# Patient Record
Sex: Female | Born: 1950 | Race: White | Hispanic: No | Marital: Single | Smoking: Never smoker
Health system: Southern US, Community
[De-identification: ages and names within clinical notes are randomized; demographics above are authoritative.]

## PROBLEM LIST (undated history)

## (undated) DIAGNOSIS — F32A Depression, unspecified: Secondary | ICD-10-CM

## (undated) DIAGNOSIS — K219 Gastro-esophageal reflux disease without esophagitis: Secondary | ICD-10-CM

## (undated) DIAGNOSIS — K635 Polyp of colon: Secondary | ICD-10-CM

## (undated) DIAGNOSIS — E785 Hyperlipidemia, unspecified: Secondary | ICD-10-CM

## (undated) DIAGNOSIS — G4700 Insomnia, unspecified: Secondary | ICD-10-CM

## (undated) DIAGNOSIS — H35041 Retinal micro-aneurysms, unspecified, right eye: Secondary | ICD-10-CM

## (undated) DIAGNOSIS — M419 Scoliosis, unspecified: Secondary | ICD-10-CM

## (undated) DIAGNOSIS — M1990 Unspecified osteoarthritis, unspecified site: Secondary | ICD-10-CM

## (undated) DIAGNOSIS — K5790 Diverticulosis of intestine, part unspecified, without perforation or abscess without bleeding: Secondary | ICD-10-CM

## (undated) HISTORY — DX: Polyp of colon: K63.5

## (undated) HISTORY — DX: Depression, unspecified: F32.A

## (undated) HISTORY — DX: Diverticulosis of intestine, part unspecified, without perforation or abscess without bleeding: K57.90

## (undated) HISTORY — PX: ABDOMINAL HYSTERECTOMY: SHX81

## (undated) HISTORY — DX: Insomnia, unspecified: G47.00

## (undated) HISTORY — PX: COLONOSCOPY: SHX174

## (undated) HISTORY — DX: Scoliosis, unspecified: M41.9

## (undated) HISTORY — DX: Retinal micro-aneurysms, unspecified, right eye: H35.041

## (undated) HISTORY — DX: Gastro-esophageal reflux disease without esophagitis: K21.9

## (undated) HISTORY — DX: Unspecified osteoarthritis, unspecified site: M19.90

## (undated) HISTORY — DX: Hyperlipidemia, unspecified: E78.5

---

## 2000-12-07 HISTORY — PX: ABDOMINAL HYSTERECTOMY: SHX81

## 2002-12-07 HISTORY — PX: COLONOSCOPY: SHX174

## 2007-02-21 ENCOUNTER — Ambulatory Visit: Payer: Self-pay | Admitting: Family Medicine

## 2007-03-10 ENCOUNTER — Ambulatory Visit: Payer: Self-pay | Admitting: Gastroenterology

## 2007-03-10 LAB — CONVERTED CEMR LAB
BUN: 9 mg/dL (ref 6–23)
Basophils Absolute: 0 10*3/uL (ref 0.0–0.1)
Basophils Relative: 0.3 % (ref 0.0–1.0)
CO2: 34 meq/L — ABNORMAL HIGH (ref 19–32)
Creatinine, Ser: 0.7 mg/dL (ref 0.4–1.2)
GFR calc Af Amer: 112 mL/min
HCT: 38.3 % (ref 36.0–46.0)
Hemoglobin: 13 g/dL (ref 12.0–15.0)
Lymphocytes Relative: 26.3 % (ref 12.0–46.0)
MCHC: 34 g/dL (ref 30.0–36.0)
Monocytes Absolute: 0.3 10*3/uL (ref 0.2–0.7)
Monocytes Relative: 9.5 % (ref 3.0–11.0)
Neutro Abs: 1.8 10*3/uL (ref 1.4–7.7)
Neutrophils Relative %: 58.9 % (ref 43.0–77.0)
Potassium: 4.7 meq/L (ref 3.5–5.1)
Sodium: 143 meq/L (ref 135–145)

## 2007-04-15 ENCOUNTER — Encounter: Payer: Self-pay | Admitting: Gastroenterology

## 2007-04-15 ENCOUNTER — Ambulatory Visit: Payer: Self-pay | Admitting: Gastroenterology

## 2007-04-15 DIAGNOSIS — K219 Gastro-esophageal reflux disease without esophagitis: Secondary | ICD-10-CM

## 2007-06-27 ENCOUNTER — Ambulatory Visit: Payer: Self-pay | Admitting: Gynecology

## 2007-08-19 ENCOUNTER — Ambulatory Visit: Payer: Self-pay | Admitting: Family Medicine

## 2007-08-19 DIAGNOSIS — G4700 Insomnia, unspecified: Secondary | ICD-10-CM

## 2007-08-19 DIAGNOSIS — G2581 Restless legs syndrome: Secondary | ICD-10-CM | POA: Insufficient documentation

## 2007-08-24 ENCOUNTER — Encounter: Payer: Self-pay | Admitting: Family Medicine

## 2007-08-25 DIAGNOSIS — K5730 Diverticulosis of large intestine without perforation or abscess without bleeding: Secondary | ICD-10-CM | POA: Insufficient documentation

## 2007-09-07 ENCOUNTER — Emergency Department: Payer: Self-pay | Admitting: Emergency Medicine

## 2007-09-07 ENCOUNTER — Other Ambulatory Visit: Payer: Self-pay

## 2007-09-22 ENCOUNTER — Ambulatory Visit: Payer: Self-pay | Admitting: Family Medicine

## 2007-12-12 ENCOUNTER — Ambulatory Visit: Payer: Self-pay | Admitting: Family Medicine

## 2007-12-12 DIAGNOSIS — M545 Low back pain: Secondary | ICD-10-CM

## 2008-04-05 ENCOUNTER — Encounter: Admission: RE | Admit: 2008-04-05 | Discharge: 2008-04-05 | Payer: Self-pay | Admitting: Family Medicine

## 2008-06-07 ENCOUNTER — Encounter: Admission: RE | Admit: 2008-06-07 | Discharge: 2008-06-07 | Payer: Self-pay | Admitting: Internal Medicine

## 2009-03-16 ENCOUNTER — Ambulatory Visit: Payer: Self-pay | Admitting: Interventional Radiology

## 2009-03-16 ENCOUNTER — Emergency Department (HOSPITAL_BASED_OUTPATIENT_CLINIC_OR_DEPARTMENT_OTHER): Admission: EM | Admit: 2009-03-16 | Discharge: 2009-03-16 | Payer: Self-pay | Admitting: Emergency Medicine

## 2009-04-03 ENCOUNTER — Encounter: Admission: RE | Admit: 2009-04-03 | Discharge: 2009-04-03 | Payer: Self-pay | Admitting: Family Medicine

## 2009-05-09 ENCOUNTER — Encounter: Admission: RE | Admit: 2009-05-09 | Discharge: 2009-05-09 | Payer: Self-pay | Admitting: Family Medicine

## 2010-07-14 ENCOUNTER — Encounter (INDEPENDENT_AMBULATORY_CARE_PROVIDER_SITE_OTHER): Payer: Self-pay | Admitting: *Deleted

## 2011-01-06 NOTE — Letter (Signed)
Summary: Nadara Eaton letter  [place] at [facility]  [address] [place], [place] [address]   Phone: [phone]  Fax: [phone]       07/14/2010 MRN: [id number]  Lindsay Reyes [address] [place], [place]  [address]  Dear Ms. [facility] [place] [place] announce the retirement of Arta Silence, M.D., from full-time practice at the [facility] office effective June 05, 2010 and his plans of returning part-time.  It is important to Dr. Hetty Ely and to our practice that you understand that [facility] has seven physicians in our office for your health care needs.  We will continue to offer the same exceptional care that you have today.    Dr. Hetty Ely has spoken to many of you about his plans for retirement and returning part-time in the fall.   We will continue to work with you through the transition to schedule appointments for you in the office and meet the high standards that [facility] is committed to.   Again, it is with great pleasure that we share the news that Dr. Hetty Ely will return to [facility] at [facility] in October of 2011 with a reduced schedule.    If you have any questions, or would like to request an appointment with one of our physicians, please call us at [phone] and press the option for Scheduling an appointment.  We take pleasure in providing you with excellent patient care and look forward to seeing you at your next office visit.  Our [facility] Physicians are:  Tillman Abide, M.D. Laurita Quint, M.D. Roxy Manns, M.D. Kerby Nora, M.D. Hannah Beat, M.D. Ruthe Mannan, M.D. We proudly welcomed Raechel Ache, M.D. and Eustaquio Boyden, M.D. to the practice in July/August 2011.  Sincerely,  [facility]

## 2011-03-18 LAB — CBC
HCT: 36.9 % (ref 36.0–46.0)
Hemoglobin: 12.6 g/dL (ref 12.0–15.0)
RBC: 3.96 MIL/uL (ref 3.87–5.11)
WBC: 5.7 10*3/uL (ref 4.0–10.5)

## 2011-03-18 LAB — COMPREHENSIVE METABOLIC PANEL
ALT: 13 U/L (ref 0–35)
Alkaline Phosphatase: 79 U/L (ref 39–117)
BUN: 11 mg/dL (ref 6–23)
CO2: 25 mEq/L (ref 19–32)
Chloride: 104 mEq/L (ref 96–112)
GFR calc non Af Amer: >60 mL/min (ref >60)
Glucose, Bld: 82 mg/dL (ref 70–99)
Potassium: 3.6 mEq/L (ref 3.5–5.1)
Sodium: 139 mEq/L (ref 135–145)
Total Bilirubin: 0.9 mg/dL (ref 0.3–1.2)
Total Protein: 7.1 g/dL (ref 6.0–8.3)

## 2011-03-18 LAB — DIFFERENTIAL
Basophils Absolute: 0 10*3/uL (ref 0.0–0.1)
Basophils Relative: 1 % (ref 0–1)
Eosinophils Absolute: 0.1 10*3/uL (ref 0.0–0.7)
Monocytes Relative: 9 % (ref 3–12)
Neutro Abs: 4.3 10*3/uL (ref 1.7–7.7)
Neutrophils Relative %: 74 % (ref 43–77)

## 2011-03-18 LAB — URINALYSIS, ROUTINE W REFLEX MICROSCOPIC
Glucose, UA: NEGATIVE mg/dL
Ketones, ur: >80 mg/dL — AB
Protein, ur: NEGATIVE mg/dL
pH: 5.5 (ref 5.0–8.0)

## 2011-03-18 LAB — URINE MICROSCOPIC-ADD ON

## 2011-04-21 NOTE — Assessment & Plan Note (Signed)
Lindsay Reyes, Lindsay Reyes NO.:  [id number]   MEDICAL RECORD NO.:  [id number]          PATIENT TYPE:  POB   LOCATION:  [facility] at [facility]         FACILITY:  [facility]   PHYSICIAN:  Ginger Carne, MD DATE OF BIRTH:  May 02, 1951   DATE OF SERVICE:  06/27/2007                                  CLINIC NOTE   HISTORY OF PRESENT ILLNESS:  The patient is a 60 year old white female  who presents today with complaints of hot flashes. The patient's history  is significant for a total hysterectomy in 2002, in which she was  started on Estradiol. The patient states that she did have a complete  hysterectomy with uterus and ovaries removed and the reason for her  hysterectomy was menorrhagia, related to fibroids. The patient has no  history of endometrial uterine cancer. She says that she stopped her  estrogen therapy approximately 3 months ago and had been fine and now  presents with hot flashes of new onset and desires to restart her  estrogen therapy.   ALLERGIES:  NO KNOWN DRUG ALLERGIES.   MEDICATIONS:  Nexium.   LABORATORY DATA:  Her last mammogram was in 2007 and was normal.   FAMILY HISTORY:  Diabetes and high blood pressure.   PAST MEDICAL HISTORY:  She personally has a history of rheumatoid  arthritis and is not currently undergoing any medical therapy or  treatment for this at this time.   SOCIAL HISTORY:  She lives alone. She is a non-smoker and socially  drinks alcohol, approximately 1 drink per week.   PHYSICAL EXAMINATION:  VITAL SIGNS:  Blood pressure 130/80, weight 142,  height 5 foot 5, pulse 79, and respiratory rate 20.  GENERAL:  A well nourished white female. Looks present stated age. In no  distress.  HEENT:  Grossly normal. No lymphadenopathy.  HEART:  Regular rate and rhythm. No murmurs.  LUNGS:  Clear bilaterally auscultation and percussion.  BREAST:  Examination within normal limits with no masses. Non-tender,  asymmetrical breast tissue.  Nipples are erect with no discharge.  ABDOMEN:  Benign with no palpable masses, non-tender. No splenomegaly.  PELVIC:  Examination was within normal limits. External genitalia tanner  5 and no visible lesions. Mucous membranes are pink with scant amount of  discharge. Pap smear was not collected secondary to history of  hysterectomy for non-cancerous reasons. Cuff was visualized on speculum  examination and felt on bimanual examination.  EXTREMITIES:  Warm to touch with equal pedal pulses. No calf pain.   ASSESSMENT/PLAN:  A 60 year old nulliparous woman who presents today for  a well-woman examination. She has menopausal symptoms of hot flashes,  which are now being treated with Estradiol 1 mg p.o. b.i.d. The patient  was scheduled for mammogram and baseline DEXA scan and baseline labs for  fasting total lipids and CBC were also ordered. The patient is also  instructed to begin calcium 500 mg p.o. daily. Dr. Mia Creek was present  during the questioning and history intake of this patient and is aware  of her examination findings and has directed her initiation of hormone  therapy. The patient will followup at [facility] for recheck of  symptoms and also  of her blood pressure check in 2 months. The patient's  vital signs were stable.     ______________________________  Maylon Cos, CNM    ______________________________  Ginger Carne, MD    SS/MEDQ  D:  06/27/2007  T:  06/27/2007  Job:  [id number]

## 2011-04-24 NOTE — Assessment & Plan Note (Signed)
[facility]                           [facility] OFFICE NOTE   Lindsay Reyes, Lindsay Reyes                       MRN:          [id number]  DATE:02/21/2007                            DOB:          23-Jun-1951    Lindsay Reyes is a 60 year old white female who comes to establish with  the practice with question of need of EGD.   She indicates that she has had nausea, vomiting, mid-epigastric pain  only for the past several years.  It however, has been getting worse.  Vomiting is new.  She has been taking Protonix in the past, but has  stopped.  With review of her diet findings she uses decaf soda, rare  coffee, no tea, chocolate 1 time every other week.   She moved to the area from [place] in January 2008.   CURRENT MEDICATIONS:  Focus factor OTC.   No known drug allergies.   PAST MEDICAL HISTORY:  Is positive for diverticulitis, requiring  hospitalization in 2003.  Otherwise she has been well with no other  acute or chronic illnesses per the patient.   SURGERIES:  Have included a total hysterectomy in 2002 with no  indication of cancer.  Tonsillectomy as a child.  She has been  hospitalized for her diverticulitis and surgeries only.   SOCIAL HISTORY:  She is single, has no children and works for a [employer]. She works 3rd shift and has moved several times  with the company.  She is in no exercise program.  Quit smoking in 1998,  and smoked only for 5 years.  No alcohol or street drugs.   REVIEW OF SYSTEMS:  Is basically negative including cardiovascular,  respiratory, GU. HEENT:  She indicates occasional headache, for which  she uses Tylenol.  Otherwise is negative.  She does use OTC reading  glasses.  GI:  Is negative except for her epigastric pain.  She has had  no transfusions or tattoos.  She had a colonoscopy 7 to 8 years ago in  [place] due to her diverticulitis.  GU:  No history of renal stones.  One  urinary tract infection  years ago.  She is a nullipara, her last  mammogram  was approximately December 2007 which was negative.  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  She had a fracture of her left wrist as a child.  She has  allergic rhinitis.   FAMILY HISTORY:  Father died, the reason is not known.  He had alcohol  abuse.  He was estranged from the family.  Mother is living at age 75  with hypertension.  She has had a CABG, is diabetic and hyperlipidemic.  She has 2 sisters, 1 with alcohol abuse, the other with no known medical  problems.   Questions regarding the extended family find no history of  cardiovascular or diabetes, cancer in the family.  There is alcohol  abuse on the father's side.   IMMUNIZATION RECORD:  Her last tetanus was approximately 3 years ago.  She has not had the hepatitis C or Pneumonia  vaccine.   PHYSICAL EXAMINATION:  Blood pressure 112/77.  Pulse is 85.  Temperature  98.1. Height 5 feet 4-1/2 inches.  Weight 132.  GENERAL:  Thin, white female in no acute distress.  CHEST:  Is clear throughout.  No rales, rhonchi, or wheezes.  HEART:  Rate and rhythm regular without murmurs, gallops, rubs.  No  carotid bruits.  No pretibial edema.  ABDOMEN:  Is soft and flat without masses.  She has tenderness in the  mid epigastric region only.  No rebound referred or referred rebound.  Bowel sounds are quiet.  MUSCULOSKELETAL:  She has full range of motion throughout.  No obvious  degenerative changes noted.  Gait and station within normal limits.  SKIN:  Is without obvious lesions in the exposed areas.  PSYCHIATRIC:  Oriented x3.  Verbalizes easily.   ASSESSMENT:  Mid-epigastric pain. I have asked her to restart her  Protonix 40 mg one daily and samples were given.  We will refer her to  gastrointestinal for further evaluation.  Discussed with her the need  for Pap smear if not done within the last 1 to 2 years; however she  declined, due to her  total hysterectomy.  We will see her back on an as  needed basis.      Lindsay D. Bean, Lindsay Reyes  Electronically Signed      Arta Silence, MD  Electronically Signed   BDB/MedQ  DD: 02/28/2007  DT: 02/28/2007  Job #: [id number]

## 2011-04-24 NOTE — Assessment & Plan Note (Signed)
[facility]                         GASTROENTEROLOGY OFFICE NOTE   PARKER, SAWATZKY                       MRN:          [id number]  DATE:03/10/2007                            DOB:          05-26-1951    REFERRING PHYSICIAN:  Billie D. Bean, FNP   REASON FOR REFERRAL:  Nausea, vomiting, and reflux symptoms.   HISTORY OF PRESENT ILLNESS:  Lindsay Reyes is a 60 year old black female  referred through the courtesy of Billie D. Bean, FNP.  Lindsay Reyes  relates intermittent problems with heartburn and indigestion over the  past 10 years.  Her symptoms have steadily worsened over the past few  years, but still tend to be intermittent.  She can go for a week or 2  without symptoms and have several days of significant symptoms.  She has  had worsening problems with nausea and recently this has progressed in  severity and she has had several episodes of vomiting.  She also notes  solid food dysphagia.  She was prescribed Nexium about 5 years ago,  which helped her symptoms.  She was also recommended to use Protonix  about 1 year ago while living in [place], but she did not understand the  medicine was for long-term usage and only used it on a temporary basis.  She states she underwent an abdominal ultrasound in 2007 in [place] for  evaluation of the same symptoms, and it was unremarkable.  She has had  intermittent episodes of diarrhea over the years.  These symptoms have  not substantially changed.  She was hospitalized in [place] in 2003 with  acute diverticulitis.  She states she underwent a colonoscopy in [place]  in 2001 that was unremarkable.  Both her sister and mother have had  colon polyps.  No other family members with colon polyps, colon cancer,  or inflammatory bowel disease.  Her weight is stable.  Her appetite is  good.  She notes no hematochezia, change in stool caliber, hematemesis,  odynophagia, or weight loss.   PAST MEDICAL HISTORY:   Arthritis, GERD, diverticulitis, status post  hysterectomy in 2002.   CURRENT MEDICATIONS:  Aspirin daily p.r.n.   MEDICATION ALLERGIES:  None known.   SOCIAL HISTORY AND REVIEW OF SYSTEMS:  Per the hand-written form.   PHYSICAL EXAM:  Well-developed, in no acute distress.  Height 5 feet 4 inches, weight 137.6 pounds.  Blood pressure is 114/70,  pulse 88 and regular.  HEENT:  Anicteric sclerae.  Oropharynx clear.  CHEST:  Clear to auscultation bilaterally.  CARDIAC:  Regular rate and rhythm without murmurs appreciated.  ABDOMEN:  Soft, nontender, and nondistended.  Normoactive bowel sounds.  No palpable organomegaly, masses, or hernias.  NEUROLOGIC:  Alert and oriented x3.  Grossly nonfocal.   ASSESSMENT AND PLAN:  1. Chronic gastroesophageal reflux disease with new-onset intermittent      solid food dysphagia and worsening nausea and vomiting.  In      addition, she has noted some intermittent mild epigastric pain.      Rule out esophagitis and peptic strictures.  Will obtain a CBC,  BMET, hepatic function panel, and lipase today.  Begin Protonix 40      mg p.o. q. a.m. along with standard antireflux measures.  Risks,      benefits, and alternatives to upper endoscopy with possibly biopsy      and possible dilation discussed with the patient and she consents      to proceed.  This will be scheduled electively.  2. History of diverticulitis.  She is to maintain a long-term high      fiber diet with adequate fluid intake.  3. Two first degree relatives with colon polyps.  Will discuss      colonoscopy with her when her upper gastrointestinal complaints      have been addressed and are under good control.     Lindsay Lick. Russella Dar, MD, [facility]  Electronically Signed    MTS/MedQ  DD: 03/10/2007  DT: 03/10/2007  Job #: [id number]   cc:   Billie D. Bean, FNP

## 2013-09-10 ENCOUNTER — Observation Stay (HOSPITAL_COMMUNITY)
Admission: EM | Admit: 2013-09-10 | Discharge: 2013-09-12 | Disposition: A | Discharge status: Home / Self Care | Payer: BC Managed Care – PPO | Attending: Internal Medicine | Admitting: Internal Medicine

## 2013-09-10 ENCOUNTER — Emergency Department (HOSPITAL_COMMUNITY): Payer: BC Managed Care – PPO

## 2013-09-10 ENCOUNTER — Encounter (HOSPITAL_COMMUNITY): Payer: Self-pay | Admitting: Emergency Medicine

## 2013-09-10 DIAGNOSIS — G4700 Insomnia, unspecified: Secondary | ICD-10-CM

## 2013-09-10 DIAGNOSIS — M545 Low back pain: Secondary | ICD-10-CM

## 2013-09-10 DIAGNOSIS — R110 Nausea: Secondary | ICD-10-CM | POA: Insufficient documentation

## 2013-09-10 DIAGNOSIS — Z7982 Long term (current) use of aspirin: Secondary | ICD-10-CM | POA: Insufficient documentation

## 2013-09-10 DIAGNOSIS — K219 Gastro-esophageal reflux disease without esophagitis: Secondary | ICD-10-CM

## 2013-09-10 DIAGNOSIS — R071 Chest pain on breathing: Principal | ICD-10-CM | POA: Insufficient documentation

## 2013-09-10 DIAGNOSIS — Z79899 Other long term (current) drug therapy: Secondary | ICD-10-CM | POA: Insufficient documentation

## 2013-09-10 DIAGNOSIS — G2581 Restless legs syndrome: Secondary | ICD-10-CM

## 2013-09-10 DIAGNOSIS — R1013 Epigastric pain: Secondary | ICD-10-CM | POA: Insufficient documentation

## 2013-09-10 DIAGNOSIS — R079 Chest pain, unspecified: Secondary | ICD-10-CM

## 2013-09-10 DIAGNOSIS — K5730 Diverticulosis of large intestine without perforation or abscess without bleeding: Secondary | ICD-10-CM

## 2013-09-10 DIAGNOSIS — E440 Moderate protein-calorie malnutrition: Secondary | ICD-10-CM | POA: Insufficient documentation

## 2013-09-10 LAB — CBC WITH DIFFERENTIAL/PLATELET
Basophils Absolute: 0 10*3/uL (ref 0.0–0.1)
Eosinophils Absolute: 0.2 10*3/uL (ref 0.0–0.7)
Eosinophils Relative: 4 % (ref 0–5)
Lymphocytes Relative: 24 % (ref 12–46)
MCV: 90.6 fL (ref 78.0–100.0)
Neutrophils Relative %: 59 % (ref 43–77)
Platelets: 156 10*3/uL (ref 150–400)
RDW: 11.6 % (ref 11.5–15.5)
WBC: 3.6 10*3/uL — ABNORMAL LOW (ref 4.0–10.5)

## 2013-09-10 LAB — COMPREHENSIVE METABOLIC PANEL
ALT: 11 U/L (ref 0–35)
AST: 22 U/L (ref 0–37)
Calcium: 9.7 mg/dL (ref 8.4–10.5)
Potassium: 3.2 mEq/L — ABNORMAL LOW (ref 3.5–5.1)
Sodium: 139 mEq/L (ref 135–145)
Total Protein: 6.8 g/dL (ref 6.0–8.3)

## 2013-09-10 LAB — POCT I-STAT TROPONIN I

## 2013-09-10 LAB — MRSA PCR SCREENING: MRSA by PCR: POSITIVE — AB

## 2013-09-10 LAB — TROPONIN I: Troponin I: <0.3 ng/mL (ref <0.30)

## 2013-09-10 MED ORDER — POTASSIUM CHLORIDE CRYS ER 20 MEQ PO TBCR
40.0000 meq | EXTENDED_RELEASE_TABLET | Freq: Once | ORAL | Status: AC
  Administered 2013-09-10: 40 meq via ORAL
  Filled 2013-09-10: qty 2

## 2013-09-10 MED ORDER — ASPIRIN 81 MG PO CHEW
324.0000 mg | CHEWABLE_TABLET | Freq: Once | ORAL | Status: AC
  Administered 2013-09-10: 324 mg via ORAL
  Filled 2013-09-10: qty 4

## 2013-09-10 MED ORDER — ADULT MULTIVITAMIN W/MINERALS CH
1.0000 | ORAL_TABLET | Freq: Every day | ORAL | Status: DC
  Administered 2013-09-10 – 2013-09-12 (×3): 1 via ORAL
  Filled 2013-09-10 (×3): qty 1

## 2013-09-10 MED ORDER — IBUPROFEN 600 MG PO TABS
600.0000 mg | ORAL_TABLET | ORAL | Status: DC | PRN
  Administered 2013-09-11 – 2013-09-12 (×2): 600 mg via ORAL
  Filled 2013-09-10 (×3): qty 1

## 2013-09-10 MED ORDER — SODIUM CHLORIDE 0.9 % IV BOLUS (SEPSIS)
1000.0000 mL | Freq: Once | INTRAVENOUS | Status: AC
  Administered 2013-09-10: 1000 mL via INTRAVENOUS

## 2013-09-10 MED ORDER — ASPIRIN EC 81 MG PO TBEC
81.0000 mg | DELAYED_RELEASE_TABLET | Freq: Every day | ORAL | Status: DC
  Administered 2013-09-11 – 2013-09-12 (×2): 81 mg via ORAL
  Filled 2013-09-10 (×3): qty 1

## 2013-09-10 MED ORDER — IMIPRAMINE HCL 50 MG PO TABS
50.0000 mg | ORAL_TABLET | Freq: Every day | ORAL | Status: DC
  Administered 2013-09-10 – 2013-09-11 (×2): 50 mg via ORAL
  Filled 2013-09-10 (×3): qty 1

## 2013-09-10 MED ORDER — MORPHINE SULFATE 4 MG/ML IJ SOLN
4.0000 mg | Freq: Once | INTRAMUSCULAR | Status: AC
  Administered 2013-09-10: 4 mg via INTRAVENOUS
  Filled 2013-09-10: qty 1

## 2013-09-10 MED ORDER — HEPARIN SODIUM (PORCINE) 5000 UNIT/ML IJ SOLN
5000.0000 [IU] | Freq: Three times a day (TID) | INTRAMUSCULAR | Status: DC
  Administered 2013-09-10 – 2013-09-11 (×2): 5000 [IU] via SUBCUTANEOUS
  Filled 2013-09-10 (×5): qty 1

## 2013-09-10 MED ORDER — ACETAMINOPHEN 650 MG RE SUPP: 650.0000 mg | Freq: Four times a day (QID) | RECTAL | Status: DC | PRN

## 2013-09-10 MED ORDER — NITROGLYCERIN 0.4 MG SL SUBL: 0.4000 mg | SUBLINGUAL_TABLET | SUBLINGUAL | Status: DC | PRN

## 2013-09-10 MED ORDER — MORPHINE SULFATE 2 MG/ML IJ SOLN: 2.0000 mg | INTRAMUSCULAR | Status: DC | PRN

## 2013-09-10 MED ORDER — ACETAMINOPHEN 325 MG PO TABS: 650.0000 mg | ORAL_TABLET | Freq: Four times a day (QID) | ORAL | Status: DC | PRN

## 2013-09-10 MED ORDER — LORAZEPAM 1 MG PO TABS
2.0000 mg | ORAL_TABLET | Freq: Every day | ORAL | Status: DC
  Administered 2013-09-10 – 2013-09-11 (×2): 2 mg via ORAL
  Filled 2013-09-10 (×2): qty 2

## 2013-09-10 MED ORDER — ONDANSETRON HCL 4 MG/2ML IJ SOLN
4.0000 mg | Freq: Once | INTRAMUSCULAR | Status: AC
  Administered 2013-09-10: 4 mg via INTRAVENOUS
  Filled 2013-09-10: qty 2

## 2013-09-10 MED ORDER — IBUPROFEN 800 MG PO TABS
800.0000 mg | ORAL_TABLET | Freq: Once | ORAL | Status: AC
  Administered 2013-09-10: 800 mg via ORAL
  Filled 2013-09-10: qty 1

## 2013-09-10 NOTE — Progress Notes (Signed)
During my admission assessment, the patient informed me of a history of MRSA infection on her skin. Per protocol I performed a MRSA swab on her nose. [facility] lab has just called me to inform me it was positive for MRSA. I will initiate the MRSA + protocol. Patient aware of plan.

## 2013-09-10 NOTE — H&P (Signed)
[facility] History and Physical  Kelani Robart EAV:[id number] DOB: [birth year] DOA: 09/10/2013  Referring physician: Emergency Department PCP: Crawford Givens, MD  Specialists:   Chief Complaint: Chest pain  HPI: Lindsay Reyes is a 62 y.o. female  With no significant medical history who presents with pleuritic chest pain. CE neg x1 in the ED. No relief with NTG or morphine. Hospitalist consulted by ED for admission for chest pain. Pain is very reproducible and localized over L chest. Does note some nausea but no vomiting. No diaphoresis.  Review of Systems: Per above, remainder of 10pt ros reviewed and are neg  History reviewed. No pertinent past medical history. Past Surgical History  Procedure Laterality Date  . Abdominal hysterectomy     Social History:  reports that she has never smoked. She does not have any smokeless tobacco history on file. She reports that  drinks alcohol. She reports that she does not use illicit drugs.  where does patient live--home, ALF, SNF? and with whom if at home?  Can patient participate in ADLs?  No Known Allergies  No family history on file.  (be sure to complete)  Prior to Admission medications   Medication Sig Start Date End Date Taking? Authorizing Provider  aspirin EC 81 MG tablet Take 81 mg by mouth daily.   Yes Historical Provider, MD  imipramine (TOFRANIL) 25 MG tablet Take 50 mg by mouth at bedtime.   Yes Historical Provider, MD  LORazepam (ATIVAN) 1 MG tablet Take 2 mg by mouth at bedtime.   Yes Historical Provider, MD  Multiple Vitamin (MULTIVITAMIN WITH MINERALS) TABS tablet Take 1 tablet by mouth daily.   Yes Historical Provider, MD   Physical Exam: Filed Vitals:   09/10/13 1312 09/10/13 1755  BP: 125/82 134/82  Pulse: 86 85  Temp: 97.9 F (36.6 C)   TempSrc: Oral   Resp: 16 15  SpO2: 100% 100%     General:  Awake, in nad  Eyes: PERRL B  ENT: membranes moist, dentition fair  Neck: trachea midline, neck  supple  Cardiovascular: regular, s1, s2, reproducible chest pain over L chest, below breast  Respiratory: normal resp effort, no wheezing  Abdomen: soft, nondistended  Skin: normal skin turgor, no abnormal skin lesions seen  Musculoskeletal: perfused, no clubbing or cyanosis  Psychiatric: mood/affect normal // no auditory/visusal hallucinations  Neurologic: cn2-12 grossly intact, strength/sensation intacte  Labs on Admission:  Basic Metabolic Panel:  Recent Labs Lab 09/10/13 1330  NA 139  K 3.2*  CL 103  CO2 27  GLUCOSE 97  BUN 15  CREATININE 0.92  CALCIUM 9.7   Liver Function Tests:  Recent Labs Lab 09/10/13 1330  AST 22  ALT 11  ALKPHOS 70  BILITOT 0.4  PROT 6.8  ALBUMIN 3.7   No results found for this basename: LIPASE, AMYLASE,  in the last 168 hours No results found for this basename: AMMONIA,  in the last 168 hours CBC:  Recent Labs Lab 09/10/13 1330  WBC 3.6*  NEUTROABS 2.2  HGB 12.4  HCT 36.7  MCV 90.6  PLT 156   Cardiac Enzymes: No results found for this basename: CKTOTAL, CKMB, CKMBINDEX, TROPONINI,  in the last 168 hours  BNP (last 3 results) No results found for this basename: PROBNP,  in the last 8760 hours CBG: No results found for this basename: GLUCAP,  in the last 168 hours  Radiological Exams on Admission: Dg Chest 2 View  09/10/2013   *RADIOLOGY REPORT*  Clinical Data: Chest  pain for 2 weeks, initial encounter.  CHEST - 2 VIEW  Comparison: 03/24/2011  Findings: Grossly unchanged cardiac silhouette and mediastinal contours.  No focal airspace opacity.  No pleural effusion or pneumothorax.  No evidence of edema.  Grossly unchanged bones including moderate scoliotic curvature of the thoracolumbar spine.  IMPRESSION: 1.  No acute cardiopulmonary disease. 2.  Grossly unchanged moderate scoliotic curvature of the thoracolumbar spine.   Original Report Authenticated By: Tacey Ruiz, MD    EKG: Independently reviewed.  NSR  Assessment/Plan Principal Problem:   Chest pain   1. Chest pain 1. Admit obs 2. Follow serial enzymes 3. Check 2d echo for WMA 4. Anaglesics as needed 2. DVT prophylaxis 1. Heparin subQ  Code Status: Full (must indicate code status--if unknown or must be presumed, indicate so) Family Communication: Pt in room (indicate person spoken with, if applicable, with phone number if by telephone) Disposition Plan: Pending (indicate anticipated LOS)  Time spent:  CHIU, STEPHEN K [facility] Pager [phone]  If 7PM-7AM, please contact night-coverage [email or web address] Password [facility] 09/10/2013, 7:38 PM

## 2013-09-10 NOTE — ED Notes (Signed)
Pt from home reporting CP x1 week with some SOB. Pt states that CP was different and hurt worse today, with difficulty taking a deep breath. Pt denies, N/V, diaphoresis, cardia hx. Pt is A&O and in NAD

## 2013-09-10 NOTE — ED Provider Notes (Signed)
CSN: [id number]     Arrival date & time 09/10/13  1303 History   First MD Initiated Contact with Patient 09/10/13 1318     Chief Complaint  Patient presents with  . Chest Pain  . Shortness of Breath   (Consider location/radiation/quality/duration/timing/severity/associated sxs/prior Treatment) HPI Comments: Patient presents with a chief complaint of chest pain and SOB.  Pain located in the substernal area and just below the left breast.  Pain does not radiate.  She reports that she has been having the chest pain for years, but the pain has been worse over the past week.  She reports that the pain has been constant over the past week.   Pain worse with taking a deep breath and also with movement.  She states that she has not noticed any worsening of the pain with exertion or eating.  She reports that she does have some associated nausea, but no vomiting.  She reports that she does have some associated shortness of breath.  She denies fever or chills.  She reports that she has had a cough for the past year.  She denies any prior cardiac history.  Denies history of HTN, Hyperlipidemia, or DM.  She does not smoke and does not have any history of smoking.  She reports that her mother had a CABG when she was in her 65's.  She denies any history of prolonged travel or surgeries in the past 4 weeks.  Denies any history of DVT or PE.  Denies lower extremity edema.  No use of hormone replacement therapy.    She reports that she has never had any prior cardiac testing.  The history is provided by the patient.    History reviewed. No pertinent past medical history. Past Surgical History  Procedure Laterality Date  . Abdominal hysterectomy     No family history on file. History  Substance Use Topics  . Smoking status: Never Smoker   . Smokeless tobacco: Not on file  . Alcohol Use: Yes     Comment: socially   OB History   Grav Para Term Preterm Abortions TAB SAB Ect Mult Living                  Review of Systems  All other systems reviewed and are negative.    Allergies  Review of patient's allergies indicates no known allergies.  Home Medications   Current Outpatient Rx  Name  Route  Sig  Dispense  Refill  . aspirin EC 81 MG tablet   Oral   Take 81 mg by mouth daily.         Marland Kitchen imipramine (TOFRANIL) 25 MG tablet   Oral   Take 50 mg by mouth at bedtime.         Marland Kitchen LORazepam (ATIVAN) 1 MG tablet   Oral   Take 2 mg by mouth at bedtime.         . Multiple Vitamin (MULTIVITAMIN WITH MINERALS) TABS tablet   Oral   Take 1 tablet by mouth daily.          BP 125/82  Pulse 86  Temp(Src) 97.9 F (36.6 C) (Oral)  Resp 16  SpO2 100% Physical Exam  Nursing note and vitals reviewed. Constitutional: She appears well-developed and well-nourished.  HENT:  Head: Normocephalic and atraumatic.  Mouth/Throat: Oropharynx is clear and moist.  Neck: Normal range of motion. Neck supple.  Cardiovascular: Normal rate, regular rhythm, normal heart sounds and intact distal pulses.  Pulmonary/Chest: Effort normal and breath sounds normal. No respiratory distress. She has no wheezes. She has no rales. She exhibits no tenderness.  Abdominal: Soft. Bowel sounds are normal. She exhibits no distension and no mass. There is no tenderness. There is no rebound and no guarding.  Musculoskeletal: Normal range of motion.  No lower extremity edema or erythema bilaterally  Neurological: She is alert. No sensory deficit.  Skin: Skin is warm and dry.  Psychiatric: She has a normal mood and affect.    ED Course  Procedures (including critical care time) Labs Review Labs Reviewed  CBC WITH DIFFERENTIAL - Abnormal; Notable for the following:    WBC 3.6 (*)    All other components within normal limits  COMPREHENSIVE METABOLIC PANEL  D-DIMER, QUANTITATIVE  POCT I-STAT TROPONIN I   Imaging Review Dg Chest 2 View  09/10/2013   *RADIOLOGY REPORT*  Clinical Data: Chest pain for 2  weeks, initial encounter.  CHEST - 2 VIEW  Comparison: 03/24/2011  Findings: Grossly unchanged cardiac silhouette and mediastinal contours.  No focal airspace opacity.  No pleural effusion or pneumothorax.  No evidence of edema.  Grossly unchanged bones including moderate scoliotic curvature of the thoracolumbar spine.  IMPRESSION: 1.  No acute cardiopulmonary disease. 2.  Grossly unchanged moderate scoliotic curvature of the thoracolumbar spine.   Original Report Authenticated By: Tacey Ruiz, MD    Patient discussed with Dr. Micheline Maze who also evaluated the patient.   Date: 09/10/2013  Rate: 86  Rhythm: normal sinus rhythm  QRS Axis: normal  Intervals: normal  ST/T Wave abnormalities: nonspecific T wave changes  Conduction Disutrbances:none  Narrative Interpretation:   Old EKG Reviewed: none available  Patient discussed with Dr. Micheline Maze who also evaluated the patient.    3:37 PM Patient signed out to [employer], PA-C.  Patient awaiting second troponin.    MDM  No diagnosis found. Patient presents with a chief complaint of chest pain that has been constant for a week.  Pain worse with deep breaths and with movement.  No prior cardiac history.  Labs unremarkable.  Initial troponin negative.  D-dimer negative.  CXR negative.  EKG with t wave inversions.  No prior EKG available for comparison.  Patient awaiting the second troponin.  Due to the fact that the pain has been constant for the past week in this low risk patient feel that she can be discharged home if the second troponin is negative.      Pascal Lux Eaton, PA-C 09/10/13 1537

## 2013-09-10 NOTE — ED Notes (Signed)
Bed: WA07 Expected date: 09/10/13 Expected time:  Means of arrival:  Comments: Lindsay Reyes

## 2013-09-10 NOTE — ED Notes (Signed)
Called lab to inquire about CMP results, they stated there are having issues with analyzer which caused delay and they are currently working to resolve this problem.

## 2013-09-10 NOTE — ED Provider Notes (Signed)
Medical screening examination/treatment/procedure(s) were conducted as a shared visit with non-physician practitioner(s) and myself.  I personally evaluated the patient during the encounter  Pt well-appearing on exam, in NAD.  Pain worse w/ mvmt. Cardiopulm exam benign.  CP is atypical. No PE risk factors and d-dimer negative. Plan for delta troponin and outpt f/u.  Pt is low risk for MACE using HEART score.   Shanna Cisco, MD 09/10/13 [phone]

## 2013-09-10 NOTE — ED Provider Notes (Signed)
Discussed case with Doran Durand, PA-C - transfer of care from [facility] [place] PA-C at change in shift. Recommended that chest pain be controlled and second troponin be performed.  5:23 PM Patient seen and are-assessed by this provider. Patient reported that she has never experiencing chest pain before. Stated that she has been experiencing chest pain over the past week, reported that the pain is localized to the left side of the chest described as a sharp pain with intermittent radiation to the right shoulder blade and back. Stated that the pain is mainly localized to the center of the chest. Patient reported that the pain worsens when she takes a deep exhale - stated that the pain is a 10/10. Stated that the pain is a 2-3/10 when she lays still. Denied HTN, DM, smoking. Denied dizziness, shortness of breath, cough, fever, chills, hemoptysis, abdominal pain, nausea, vomiting.  Patient has been given morphine and ASA 325 mg - patient reported that the morphine did not touch her pain.  Alert and oriented. Patient found sitting upright in bed. Lungs clear to auscultation bilaterally. Heart rate and rhythm normal. Pulses palpable and strong, distal and proximal. Pain reproducible upon palpation to the chest wall. Cap refill < 3 seconds. Negative swelling and erythema noted to the legs, ankles and feet - negative pitting edema noted.  Discussed with patient that a second troponin level is to be obtained.   Results for orders placed during the hospital encounter of 09/10/13  CBC WITH DIFFERENTIAL      Result Value Range   WBC 3.6 (*) 4.0 - 10.5 K/uL   RBC 4.05  3.87 - 5.11 MIL/uL   Hemoglobin 12.4  12.0 - 15.0 g/dL   HCT 16.1  09.6 - 04.5 %   MCV 90.6  78.0 - 100.0 fL   MCH 30.6  26.0 - 34.0 pg   MCHC 33.8  30.0 - 36.0 g/dL   RDW 40.9  81.1 - 91.4 %   Platelets 156  150 - 400 K/uL   Neutrophils Relative % 59  43 - 77 %   Neutro Abs 2.2  1.7 - 7.7 K/uL   Lymphocytes Relative 24  12 - 46 %   Lymphs Abs 0.9  0.7 - 4.0 K/uL   Monocytes Relative 12  3 - 12 %   Monocytes Absolute 0.4  0.1 - 1.0 K/uL   Eosinophils Relative 4  0 - 5 %   Eosinophils Absolute 0.2  0.0 - 0.7 K/uL   Basophils Relative 1  0 - 1 %   Basophils Absolute 0.0  0.0 - 0.1 K/uL  COMPREHENSIVE METABOLIC PANEL      Result Value Range   Sodium 139  135 - 145 mEq/L   Potassium 3.2 (*) 3.5 - 5.1 mEq/L   Chloride 103  96 - 112 mEq/L   CO2 27  19 - 32 mEq/L   Glucose, Bld 97  70 - 99 mg/dL   BUN 15  6 - 23 mg/dL   Creatinine, Ser 7.82  0.50 - 1.10 mg/dL   Calcium 9.7  8.4 - 95.6 mg/dL   Total Protein 6.8  6.0 - 8.3 g/dL   Albumin 3.7  3.5 - 5.2 g/dL   AST 22  0 - 37 U/L   ALT 11  0 - 35 U/L   Alkaline Phosphatase 70  39 - 117 U/L   Total Bilirubin 0.4  0.3 - 1.2 mg/dL   GFR calc non Af Amer 66 (*) >90 mL/min  GFR calc Af Amer 76 (*) >90 mL/min  D-DIMER, QUANTITATIVE      Result Value Range   D-Dimer, Quant <0.27  0.00 - 0.48 ug/mL-FEU  POCT I-STAT TROPONIN I      Result Value Range   Troponin i, poc 0.00  0.00 - 0.08 ng/mL   Comment 3             Date: 09/10/2013  Rate: 86  Rhythm: normal sinus rhythm  QRS Axis: normal  Intervals: normal  ST/T Wave abnormalities: nonspecific T wave changes inferior leads  Conduction Disutrbances:none  Narrative Interpretation:   Old EKG Reviewed: none available EKG analyzed and reviewed by this provider and attending physician.    Dg Chest 2 View  09/10/2013   *RADIOLOGY REPORT*  Clinical Data: Chest pain for 2 weeks, initial encounter.  CHEST - 2 VIEW  Comparison: 03/24/2011  Findings: Grossly unchanged cardiac silhouette and mediastinal contours.  No focal airspace opacity.  No pleural effusion or pneumothorax.  No evidence of edema.  Grossly unchanged bones including moderate scoliotic curvature of the thoracolumbar spine.  IMPRESSION: 1.  No acute cardiopulmonary disease. 2.  Grossly unchanged moderate scoliotic curvature of the thoracolumbar spine.   Original  Report Authenticated By: Tacey Ruiz, MD   CBC negative findings noted. CMP mild hypokalemia, potassium PO given. D-dimer negative elevation. First troponin negative elevation. Chest xray negative findings.   6:59 PM Spoke with Dr. Rhona Leavens, [facility], patient to be admitted for cardiac rule out. Physician recommended NSAIDs to be iven for possible costochondritis.   7:04 PM Discussed labs and imaging results with patient. Patient continues to have chest pain - discussed concern. Discussed with patient that she is to be admitted to the hospital for cardiac rule out. Patient understood and agreed to plan of care.   Due to chest pain presentation , chest pain not controlled in ED setting with morphine, nitroglycerin and ASA - patient to be admitted to the hospital for cardiac rule out. Discussed case with attending who agreed to plan for admission. Discussed case with Dr. Rhona Leavens, [facility], patient to be admitted to Telemetry observation. Discussed with patient that she is to be admitted - patient agreed and understood. Patient stable for transfer.   Raymon Mutton, PA-C 09/12/13 [phone]

## 2013-09-11 ENCOUNTER — Observation Stay (HOSPITAL_COMMUNITY): Payer: BC Managed Care – PPO

## 2013-09-11 DIAGNOSIS — I059 Rheumatic mitral valve disease, unspecified: Secondary | ICD-10-CM

## 2013-09-11 DIAGNOSIS — K219 Gastro-esophageal reflux disease without esophagitis: Secondary | ICD-10-CM

## 2013-09-11 DIAGNOSIS — G2581 Restless legs syndrome: Secondary | ICD-10-CM

## 2013-09-11 DIAGNOSIS — E440 Moderate protein-calorie malnutrition: Secondary | ICD-10-CM | POA: Insufficient documentation

## 2013-09-11 DIAGNOSIS — K5730 Diverticulosis of large intestine without perforation or abscess without bleeding: Secondary | ICD-10-CM

## 2013-09-11 LAB — COMPREHENSIVE METABOLIC PANEL
AST: 18 U/L (ref 0–37)
BUN: 13 mg/dL (ref 6–23)
CO2: 28 mEq/L (ref 19–32)
Chloride: 105 mEq/L (ref 96–112)
Creatinine, Ser: 0.9 mg/dL (ref 0.50–1.10)
GFR calc Af Amer: 78 mL/min — ABNORMAL LOW (ref >90)
GFR calc non Af Amer: 68 mL/min — ABNORMAL LOW (ref >90)
Glucose, Bld: 100 mg/dL — ABNORMAL HIGH (ref 70–99)
Potassium: 3.7 mEq/L (ref 3.5–5.1)
Total Bilirubin: 0.3 mg/dL (ref 0.3–1.2)
Total Protein: 6 g/dL (ref 6.0–8.3)

## 2013-09-11 LAB — CBC
HCT: 33.8 % — ABNORMAL LOW (ref 36.0–46.0)
MCH: 30 pg (ref 26.0–34.0)
MCHC: 32.8 g/dL (ref 30.0–36.0)
Platelets: 148 10*3/uL — ABNORMAL LOW (ref 150–400)
RBC: 3.7 MIL/uL — ABNORMAL LOW (ref 3.87–5.11)

## 2013-09-11 LAB — TROPONIN I
Troponin I: <0.3 ng/mL (ref <0.30)
Troponin I: <0.3 ng/mL (ref <0.30)

## 2013-09-11 MED ORDER — PANTOPRAZOLE SODIUM 40 MG IV SOLR
40.0000 mg | INTRAVENOUS | Status: DC
  Administered 2013-09-11 – 2013-09-12 (×2): 40 mg via INTRAVENOUS
  Filled 2013-09-11 (×2): qty 40

## 2013-09-11 MED ORDER — MUPIROCIN 2 % EX OINT
1.0000 "application " | TOPICAL_OINTMENT | Freq: Two times a day (BID) | CUTANEOUS | Status: DC
  Administered 2013-09-11 – 2013-09-12 (×3): 1 via NASAL
  Filled 2013-09-11: qty 22

## 2013-09-11 MED ORDER — CHLORHEXIDINE GLUCONATE CLOTH 2 % EX PADS
6.0000 | MEDICATED_PAD | Freq: Every day | CUTANEOUS | Status: DC
  Administered 2013-09-11 – 2013-09-12 (×2): 6 via TOPICAL

## 2013-09-11 NOTE — Progress Notes (Signed)
Nutrition Brief Note  Pt meets criteria for moderate MALNUTRITION in the context of social/environmental circumstances as evidenced by <75% estimated energy intake for several months with 14.8% weight loss in the past 10 months in addition to pt with mild/moderate muscle wasting and subcutaneous fat loss in clavicles, upper arms, and hands.  Patient identified on the Malnutrition Screening Tool (MST) Report  Wt Readings from Last 15 Encounters:  09/10/13 126 lb 8 oz (57.38 kg)  12/12/07 138 lb (62.596 kg)  09/22/07 139 lb (63.05 kg)  08/19/07 140 lb (63.504 kg)    Body mass index is 21.05 kg/(m^2). Patient meets criteria for normal weight based on current BMI.   Current diet order is regular, no meal intake yet recorded. Labs and medications reviewed. Met with pt who reports she has not been eating well at home r/t having braces for 1.5 years, which will be taken off in March. States when she gets the wires put in, she can only eat soups and liquids. Reports 22 pound unintended weight loss since December 2013 (14.8% weight loss). States she drinks Boost daily and tries to make smoothies and shakes when she knows she will not be able to eat as much. Discussed soft foods and high calorie/protein shakes pt can consume when her intake is limited r/t braces being tightened. Pt expressed understanding. Pt discussed during multidisciplinary rounds, likely to be d/c today. Observed pt with mild/moderate muscle wasting and subcutaneous fat loss in clavicles, upper arms, and hands.    No nutrition interventions warranted at this time. If nutrition issues arise, please consult RD.   Levon Hedger MS, RD, LDN [phone] Pager [phone] After Hours Pager

## 2013-09-11 NOTE — Progress Notes (Signed)
Echocardiogram 2D Echocardiogram has been performed.  Lindsay Reyes 09/11/2013, 9:53 AM

## 2013-09-11 NOTE — Progress Notes (Signed)
Patient claims pain is in epigastric area especially when she swallows solid food, claims it feels like food gets stuck.

## 2013-09-11 NOTE — Progress Notes (Signed)
[facility] PROGRESS NOTE  Lindsay Reyes QMV:[id number] DOB: [birth year] DOA: 09/10/2013 PCP: Crawford Givens, MD  Brief narrative: 62 y.o. female with past medical history of anxiety, GERD who presented to [facility] ED with complaints of chest pain for past few days prior to this admission. Patient reported the pain starts in the upper abdominal region and radiates to the left chest. Patient reported no relief with nitroglycerin, morphine or Advil. Pain is exacerbated with breathing. In ED, her vital signs are within normal limits. Oxygen saturation is 100% on room air, heart rate 71 and T 98.1 F. The 12-lead EKG showed no acute ischemic changes. Cardiac enzymes so far are all negative. 2-D echo shows ejection fraction 55-60%.  Assessment/Plan:  Principal Problem:   Chest pain - Acute coronary syndrome ruled out as cardiac enzymes are negative and 2-D echo with essentially normal ejection fraction. - The 12-lead EKG did not show acute ST-T wave changes - Obtain abdominal x-ray for now to evaluate possible GI etiology of pain - Start protonix 40 mg IV Q 12 hours   Code Status: Full code Family Communication: No family at the bedside Disposition Plan: Home when stable  Manson Passey, MD  [facility] Pager [phone]  If 7PM-7AM, please contact night-coverage [email or web address] Password [email or web address] 09/11/2013, 12:27 PM   LOS: 1 day   Consultants:  None  Procedures:  None  Antibiotics:  None  HPI/Subjective: Still complains of significant pain in the upper abdominal area.  Objective: Filed Vitals:   09/10/13 1312 09/10/13 1755 09/10/13 2101 09/11/13 0500  BP: 125/82 134/82 132/74 131/74  Pulse: 86 85 71 80  Temp: 97.9 F (36.6 C)  98.1 F (36.7 C) 97.9 F (36.6 C)  TempSrc: Oral  Oral Oral  Resp: 16 15 18 16   Height:   5\' 5"  (1.651 m)   Weight:   57.38 kg (126 lb 8 oz)   SpO2: 100% 100% 100% 99%    Intake/Output Summary (Last 24 hours) at 09/11/13 1227 Last data filed  at 09/11/13 0901  Gross per 24 hour  Intake    360 ml  Output      0 ml  Net    360 ml    Exam:   General:  Pt is alert, follows commands appropriately, not in acute distress  Cardiovascular: Regular rate and rhythm, S1/S2, no murmurs, no rubs, no gallops  Respiratory: Clear to auscultation bilaterally, no wheezing, no crackles, no rhonchi  Abdomen: Soft, non tender, non distended, bowel sounds present, no guarding  Extremities: No edema, pulses DP and PT palpable bilaterally  Neuro: Grossly nonfocal  Data Reviewed: Basic Metabolic Panel:  Recent Labs Lab 09/10/13 1330 09/11/13 0345  NA 139 138  K 3.2* 3.7  CL 103 105  CO2 27 28  GLUCOSE 97 100*  BUN 15 13  CREATININE 0.92 0.90  CALCIUM 9.7 9.5   Liver Function Tests:  Recent Labs Lab 09/10/13 1330 09/11/13 0345  AST 22 18  ALT 11 9  ALKPHOS 70 66  BILITOT 0.4 0.3  PROT 6.8 6.0  ALBUMIN 3.7 3.3*   No results found for this basename: LIPASE, AMYLASE,  in the last 168 hours No results found for this basename: AMMONIA,  in the last 168 hours CBC:  Recent Labs Lab 09/10/13 1330 09/11/13 0345  WBC 3.6* 3.1*  NEUTROABS 2.2  --   HGB 12.4 11.1*  HCT 36.7 33.8*  MCV 90.6 91.4  PLT 156 148*   Cardiac Enzymes:  Recent Labs Lab  09/10/13 2155 09/11/13 0345 09/11/13 1005  TROPONINI <0.30 <0.30 <0.30   BNP: No components found with this basename: POCBNP,  CBG: No results found for this basename: GLUCAP,  in the last 168 hours  MRSA PCR SCREENING     Status: Abnormal   Collection Time    09/10/13  8:51 PM      Result Value Range Status   MRSA by PCR POSITIVE (*) NEGATIVE Final     Studies: Dg Chest 2 View  09/10/2013   *RADIOLOGY REPORT*  Clinical Data: Chest pain for 2 weeks, initial encounter.  CHEST - 2 VIEW  Comparison: 03/24/2011  Findings: Grossly unchanged cardiac silhouette and mediastinal contours.  No focal airspace opacity.  No pleural effusion or pneumothorax.  No evidence of  edema.  Grossly unchanged bones including moderate scoliotic curvature of the thoracolumbar spine.  IMPRESSION: 1.  No acute cardiopulmonary disease. 2.  Grossly unchanged moderate scoliotic curvature of the thoracolumbar spine.   Original Report Authenticated By: Tacey Ruiz, MD    Scheduled Meds: . aspirin EC  81 mg Oral Daily  . Chlorhexidine Gluconate Cloth  6 each Topical Q0600  . imipramine  50 mg Oral QHS  . LORazepam  2 mg Oral QHS  . multivitamin with minerals  1 tablet Oral Daily  . mupirocin ointment  1 application Nasal BID  . pantoprazole (PROTONIX) IV  40 mg Intravenous Q24H   Continuous Infusions:

## 2013-09-11 NOTE — Care Management Note (Signed)
   CARE MANAGEMENT NOTE 09/11/2013  Patient:  Lindsay Reyes, Lindsay Reyes   Account Number:  [id number]  Date Initiated:  09/11/2013  Documentation initiated by:  Cesilia Shinn  Subjective/Objective Assessment:   62 yo female admitted with chestpain.  PCP: Crawford Givens, MD     Action/Plan:   Home when stable   Anticipated DC Date:  09/12/2013   Anticipated DC Plan:  HOME/SELF CARE      DC Planning Services  CM consult      Choice offered to / List presented to:  NA   DME arranged  NA      DME agency  NA     HH arranged  NA      HH agency  NA   Status of service:  Completed, signed off Medicare Important Message given?   (If response is "NO", the following Medicare IM given date fields will be blank) Date Medicare IM given:   Date Additional Medicare IM given:    Discharge Disposition:    Per UR Regulation:  Reviewed for med. necessity/level of care/duration of stay  If discussed at Long Length of Stay Meetings, dates discussed:    Comments:  09/11/13 1527 Lakeisha Waldrop,RN,MSN [phone] Chart reviewed for utilization of services. No needs identified at this time. PTA pt independent.

## 2013-09-12 ENCOUNTER — Encounter (HOSPITAL_COMMUNITY): Payer: Self-pay | Admitting: Radiology

## 2013-09-12 ENCOUNTER — Observation Stay (HOSPITAL_COMMUNITY): Payer: BC Managed Care – PPO

## 2013-09-12 DIAGNOSIS — M545 Low back pain: Secondary | ICD-10-CM

## 2013-09-12 MED ORDER — IMIPRAMINE HCL 25 MG PO TABS: 50.0000 mg | ORAL_TABLET | Freq: Every day | ORAL | Status: DC

## 2013-09-12 MED ORDER — IOHEXOL 300 MG/ML  SOLN: 25.0000 mL | INTRAMUSCULAR | Status: AC

## 2013-09-12 MED ORDER — IOHEXOL 300 MG/ML  SOLN
100.0000 mL | Freq: Once | INTRAMUSCULAR | Status: AC | PRN
  Administered 2013-09-12: 100 mL via INTRAVENOUS

## 2013-09-12 MED ORDER — HYDROCODONE-ACETAMINOPHEN 5-325 MG PO TABS: 1.0000 | ORAL_TABLET | Freq: Four times a day (QID) | ORAL | Status: DC | PRN

## 2013-09-12 MED ORDER — LORAZEPAM 1 MG PO TABS: 2.0000 mg | ORAL_TABLET | Freq: Every day | ORAL | Status: DC

## 2013-09-12 MED ORDER — FAMOTIDINE 20 MG PO TABS: 20.0000 mg | ORAL_TABLET | Freq: Two times a day (BID) | ORAL | Status: DC

## 2013-09-12 NOTE — Discharge Summary (Signed)
Physician Discharge Summary  Lindsay Reyes HYQ:[id number] DOB: September 11, 1951 DOA: 09/10/2013  PCP: Crawford Givens, MD  Admit date: 09/10/2013 Discharge date: 09/12/2013  Recommendations for Outpatient Follow-up:  1. You were hospitalized for chest/abdominal pain. Your cardiac enzymes are negative. Your echocardiogram is normal. We have also obtain CT abdomen and x ray all of which were negative. 2. Slowly advance the diet as home as tolerated 3. Follow up with PCP in 1 week after discharge to make sure symptoms are controlled 4. May use norco as needed for pain control.  Discharge Diagnoses:  Principal Problem:   Chest pain Active Problems:   Malnutrition of moderate degree    Discharge Condition: medically stable for discharge home today  Diet recommendation: as tolerated  History of present illness:  62 y.o. female with past medical history of anxiety, GERD who presented to [facility] ED with complaints of chest pain for past few days prior to this admission. Patient reported the pain starts in the upper abdominal region and radiates to the left chest. Patient reported no relief with nitroglycerin, morphine or Advil. Pain is exacerbated with breathing. In ED, her vital signs are within normal limits. Oxygen saturation is 100% on room air, heart rate 71 and T 98.1 F. The 12-lead EKG showed no acute ischemic changes. Cardiac enzymes so far are all negative. 2-D echo shows ejection fraction 55-60%.   Assessment/Plan:   Principal Problem:  Chest pain  - Acute coronary syndrome ruled out as cardiac enzymes are negative and 2-D echo with essentially normal ejection fraction.  - The 12-lead EKG did not show acute ST-T wave changes  - Obtained abdominal x-ray  to evaluate possible GI etiology of pain but normal study.   Active Problems: Abdominal pain - no significant improvement with protonix so we changed to pepcid BID on discharge - pt refuses pain meds in hospital - abd x ray and CT abd  normal - SLP normal Code Status: Full code  Family Communication: No family at the bedside   Manson Passey, MD  [facility]  Pager [phone]   Consultants:  SLP - normal study Procedures:  None Antibiotics:  None   Signed:  Manson Passey, MD  [facility] 09/12/2013, 1:45 PM  Pager #: [phone]    Discharge Exam: Filed Vitals:   09/12/13 0500  BP: 142/71  Pulse: 79  Temp: 98.3 F (36.8 C)  Resp: 16   Filed Vitals:   09/11/13 0500 09/11/13 1324 09/11/13 2245 09/12/13 0500  BP: 131/74 123/71 121/67 142/71  Pulse: 80 77 90 79  Temp: 97.9 F (36.6 C) 98.6 F (37 C) 98.8 F (37.1 C) 98.3 F (36.8 C)  TempSrc: Oral Oral Oral Oral  Resp: 16 16 16 16   Height:      Weight:      SpO2: 99% 98% 99% 99%    General: Pt is alert, follows commands appropriately, not in acute distress Cardiovascular: Regular rate and rhythm, S1/S2 +, no murmurs, no rubs, no gallops Respiratory: Clear to auscultation bilaterally, no wheezing, no crackles, no rhonchi Abdominal: Soft, non tender, non distended, bowel sounds +, no guarding Extremities: no edema, no cyanosis, pulses palpable bilaterally DP and PT Neuro: Grossly nonfocal  Discharge Instructions  Discharge Orders   Future Orders Complete By Expires   Call MD for:  difficulty breathing, headache or visual disturbances  As directed    Call MD for:  persistant dizziness or light-headedness  As directed    Call MD for:  persistant nausea  and vomiting  As directed    Call MD for:  severe uncontrolled pain  As directed    Diet - low sodium heart healthy  As directed    Discharge instructions  As directed    Comments:     1. You were hospitalized for chest/abdominal pain. Your cardiac enzymes are negative. Your echocardiogram is normal. We have also obtain CT abdomen and x ray all of which were negative. 2. Slowly advance the diet as home as tolerated 3. Follow up with PCP in 1 week after discharge to make sure  symptoms are controlled 4. May use norco as needed for pain control.   Increase activity slowly  As directed        Medication List         aspirin EC 81 MG tablet  Take 81 mg by mouth daily.     famotidine 20 MG tablet  Commonly known as:  PEPCID  Take 1 tablet (20 mg total) by mouth 2 (two) times daily.     HYDROcodone-acetaminophen 5-325 MG per tablet  Commonly known as:  NORCO  Take 1 tablet by mouth every 6 (six) hours as needed for pain.     imipramine 25 MG tablet  Commonly known as:  TOFRANIL  Take 2 tablets (50 mg total) by mouth at bedtime.     LORazepam 1 MG tablet  Commonly known as:  ATIVAN  Take 2 tablets (2 mg total) by mouth at bedtime.     multivitamin with minerals Tabs tablet  Take 1 tablet by mouth daily.           Follow-up Information   Follow up with Crawford Givens, MD In 1 week.   Specialty:  Family Medicine   Contact information:   [address] [place] [address] [phone]        The results of significant diagnostics from this hospitalization (including imaging, microbiology, ancillary and laboratory) are listed below for reference.    Significant Diagnostic Studies: Dg Chest 2 View  09/10/2013   *RADIOLOGY REPORT*  Clinical Data: Chest pain for 2 weeks, initial encounter.  CHEST - 2 VIEW  Comparison: 03/24/2011  Findings: Grossly unchanged cardiac silhouette and mediastinal contours.  No focal airspace opacity.  No pleural effusion or pneumothorax.  No evidence of edema.  Grossly unchanged bones including moderate scoliotic curvature of the thoracolumbar spine.  IMPRESSION: 1.  No acute cardiopulmonary disease. 2.  Grossly unchanged moderate scoliotic curvature of the thoracolumbar spine.   Original Report Authenticated By: Tacey Ruiz, MD   Ct Abdomen W Contrast  09/12/2013   CLINICAL DATA:  Persistent epigastric pain, nausea, past history of hysterectomy  EXAM: CT ABDOMEN WITH CONTRAST  TECHNIQUE: Multidetector CT  imaging of the abdomen was performed using the standard protocol following bolus administration of intravenous contrast. Sagittal and coronal MPR images reconstructed from axial data set.  CONTRAST:  OMNIPAQUE IOHEXOL 300 MG/ML SOLN Dilute oral contrast.  COMPARISON:  04/03/2009  FINDINGS: Scattered respiratory motion artifacts.  Bibasilar atelectasis and small pleural effusions greater on left.  Small bilateral renal cysts.  Liver, spleen, pancreas, kidneys, and adrenal glands otherwise normal appearance.  Stomach decompressed.  Visualized bowel loops normal appearance.  No mass, adenopathy, free fluid or inflammatory process.  Mild degenerative changes and scoliosis lumbar spine.  IMPRESSION: Bibasilar atelectasis and small pleural effusions greater on left.  Small bilateral renal cysts.  No acute upper abdominal abnormalities.   Electronically Signed  By: Ulyses Southward M.D.   On: 09/12/2013 10:13   Dg Abd Portable 1v  09/11/2013   CLINICAL DATA:  Epigastric pain and nausea  EXAM: PORTABLE ABDOMEN - 1 VIEW  COMPARISON:  03/24/2011  FINDINGS: The bowel gas pattern is normal. No radio-opaque calculi or other significant radiographic abnormality are seen. Leftward curvature centered at L3 noted. Moderate stool burden noted throughout nondilated colon. Presence or absence of air-fluid levels or free air is suboptimally evaluated on this supine projection.  IMPRESSION: Negative.   Electronically Signed   By: Christiana Pellant M.D.   On: 09/11/2013 13:44    Microbiology: Recent Results (from the past 240 hour(s))  MRSA PCR SCREENING     Status: Abnormal   Collection Time    09/10/13  8:51 PM      Result Value Range Status   MRSA by PCR POSITIVE (*) NEGATIVE Final   Comment:            The GeneXpert MRSA Assay (FDA     approved for NASAL specimens     only), is one component of a     comprehensive MRSA colonization     surveillance program. It is not     intended to diagnose MRSA     infection nor  to guide or     monitor treatment for     MRSA infections.     RESULT CALLED TO, READ BACK BY AND VERIFIED WITH:     CALLED TO RN JENNIFER MALMSELT [phone] @2355  THANEY     Performed at [facility]     Labs: Basic Metabolic Panel:  Recent Labs Lab 09/10/13 1330 09/11/13 0345  NA 139 138  K 3.2* 3.7  CL 103 105  CO2 27 28  GLUCOSE 97 100*  BUN 15 13  CREATININE 0.92 0.90  CALCIUM 9.7 9.5   Liver Function Tests:  Recent Labs Lab 09/10/13 1330 09/11/13 0345  AST 22 18  ALT 11 9  ALKPHOS 70 66  BILITOT 0.4 0.3  PROT 6.8 6.0  ALBUMIN 3.7 3.3*   No results found for this basename: LIPASE, AMYLASE,  in the last 168 hours No results found for this basename: AMMONIA,  in the last 168 hours CBC:  Recent Labs Lab 09/10/13 1330 09/11/13 0345  WBC 3.6* 3.1*  NEUTROABS 2.2  --   HGB 12.4 11.1*  HCT 36.7 33.8*  MCV 90.6 91.4  PLT 156 148*   Cardiac Enzymes:  Recent Labs Lab 09/10/13 2155 09/11/13 0345 09/11/13 1005  TROPONINI <0.30 <0.30 <0.30   BNP: BNP (last 3 results) No results found for this basename: PROBNP,  in the last 8760 hours CBG: No results found for this basename: GLUCAP,  in the last 168 hours  Time coordinating discharge: Over 30 minutes

## 2013-09-12 NOTE — ED Provider Notes (Signed)
Medical screening examination/treatment/procedure(s) were performed by non-physician practitioner and as supervising physician I was immediately available for consultation/collaboration.   Shamel Galyean M Saintclair Schroader, DO 09/12/13 1205 

## 2013-09-12 NOTE — Progress Notes (Signed)
Discharge instructions given to patient, patient is upset because "they did not not look inside" she wants an upper endoscopy. Explained to patient that all the tests done were negative and to follow up with her primary Doctor if she continues to have more problems,Patient did not take her prescriptions and discharge instructions home with her,stated she doesn't need it also instructed patient to take her bactroban oint with her and to continue treatment at home for 3 more days, patient left the ointment also.

## 2013-09-12 NOTE — Progress Notes (Signed)
BSE completed, full report to follow.  No clinical indications of oropharyngeal dysphagia.  No focal CN deficits impacting V, VII, IX, X or XII obvious.  Pt's symptoms appear consistent with primary esophageal deficits.    Pt's complaints include sensation of food sticking in esophagus pointing to lower esophagus.  She reports she consumed cake and a sandwich last night for dinner - she was able to consume entire meal but felt that the food stuck.   SLP observed pt to consume water, single bite of applesauce and graham cracker.  No indications of oropharyngeal dysphagia or aspiration but pt did admit to feeling slow clearing of esophagus.    Pt further admits to issues with terrible heartburn that has progressed, she consumes milk or milkshake to manage symptoms.   ? If her significant heartburn symptoms are exacerbating a baseline dysphagia (pt reports issues for a "long time" but never having testing completed).    Provided compensation strategies to mitigate xerostomia and dysphagia symptoms.  Recommend consider dedicated testing of esophagus if MD feels warranted.  Thanks.  Donavan Burnet, MS [facility] SLP [phone]

## 2013-09-12 NOTE — Evaluation (Signed)
Clinical/Bedside Swallow Evaluation Patient Details  Name: Lindsay Reyes MRN: [id number] Date of Birth: 20-Mar-1951  Today's Date: 09/12/2013 Time: 1324-4010 SLP Time Calculation (min): 16 min  Past Medical History: History reviewed. No pertinent past medical history. Past Surgical History:  Past Surgical History  Procedure Laterality Date  . Abdominal hysterectomy     HPI:  62 yo female adm to [facility] with epigastric pain- reports discomfort when swallowing foods in particular.  Pt CXR indicative scoliotic curvature of thoracic columbar spine-NAD.  Abdomen xray negative.  Speech swallow evaluation ordered.    Assessment / Plan / Recommendation Clinical Impression  No clinical indications of oropharyngeal dysphagia.  No focal CN deficits impacting V, VII, IX, X or XII obvious.  Pt's symptoms appear consistent with primary esophageal deficits.  Pt's complaints include sensation of food sticking in esophagus pointing to lower esophagus.  She reports she consumed cake and a sandwich last night for dinner - she was able to consume entire meal but felt that the food stuck. SLP observed pt to consume water, single bite of applesauce and graham cracker.  No indications of oropharyngeal dysphagia or aspiration but pt did admit to feeling slow clearing of esophagus.  Pt further admits to issues with terrible heartburn that has progressed, she consumes milk or milkshake to manage symptoms.   ? If her significant heartburn symptoms are exacerbating a baseline dysphagia (pt reports issues for a "long time" but never having testing completed). Provided compensation strategies to mitigate xerostomia and dysphagia symptoms.  Recommend consider dedicated testing of esophagus if MD feels warranted.  Thanks.       Aspiration Risk  Mild    Diet Recommendation Regular;Thin liquid   Liquid Administration via: Cup Medication Administration: Whole meds with liquid Supervision: Patient able to self  feed Compensations: Slow rate;Small sips/bites;Follow solids with liquid Postural Changes and/or Swallow Maneuvers: Seated upright 90 degrees    Other  Recommendations Recommended Consults: Consider esophageal assessment Oral Care Recommendations: Oral care BID   Follow Up Recommendations  None    Frequency and Duration        Pertinent Vitals/Pain Afebrile, decreased    SLP Swallow Goals  n/a   Swallow Study Prior Functional Status    See notes of clinical impressions.    General Date of Onset: 09/12/13 HPI: 62 yo female adm to [facility] with epigastric pain- reports discomfort when swallowing foods in particular.  Pt CXR indicative scoliotic curvature of thoracic columbar spine-NAD.  Abdomen xray negative.  Speech swallow evaluation ordered.  Type of Study: Bedside swallow evaluation Diet Prior to this Study: Regular;Thin liquids Temperature Spikes Noted: No Respiratory Status: Room air History of Recent Intubation: No Behavior/Cognition: Alert;Cooperative Oral Cavity - Dentition: Adequate natural dentition Self-Feeding Abilities: Able to feed self Patient Positioning: Upright in bed Baseline Vocal Quality: Clear Volitional Cough: Strong Volitional Swallow: Able to elicit    Oral/Motor/Sensory Function Overall Oral Motor/Sensory Function: Appears within functional limits for tasks assessed   Ice Chips Ice chips: Not tested   Thin Liquid Thin Liquid: Within functional limits    Nectar Thick Nectar Thick Liquid: Not tested   Honey Thick Honey Thick Liquid: Not tested   Puree Puree: Within functional limits Presentation: Self Fed;Spoon   Solid   GO Functional Assessment Tool Used: clinical judgement Functional Limitations: Swallowing Swallow Current Status (U7253): At least 1 percent but less than 20 percent impaired, limited or restricted Swallow Goal Status [phone]): At least 1 percent but less than 20 percent impaired, limited  or restricted Swallow Discharge Status  [phone]): At least 1 percent but less than 20 percent impaired, limited or restricted  Solid: Within functional limits Presentation: Self Fed;Spoon       Mills Koller, MS [facility] SLP [phone]

## 2013-09-18 ENCOUNTER — Encounter: Payer: Self-pay | Admitting: Internal Medicine

## 2013-09-18 DIAGNOSIS — R109 Unspecified abdominal pain: Secondary | ICD-10-CM | POA: Insufficient documentation

## 2014-02-28 ENCOUNTER — Emergency Department (HOSPITAL_COMMUNITY): Payer: BC Managed Care – PPO

## 2014-02-28 ENCOUNTER — Encounter (HOSPITAL_COMMUNITY): Payer: Self-pay | Admitting: Emergency Medicine

## 2014-02-28 ENCOUNTER — Emergency Department (HOSPITAL_COMMUNITY)
Admission: EM | Admit: 2014-02-28 | Discharge: 2014-02-28 | Disposition: A | Discharge status: Home / Self Care | Payer: BC Managed Care – PPO | Attending: Emergency Medicine | Admitting: Emergency Medicine

## 2014-02-28 DIAGNOSIS — K5900 Constipation, unspecified: Secondary | ICD-10-CM | POA: Insufficient documentation

## 2014-02-28 DIAGNOSIS — R109 Unspecified abdominal pain: Secondary | ICD-10-CM

## 2014-02-28 DIAGNOSIS — Z7982 Long term (current) use of aspirin: Secondary | ICD-10-CM | POA: Insufficient documentation

## 2014-02-28 DIAGNOSIS — Z90710 Acquired absence of both cervix and uterus: Secondary | ICD-10-CM | POA: Insufficient documentation

## 2014-02-28 DIAGNOSIS — Z79899 Other long term (current) drug therapy: Secondary | ICD-10-CM | POA: Insufficient documentation

## 2014-02-28 LAB — COMPREHENSIVE METABOLIC PANEL
ALT: 12 U/L (ref 0–35)
AST: 23 U/L (ref 0–37)
Albumin: 3.8 g/dL (ref 3.5–5.2)
Alkaline Phosphatase: 89 U/L (ref 39–117)
BUN: 16 mg/dL (ref 6–23)
CALCIUM: 10.3 mg/dL (ref 8.4–10.5)
CO2: 30 meq/L (ref 19–32)
CREATININE: 0.88 mg/dL (ref 0.50–1.10)
Chloride: 104 mEq/L (ref 96–112)
GFR, EST AFRICAN AMERICAN: 81 mL/min — AB (ref >90)
GFR, EST NON AFRICAN AMERICAN: 69 mL/min — AB (ref >90)
GLUCOSE: 90 mg/dL (ref 70–99)
Potassium: 3.7 mEq/L (ref 3.7–5.3)
Sodium: 142 mEq/L (ref 137–147)
Total Bilirubin: 0.5 mg/dL (ref 0.3–1.2)
Total Protein: 7 g/dL (ref 6.0–8.3)

## 2014-02-28 LAB — URINALYSIS, ROUTINE W REFLEX MICROSCOPIC
BILIRUBIN URINE: NEGATIVE
Glucose, UA: NEGATIVE mg/dL
Ketones, ur: NEGATIVE mg/dL
Leukocytes, UA: NEGATIVE
NITRITE: NEGATIVE
PROTEIN: NEGATIVE mg/dL
Specific Gravity, Urine: 1.01 (ref 1.005–1.030)
UROBILINOGEN UA: 0.2 mg/dL (ref 0.0–1.0)
pH: 5 (ref 5.0–8.0)

## 2014-02-28 LAB — CBC WITH DIFFERENTIAL/PLATELET
Basophils Absolute: 0 10*3/uL (ref 0.0–0.1)
Basophils Relative: 1 % (ref 0–1)
EOS PCT: 5 % (ref 0–5)
Eosinophils Absolute: 0.2 10*3/uL (ref 0.0–0.7)
HCT: 38.4 % (ref 36.0–46.0)
HEMOGLOBIN: 12.7 g/dL (ref 12.0–15.0)
LYMPHS ABS: 0.9 10*3/uL (ref 0.7–4.0)
LYMPHS PCT: 29 % (ref 12–46)
MCH: 29.7 pg (ref 26.0–34.0)
MCHC: 33.1 g/dL (ref 30.0–36.0)
MCV: 89.7 fL (ref 78.0–100.0)
Monocytes Absolute: 0.4 10*3/uL (ref 0.1–1.0)
Monocytes Relative: 13 % — ABNORMAL HIGH (ref 3–12)
Neutro Abs: 1.7 10*3/uL (ref 1.7–7.7)
Neutrophils Relative %: 53 % (ref 43–77)
Platelets: 158 10*3/uL (ref 150–400)
RBC: 4.28 MIL/uL (ref 3.87–5.11)
RDW: 12 % (ref 11.5–15.5)
WBC: 3.3 10*3/uL — ABNORMAL LOW (ref 4.0–10.5)

## 2014-02-28 LAB — URINE MICROSCOPIC-ADD ON

## 2014-02-28 LAB — LIPASE, BLOOD: Lipase: 58 U/L (ref 11–59)

## 2014-02-28 NOTE — ED Notes (Addendum)
Pt c/o flank pain started last night and has not stopped feels better when she lays down. Has had hx of pain in that side and it usually goes away but not this time. Denies urinary difficulties. Not in any pain right now.

## 2014-02-28 NOTE — ED Provider Notes (Signed)
CSN: [id number]     Arrival date & time 02/28/14  1602 History   First MD Initiated Contact with Patient 02/28/14 1755     Chief Complaint  Patient presents with  . Flank Pain    left     (Consider location/radiation/quality/duration/timing/severity/associated sxs/prior Treatment) HPI Patient reports yesterday morning she started having left upper quadrant constant pain that radiated into her epigastric region. She states it hurts more when she palpates her abdomen. It feels better if she lays flat on her back. She denies any association with food. She has had nausea without vomiting or diarrhea. She denies cough, fever, dysuria, frequency. She denies any pain in her back or her flank. She denies any rectal bleeding. She states she's never had this pain before. She reports her mother has had gallstones and her sister had kidney stones and thyroid cancer. Patient states she last ate at 2 PM today.  PCP Dr Leonia Corona  History reviewed. No pertinent past medical history. Past Surgical History  Procedure Laterality Date  . Abdominal hysterectomy     No family history on file. History  Substance Use Topics  . Smoking status: Never Smoker   . Smokeless tobacco: Not on file  . Alcohol Use: Yes     Comment: socially   Employed Drinks wine only when sister from  Medical [facility] visits  OB History   Grav Para Term Preterm Abortions TAB SAB Ect Mult Living                 Review of Systems  All other systems reviewed and are negative.      Allergies  Review of patient's allergies indicates no known allergies.  Home Medications   Current Outpatient Rx  Name  Route  Sig  Dispense  Refill  . aspirin EC 81 MG tablet   Oral   Take 81 mg by mouth daily.         . Black Cohosh (BLACK COHOSH HOT FLASH RELIEF) 40 MG CAPS   Oral   Take 2 capsules by mouth daily.         . calcium carbonate (OS-CAL) 1250 MG chewable tablet   Oral   Chew 2 tablets by mouth daily.         . famotidine  (PEPCID) 20 MG tablet   Oral   Take 20 mg by mouth 2 (two) times daily as needed for heartburn.         Marland Kitchen imipramine (TOFRANIL) 25 MG tablet   Oral   Take 2 tablets (50 mg total) by mouth at bedtime.   30 tablet   3   . LORazepam (ATIVAN) 1 MG tablet   Oral   Take 2 tablets (2 mg total) by mouth at bedtime.   30 tablet   0   . Multiple Vitamin (MULTIVITAMIN WITH MINERALS) TABS tablet   Oral   Take 1 tablet by mouth daily.         . Vitamin D, Cholecalciferol, 400 UNITS CHEW   Oral   Chew 2 tablets by mouth daily.          BP 126/74  Pulse 78  Temp(Src) 97.8 F (36.6 C) (Oral)  Resp 18  SpO2 99%  Vital signs normal   Physical Exam  Nursing note and vitals reviewed. Constitutional: She is oriented to person, place, and time. She appears well-developed and well-nourished.  Non-toxic appearance. She does not appear ill. No distress.  HENT:  Head: Normocephalic and atraumatic.  Right Ear: External ear normal.  Left Ear: External ear normal.  Nose: Nose normal. No mucosal edema or rhinorrhea.  Mouth/Throat: Oropharynx is clear and moist and mucous membranes are normal. No dental abscesses or uvula swelling.  Eyes: Conjunctivae and EOM are normal. Pupils are equal, round, and reactive to light.  Neck: Normal range of motion and full passive range of motion without pain. Neck supple.  Cardiovascular: Normal rate, regular rhythm and normal heart sounds.  Exam reveals no gallop and no friction rub.   No murmur heard. Pulmonary/Chest: Effort normal and breath sounds normal. No respiratory distress. She has no wheezes. She has no rhonchi. She has no rales. She exhibits no tenderness and no crepitus.  Abdominal: Soft. Normal appearance and bowel sounds are normal. She exhibits no distension. There is tenderness. There is no rebound and no guarding.    Pt very tender in the epigastric area, but points to the LUQ as to where her pain is located.   Musculoskeletal: Normal  range of motion. She exhibits no edema and no tenderness.  Moves all extremities well.   Neurological: She is alert and oriented to person, place, and time. She has normal strength. No cranial nerve deficit.  Skin: Skin is warm, dry and intact. No rash noted. No erythema. No pallor.  Psychiatric: She has a normal mood and affect. Her speech is normal and behavior is normal. Her mood appears not anxious.    ED Course  Procedures (including critical care time)   Patient refused pain or nausea medicine.  20:55 Pt still refusing pain medications, Given test results, is post-menopausal with hematuria, is agreeable to getting CT scan.   Discussed pt's CT results and we reviewed her CT scan. She has a lot of stool, and has stool in her colon in the LUQ corresponding to her pain location. States she has balls, incomplete BM's and hard stools, does not want to take miralax, states dulcolax has helped in the past.  States she is taking fiber.   Labs Review Results for orders placed during the hospital encounter of 02/28/14  CBC WITH DIFFERENTIAL      Result Value Ref Range   WBC 3.3 (*) 4.0 - 10.5 K/uL   RBC 4.28  3.87 - 5.11 MIL/uL   Hemoglobin 12.7  12.0 - 15.0 g/dL   HCT 38.4  36.0 - 46.0 %   MCV 89.7  78.0 - 100.0 fL   MCH 29.7  26.0 - 34.0 pg   MCHC 33.1  30.0 - 36.0 g/dL   RDW 12.0  11.5 - 15.5 %   Platelets 158  150 - 400 K/uL   Neutrophils Relative % 53  43 - 77 %   Neutro Abs 1.7  1.7 - 7.7 K/uL   Lymphocytes Relative 29  12 - 46 %   Lymphs Abs 0.9  0.7 - 4.0 K/uL   Monocytes Relative 13 (*) 3 - 12 %   Monocytes Absolute 0.4  0.1 - 1.0 K/uL   Eosinophils Relative 5  0 - 5 %   Eosinophils Absolute 0.2  0.0 - 0.7 K/uL   Basophils Relative 1  0 - 1 %   Basophils Absolute 0.0  0.0 - 0.1 K/uL  COMPREHENSIVE METABOLIC PANEL      Result Value Ref Range   Sodium 142  137 - 147 mEq/L   Potassium 3.7  3.7 - 5.3 mEq/L   Chloride 104  96 - 112 mEq/L   CO2 30  19 -  32 mEq/L   Glucose,  Bld 90  70 - 99 mg/dL   BUN 16  6 - 23 mg/dL   Creatinine, Ser 0.88  0.50 - 1.10 mg/dL   Calcium 10.3  8.4 - 10.5 mg/dL   Total Protein 7.0  6.0 - 8.3 g/dL   Albumin 3.8  3.5 - 5.2 g/dL   AST 23  0 - 37 U/L   ALT 12  0 - 35 U/L   Alkaline Phosphatase 89  39 - 117 U/L   Total Bilirubin 0.5  0.3 - 1.2 mg/dL   GFR calc non Af Amer 69 (*) >90 mL/min   GFR calc Af Amer 81 (*) >90 mL/min  URINALYSIS, ROUTINE W REFLEX MICROSCOPIC      Result Value Ref Range   Color, Urine YELLOW  YELLOW   APPearance CLEAR  CLEAR   Specific Gravity, Urine 1.010  1.005 - 1.030   pH 5.0  5.0 - 8.0   Glucose, UA NEGATIVE  NEGATIVE mg/dL   Hgb urine dipstick SMALL (*) NEGATIVE   Bilirubin Urine NEGATIVE  NEGATIVE   Ketones, ur NEGATIVE  NEGATIVE mg/dL   Protein, ur NEGATIVE  NEGATIVE mg/dL   Urobilinogen, UA 0.2  0.0 - 1.0 mg/dL   Nitrite NEGATIVE  NEGATIVE   Leukocytes, UA NEGATIVE  NEGATIVE  URINE MICROSCOPIC-ADD ON      Result Value Ref Range   Squamous Epithelial / LPF RARE  RARE   RBC / HPF 3-6  <3 RBC/hpf  LIPASE, BLOOD      Result Value Ref Range   Lipase 58  11 - 59 U/L   Laboratory interpretation all normal except microscopic hematuria    Imaging Review US Abdomen Limited  02/28/2014   CLINICAL DATA:  Epigastric pain  EXAM: US ABDOMEN LIMITED - RIGHT UPPER QUADRANT  COMPARISON:  None.  FINDINGS: Gallbladder:  Contracted although no definitive stones are seen. No pericholecystic fluid is noted.  Common bile duct:  Diameter: 5 mm.  Liver:  No focal lesion identified. Within normal limits in parenchymal echogenicity.  IMPRESSION: No acute abnormality noted.   Electronically Signed   By: Inez Catalina M.D.   On: 02/28/2014 19:50     EKG Interpretation None      MDM   Final diagnoses:  Constipation  Abdominal pain    Plan discharge  Rolland Porter, MD, Alanson Aly, MD 02/28/14 2311

## 2014-02-28 NOTE — ED Notes (Signed)
Pt back from ultrasound.

## 2014-02-28 NOTE — Discharge Instructions (Signed)
Take the dulcolax daily for 3-4 days, You can also try magnesium citrate. Drink plenty of fluids. Consider miralax daily. Let Dr Alroy Dust know if you aren't improving.    Constipation, Adult Constipation is when a person has fewer than 3 bowel movements a week; has difficulty having a bowel movement; or has stools that are dry, hard, or larger than normal. As people grow older, constipation is more common. If you try to fix constipation with medicines that make you have a bowel movement (laxatives), the problem may get worse. Long-term laxative use may cause the muscles of the colon to become weak. A low-fiber diet, not taking in enough fluids, and taking certain medicines may make constipation worse. CAUSES   Certain medicines, such as antidepressants, pain medicine, iron supplements, antacids, and water pills.   Certain diseases, such as diabetes, irritable bowel syndrome (IBS), thyroid disease, or depression.   Not drinking enough water.   Not eating enough fiber-rich foods.   Stress or travel.  Lack of physical activity or exercise.  Not going to the restroom when there is the urge to have a bowel movement.  Ignoring the urge to have a bowel movement.  Using laxatives too much. SYMPTOMS   Having fewer than 3 bowel movements a week.   Straining to have a bowel movement.   Having hard, dry, or larger than normal stools.   Feeling full or bloated.   Pain in the lower abdomen.  Not feeling relief after having a bowel movement. DIAGNOSIS  Your caregiver will take a medical history and perform a physical exam. Further testing may be done for severe constipation. Some tests may include:   A barium enema X-ray to examine your rectum, colon, and sometimes, your small intestine.  A sigmoidoscopy to examine your lower colon.  A colonoscopy to examine your entire colon. TREATMENT  Treatment will depend on the severity of your constipation and what is causing it. Some  dietary treatments include drinking more fluids and eating more fiber-rich foods. Lifestyle treatments may include regular exercise. If these diet and lifestyle recommendations do not help, your caregiver may recommend taking over-the-counter laxative medicines to help you have bowel movements. Prescription medicines may be prescribed if over-the-counter medicines do not work.  HOME CARE INSTRUCTIONS   Increase dietary fiber in your diet, such as fruits, vegetables, whole grains, and beans. Limit high-fat and processed sugars in your diet, such as [place] fries, hamburgers, cookies, candies, and soda.   A fiber supplement may be added to your diet if you cannot get enough fiber from foods.   Drink enough fluids to keep your urine clear or pale yellow.   Exercise regularly or as directed by your caregiver.   Go to the restroom when you have the urge to go. Do not hold it.  Only take medicines as directed by your caregiver. Do not take other medicines for constipation without talking to your caregiver first. [place] IF:   You have bright red blood in your stool.   Your constipation lasts for more than 4 days or gets worse.   You have abdominal or rectal pain.   You have thin, pencil-like stools.  You have unexplained weight loss. MAKE SURE YOU:   Understand these instructions.  Will watch your condition.  Will get help right away if you are not doing well or get worse. Document Released: 08/21/2004 Document Revised: 02/15/2012 Document Reviewed: 09/04/2013 [facility] Patient Information 2014 [place], [place].

## 2014-03-01 ENCOUNTER — Other Ambulatory Visit (HOSPITAL_COMMUNITY): Payer: BC Managed Care – PPO

## 2014-04-20 ENCOUNTER — Emergency Department (HOSPITAL_BASED_OUTPATIENT_CLINIC_OR_DEPARTMENT_OTHER): Payer: Worker's Compensation

## 2014-04-20 ENCOUNTER — Emergency Department (HOSPITAL_BASED_OUTPATIENT_CLINIC_OR_DEPARTMENT_OTHER)
Admission: EM | Admit: 2014-04-20 | Discharge: 2014-04-20 | Disposition: A | Discharge status: Home / Self Care | Payer: Worker's Compensation | Attending: Emergency Medicine | Admitting: Emergency Medicine

## 2014-04-20 ENCOUNTER — Encounter (HOSPITAL_BASED_OUTPATIENT_CLINIC_OR_DEPARTMENT_OTHER): Payer: Self-pay | Admitting: Emergency Medicine

## 2014-04-20 DIAGNOSIS — Z79899 Other long term (current) drug therapy: Secondary | ICD-10-CM | POA: Insufficient documentation

## 2014-04-20 DIAGNOSIS — S99929A Unspecified injury of unspecified foot, initial encounter: Principal | ICD-10-CM

## 2014-04-20 DIAGNOSIS — Y990 Civilian activity done for income or pay: Secondary | ICD-10-CM | POA: Insufficient documentation

## 2014-04-20 DIAGNOSIS — Y9389 Activity, other specified: Secondary | ICD-10-CM | POA: Insufficient documentation

## 2014-04-20 DIAGNOSIS — T148XXA Other injury of unspecified body region, initial encounter: Secondary | ICD-10-CM

## 2014-04-20 DIAGNOSIS — S99919A Unspecified injury of unspecified ankle, initial encounter: Principal | ICD-10-CM

## 2014-04-20 DIAGNOSIS — Z7982 Long term (current) use of aspirin: Secondary | ICD-10-CM | POA: Insufficient documentation

## 2014-04-20 DIAGNOSIS — IMO0002 Reserved for concepts with insufficient information to code with codable children: Secondary | ICD-10-CM | POA: Insufficient documentation

## 2014-04-20 DIAGNOSIS — Y9289 Other specified places as the place of occurrence of the external cause: Secondary | ICD-10-CM | POA: Insufficient documentation

## 2014-04-20 DIAGNOSIS — W010XXA Fall on same level from slipping, tripping and stumbling without subsequent striking against object, initial encounter: Secondary | ICD-10-CM | POA: Insufficient documentation

## 2014-04-20 DIAGNOSIS — S8990XA Unspecified injury of unspecified lower leg, initial encounter: Secondary | ICD-10-CM | POA: Insufficient documentation

## 2014-04-20 MED ORDER — IBUPROFEN 400 MG PO TABS
600.0000 mg | ORAL_TABLET | Freq: Once | ORAL | Status: AC
  Administered 2014-04-20: 600 mg via ORAL
  Filled 2014-04-20 (×2): qty 1

## 2014-04-20 NOTE — ED Notes (Signed)
Pt reports pain to left knee when she fell at work onto concrete.  Pt sts also having pain to right ribs.

## 2014-04-20 NOTE — ED Notes (Signed)
Pt c/o left knee injury landing on concrete floor x 1 hr ago

## 2014-04-20 NOTE — Discharge Instructions (Signed)
You likely bruised your left knee. The xray showed no fracture.  You also likely have some bruising on your right chest.  You can rest, ice, elevate, and compress (with an ace bandage or over the counter knee sleeve) the knee for symptomatic relief.  Take ibuprofen 4-600mg  with food 3-4 times daily as needed x 1 week. Seek immediate care if the knee or breast change, get red, swollen, or much more tender.

## 2014-04-20 NOTE — ED Provider Notes (Signed)
CSN: [id number]     Arrival date & time 04/20/14  1914 History   First MD Initiated Contact with Patient 04/20/14 2002     Chief Complaint  Patient presents with  . Knee Injury    HPI  Patient is here at boss's request due to knee pain after fall and right breast pain.  About 1 hour prior to arrival patient had a fall where she injured left knee. She was walking quickly at work and tripped over the base of a pole and landed on her left knee. She is unsure what else happened and initially thought she broke it because pain was severe. She iced it and now it does not hurt at all. She is able to walk on it. She denies prior injury to that knee.   She now is complaining more of right breast pain that appeared while she was in the ED. Pain is "stabbing" and hurts whenever she moves or touches it, encompassing entire breast. She denies skin changes, abnormal discharge, or prior issue with breast. She does not even think she hit the breast but is unsure.  History reviewed. No pertinent past medical history. Past Surgical History  Procedure Laterality Date  . Abdominal hysterectomy     History reviewed. No pertinent family history. History  Substance Use Topics  . Smoking status: Never Smoker   . Smokeless tobacco: Not on file  . Alcohol Use: Yes     Comment: socially   OB History   Grav Para Term Preterm Abortions TAB SAB Ect Mult Living                 Review of Systems  All other systems reviewed and are negative.   Allergies  Review of patient's allergies indicates no known allergies.  Home Medications   Prior to Admission medications   Medication Sig Start Date End Date Taking? Authorizing Provider  aspirin EC 81 MG tablet Take 81 mg by mouth daily.    Historical Provider, MD  Black Cohosh (BLACK COHOSH HOT FLASH RELIEF) 40 MG CAPS Take 2 capsules by mouth daily.    Historical Provider, MD  calcium carbonate (OS-CAL) 1250 MG chewable tablet Chew 2 tablets by mouth daily.     Historical Provider, MD  famotidine (PEPCID) 20 MG tablet Take 20 mg by mouth 2 (two) times daily as needed for heartburn. 09/12/13   Robbie Lis, MD  imipramine (TOFRANIL) 25 MG tablet Take 2 tablets (50 mg total) by mouth at bedtime. 09/12/13   Robbie Lis, MD  LORazepam (ATIVAN) 1 MG tablet Take 2 tablets (2 mg total) by mouth at bedtime. 09/12/13   Robbie Lis, MD  Multiple Vitamin (MULTIVITAMIN WITH MINERALS) TABS tablet Take 1 tablet by mouth daily.    Historical Provider, MD  Vitamin D, Cholecalciferol, 400 UNITS CHEW Chew 2 tablets by mouth daily.    Historical Provider, MD   BP 134/84  Pulse 90  Temp(Src) 98 F (36.7 C) (Oral)  Resp 16  Ht 5\' 5"  (1.651 m)  Wt 135 lb (61.236 kg)  BMI 22.47 kg/m2  SpO2 98% Physical Exam GEN: NAD HEENT: Atraumatic, normocephalic, neck supple, EOMI, sclera clear, o/p clear, PERRL  CV: RRR, no murmurs, rubs, or gallops PULM: CTAB, normal effort ABD: Soft, nontender, nondistended, NABS, no organomegaly SKIN: No rash or cyanosis; warm and well-perfused EXTR: No lower extremity edema or calf tenderness; left knee with faint bruising anteriorly. No tenderness, erythema, deformity, or effusion. Negative anterior or  posterior drawer sign. 5/5 hip flexion, extension, quadracep flexion and extension. CHEST: Right breast exquisitely tender on exam, however nontender when pushing with similar force with stethoscope; cries out in pain when sits up after discussing breast pain, with no reproduced pain during finger-nose-finger when pt is distracted. No erythema, deformity, skin change, or swelling. PSYCH: Mood and affect euthymic, normal rate and volume of speech NEURO: Awake, alert, no focal deficits grossly, normal speech  ED Course  Procedures (including critical care time) Labs Review Labs Reviewed - No data to display  Imaging Review Dg Knee Complete 4 Views Left  04/20/2014   CLINICAL DATA:  Status post fall with knee pain  EXAM: LEFT KNEE -  COMPLETE 4+ VIEW  COMPARISON:  None.  FINDINGS: There is no evidence of fracture, dislocation, or joint effusion. There mild degenerative joint changes with tibial spine osteophytosis and narrowed joint space. Soft tissues are unremarkable.  IMPRESSION: No acute fracture or dislocation.   Electronically Signed   By: Abelardo Diesel M.D.   On: 04/20/2014 19:54     EKG Interpretation None      MDM   Final diagnoses:  None   63 y.o. female with no significant PMH here after falling at work, with left knee and right breast pain.  Left knee pain Most likely superficial bruising with no instability, effusion, or deformity on exam and pain mostly resolved. Xray with no acute abnormality. - Rest, ice, compress, and elevate.  - Ibuprofen 4-600mg  q3-4 hours PRN x 1 week. - F/u with PCP if no improvement.  - Return precautions discussed.  Right breast pain Developed while in ED, no obvious abnormalities on exam and no previous hx to suggest chronic issue. Exam is inconsistently tender.  - Continue to stay active.  - Ibuprofen 600mg  x 1 now, then PRN per above. - Return precautions reviewed.  Hilton Sinclair, MD PGY-2, [place], MD 04/20/14 2126

## 2014-04-22 NOTE — ED Provider Notes (Signed)
I saw and evaluated the patient, reviewed the resident's note and I agree with the findings and plan.   EKG Interpretation None        Houston Siren III, MD 04/22/14 [phone]

## 2014-05-22 ENCOUNTER — Emergency Department (HOSPITAL_BASED_OUTPATIENT_CLINIC_OR_DEPARTMENT_OTHER): Payer: Worker's Compensation

## 2014-05-22 ENCOUNTER — Encounter (HOSPITAL_BASED_OUTPATIENT_CLINIC_OR_DEPARTMENT_OTHER): Payer: Self-pay | Admitting: Emergency Medicine

## 2014-05-22 ENCOUNTER — Emergency Department (HOSPITAL_BASED_OUTPATIENT_CLINIC_OR_DEPARTMENT_OTHER)
Admission: EM | Admit: 2014-05-22 | Discharge: 2014-05-23 | Disposition: A | Discharge status: Home / Self Care | Payer: Worker's Compensation | Attending: Emergency Medicine | Admitting: Emergency Medicine

## 2014-05-22 DIAGNOSIS — Z23 Encounter for immunization: Secondary | ICD-10-CM | POA: Insufficient documentation

## 2014-05-22 DIAGNOSIS — S60229A Contusion of unspecified hand, initial encounter: Secondary | ICD-10-CM | POA: Insufficient documentation

## 2014-05-22 DIAGNOSIS — S61409A Unspecified open wound of unspecified hand, initial encounter: Secondary | ICD-10-CM | POA: Insufficient documentation

## 2014-05-22 DIAGNOSIS — W319XXA Contact with unspecified machinery, initial encounter: Secondary | ICD-10-CM | POA: Diagnosis not present

## 2014-05-22 DIAGNOSIS — Y9289 Other specified places as the place of occurrence of the external cause: Secondary | ICD-10-CM | POA: Insufficient documentation

## 2014-05-22 DIAGNOSIS — Y990 Civilian activity done for income or pay: Secondary | ICD-10-CM | POA: Insufficient documentation

## 2014-05-22 DIAGNOSIS — S60221A Contusion of right hand, initial encounter: Secondary | ICD-10-CM

## 2014-05-22 DIAGNOSIS — Z7982 Long term (current) use of aspirin: Secondary | ICD-10-CM | POA: Insufficient documentation

## 2014-05-22 DIAGNOSIS — Z79899 Other long term (current) drug therapy: Secondary | ICD-10-CM | POA: Insufficient documentation

## 2014-05-22 DIAGNOSIS — S61411A Laceration without foreign body of right hand, initial encounter: Secondary | ICD-10-CM

## 2014-05-22 MED ORDER — CEFAZOLIN SODIUM 1-5 GM-% IV SOLN
1.0000 g | Freq: Once | INTRAVENOUS | Status: AC
  Administered 2014-05-23: 1 g via INTRAVENOUS
  Filled 2014-05-22: qty 50

## 2014-05-22 MED ORDER — TETANUS-DIPHTH-ACELL PERTUSSIS 5-2.5-18.5 LF-MCG/0.5 IM SUSP
0.5000 mL | Freq: Once | INTRAMUSCULAR | Status: AC
  Administered 2014-05-23: 0.5 mL via INTRAMUSCULAR
  Filled 2014-05-22: qty 0.5

## 2014-05-22 NOTE — ED Provider Notes (Signed)
CSN: [id number]     Arrival date & time 05/22/14  2332 History  This chart was scribed for Wynetta Fines, MD by Elby Beck, ED Scribe. This patient was seen in room MH06/MH06 and the patient's care was started at 11:45 PM.   Chief Complaint  Patient presents with  . Extremity Laceration    The history is provided by the patient. No language interpreter was used.    HPI Comments: Lindsay Reyes is a 63 y.o. female who presents to the Emergency Department complaining of a laceration to the dorsum of her right hand that occurred about 20 minutes ago. She states that she sustained the laceration when she accidentally got the hand stuck in a machine. Bleeding to the laceration is controlled. She reports having constant, 2/10 pain to the area of the laceration. She also reports having associated decreased sensation in the third, fourth and fifth fingers of her right hand. She also describes feeling like her right hand is "stiff". She states that she did not sustain any other injuries tonight.    History reviewed. No pertinent past medical history. Past Surgical History  Procedure Laterality Date  . Abdominal hysterectomy     History reviewed. No pertinent family history. History  Substance Use Topics  . Smoking status: Never Smoker   . Smokeless tobacco: Not on file  . Alcohol Use: Yes     Comment: socially   OB History   Grav Para Term Preterm Abortions TAB SAB Ect Mult Living                 Review of Systems A complete 10 system review of systems was obtained and all systems are negative except as noted in the HPI and PMH.   Allergies  Review of patient's allergies indicates no known allergies.  Home Medications   Prior to Admission medications   Medication Sig Start Date End Date Taking? Authorizing Provider  imipramine (TOFRANIL) 25 MG tablet Take 2 tablets (50 mg total) by mouth at bedtime. 09/12/13  Yes Robbie Lis, MD  LORazepam (ATIVAN) 1 MG tablet Take 2 tablets (2  mg total) by mouth at bedtime. 09/12/13  Yes Robbie Lis, MD  aspirin EC 81 MG tablet Take 81 mg by mouth daily.    Historical Provider, MD  Black Cohosh (BLACK COHOSH HOT FLASH RELIEF) 40 MG CAPS Take 2 capsules by mouth daily.    Historical Provider, MD  calcium carbonate (OS-CAL) 1250 MG chewable tablet Chew 2 tablets by mouth daily.    Historical Provider, MD  famotidine (PEPCID) 20 MG tablet Take 20 mg by mouth 2 (two) times daily as needed for heartburn. 09/12/13   Robbie Lis, MD  Multiple Vitamin (MULTIVITAMIN WITH MINERALS) TABS tablet Take 1 tablet by mouth daily.    Historical Provider, MD  Vitamin D, Cholecalciferol, 400 UNITS CHEW Chew 2 tablets by mouth daily.    Historical Provider, MD   Triage Vitals: BP 159/94  Pulse 89  Temp(Src) 98 F (36.7 C) (Oral)  Resp 20  Ht 5\' 4"  (1.626 m)  Wt 130 lb (58.968 kg)  BMI 22.30 kg/m2  SpO2 98%  Physical Exam  Nursing note and vitals reviewed. General: Well-developed, well-nourished female in no acute distress; appearance consistent with age of record HENT: normocephalic; atraumatic Eyes: pupils equal, round and reactive to light; extraocular muscles intact Neck: supple Heart: regular rate and rhythm; no murmurs, rubs or gallops Lungs: clear to auscultation bilaterally Abdomen: soft; nondistended; nontender;  no masses or hepatosplenomegaly; bowel sounds present Extremities: No deformity; full range of motion; pulses normal Neurologic: Awake, alert and oriented; motor function intact in all extremities and symmetric; no facial droop Skin: Warm and dry; Laceration to the dorsal right hand with intact tendons visible in the wound. Flexion and extension are intact in the had. Distal capillary refill is intact. Sensation is intact but subjectively decreased in the right 3rd, 4th and 5th fingers.     Psychiatric: Normal mood and affect  ED Course  Procedures (including critical care time)  DIAGNOSTIC STUDIES: Oxygen Saturation is  98% on RA, normal by my interpretation.    COORDINATION OF CARE: 11:49 PM- Discussed plan for laceration repair. Will also start pt on IV antibiotics. An X-ray of pt's right hand has also been ordered. Pt advised of plan for treatment and pt agrees.  LACERATION REPAIR Performed by: Wynetta Fines Authorized by: Wynetta Fines Consent: Verbal consent obtained. Risks and benefits: risks, benefits and alternatives were discussed Consent given by: patient Patient identity confirmed: provided demographic data Prepped and Draped in normal sterile fashion Wound explored, tendons seen but no tendon lacerations noted on range of motion  Laceration Location: Dorsal right hand  Laceration Length: 4.5 cm  No Foreign Bodies seen or palpated  Anesthesia: local infiltration  Local anesthetic: lidocaine 2 % with epinephrine  Anesthetic total: 4 ml  Irrigation method: syringe Amount of cleaning: standard  Skin closure: 4-0 Prolene and nylon   Number of sutures: 8   Technique: Simple interrupted   Patient tolerance: Patient tolerated the procedure well with no immediate complications. There has been interval development of a hematoma associated with the wound:       MDM  Nursing notes and vitals signs, including pulse oximetry, reviewed.  Summary of this visit's results, reviewed by myself:  Labs:  No results found for this or any previous visit (from the past 24 hour(s)).  Imaging Studies: Dg Hand Complete Right  2014/06/13   CLINICAL DATA:  Work related injury, machine versus hand.  EXAM: RIGHT HAND - COMPLETE 3+ VIEW  COMPARISON:  None.  FINDINGS: There is no evidence of fracture or dislocation. There is no evidence of arthropathy or other focal bone abnormality. Dorsal hand soft tissue swelling without subcutaneous gas or radiopaque foreign bodies.  IMPRESSION: No acute fracture deformity nor dislocation. Dorsal hand soft tissue swelling.   Electronically Signed   By: Elon Alas   On: 06-13-14 00:14    I personally performed the services described in this documentation, which was scribed in my presence. The recorded information has been reviewed and is accurate.   Wynetta Fines, MD 13-Jun-2014 [phone]

## 2014-05-22 NOTE — ED Notes (Signed)
Pt got hand stuck in machine, top of hand lacerated

## 2014-05-23 DIAGNOSIS — S61409A Unspecified open wound of unspecified hand, initial encounter: Secondary | ICD-10-CM | POA: Diagnosis not present

## 2014-05-23 MED ORDER — CEPHALEXIN 500 MG PO CAPS: 500.0000 mg | ORAL_CAPSULE | Freq: Four times a day (QID) | ORAL | Status: DC

## 2015-01-23 ENCOUNTER — Other Ambulatory Visit (HOSPITAL_COMMUNITY): Payer: Self-pay | Admitting: *Deleted

## 2015-01-23 DIAGNOSIS — H3561 Retinal hemorrhage, right eye: Secondary | ICD-10-CM

## 2015-01-25 ENCOUNTER — Ambulatory Visit (HOSPITAL_COMMUNITY): Payer: BLUE CROSS/BLUE SHIELD | Attending: Cardiovascular Disease | Admitting: Cardiology

## 2015-01-25 ENCOUNTER — Other Ambulatory Visit (HOSPITAL_COMMUNITY): Payer: Self-pay | Admitting: Family Medicine

## 2015-01-25 ENCOUNTER — Ambulatory Visit (HOSPITAL_BASED_OUTPATIENT_CLINIC_OR_DEPARTMENT_OTHER): Payer: BLUE CROSS/BLUE SHIELD | Admitting: *Deleted

## 2015-01-25 DIAGNOSIS — H5361 Abnormal dark adaptation curve: Secondary | ICD-10-CM | POA: Insufficient documentation

## 2015-01-25 DIAGNOSIS — H3561 Retinal hemorrhage, right eye: Secondary | ICD-10-CM

## 2015-01-25 NOTE — Progress Notes (Signed)
Echo performed. 

## 2015-01-25 NOTE — Progress Notes (Signed)
Carotid Duplex Exam Performed 

## 2015-01-31 ENCOUNTER — Other Ambulatory Visit: Payer: Self-pay | Admitting: Family Medicine

## 2015-01-31 DIAGNOSIS — R931 Abnormal findings on diagnostic imaging of heart and coronary circulation: Secondary | ICD-10-CM

## 2015-02-06 ENCOUNTER — Ambulatory Visit
Admission: RE | Admit: 2015-02-06 | Discharge: 2015-02-06 | Disposition: A | Discharge status: Home / Self Care | Payer: BLUE CROSS/BLUE SHIELD | Source: Ambulatory Visit | Attending: Family Medicine | Admitting: Family Medicine

## 2015-02-06 DIAGNOSIS — R931 Abnormal findings on diagnostic imaging of heart and coronary circulation: Secondary | ICD-10-CM

## 2015-02-06 MED ORDER — IOHEXOL 350 MG/ML SOLN: 75.0000 mL | Freq: Once | INTRAVENOUS | Status: DC | PRN

## 2015-02-06 MED ORDER — IOPAMIDOL (ISOVUE-370) INJECTION 76%
75.0000 mL | Freq: Once | INTRAVENOUS | Status: AC | PRN
  Administered 2015-02-06: 75 mL via INTRAVENOUS

## 2015-04-25 ENCOUNTER — Other Ambulatory Visit: Payer: Self-pay | Admitting: Family Medicine

## 2015-04-25 DIAGNOSIS — R918 Other nonspecific abnormal finding of lung field: Secondary | ICD-10-CM

## 2015-04-29 ENCOUNTER — Encounter: Payer: Self-pay | Admitting: Family Medicine

## 2015-05-10 ENCOUNTER — Ambulatory Visit
Admission: RE | Admit: 2015-05-10 | Discharge: 2015-05-10 | Disposition: A | Discharge status: Home / Self Care | Payer: BLUE CROSS/BLUE SHIELD | Source: Ambulatory Visit | Attending: Family Medicine | Admitting: Family Medicine

## 2015-05-10 DIAGNOSIS — R918 Other nonspecific abnormal finding of lung field: Secondary | ICD-10-CM

## 2015-05-10 MED ORDER — IOHEXOL 300 MG/ML  SOLN
75.0000 mL | Freq: Once | INTRAMUSCULAR | Status: AC | PRN
  Administered 2015-05-10: 75 mL via INTRAVENOUS

## 2017-04-05 LAB — LIPID PANEL
Cholesterol: 213 — AB (ref 0–200)
HDL: 49 (ref 35–70)
LDL CALC: 130
Triglycerides: 170 — AB (ref 40–160)

## 2017-04-16 ENCOUNTER — Encounter (HOSPITAL_BASED_OUTPATIENT_CLINIC_OR_DEPARTMENT_OTHER): Payer: Self-pay

## 2017-04-16 ENCOUNTER — Emergency Department (HOSPITAL_BASED_OUTPATIENT_CLINIC_OR_DEPARTMENT_OTHER)
Admission: EM | Admit: 2017-04-16 | Discharge: 2017-04-16 | Disposition: A | Discharge status: Home / Self Care | Payer: Worker's Compensation | Attending: Emergency Medicine | Admitting: Emergency Medicine

## 2017-04-16 DIAGNOSIS — Y990 Civilian activity done for income or pay: Secondary | ICD-10-CM | POA: Insufficient documentation

## 2017-04-16 DIAGNOSIS — Y939 Activity, unspecified: Secondary | ICD-10-CM | POA: Diagnosis not present

## 2017-04-16 DIAGNOSIS — X501XXA Overexertion from prolonged static or awkward postures, initial encounter: Secondary | ICD-10-CM | POA: Diagnosis not present

## 2017-04-16 DIAGNOSIS — S29012A Strain of muscle and tendon of back wall of thorax, initial encounter: Secondary | ICD-10-CM

## 2017-04-16 DIAGNOSIS — S299XXA Unspecified injury of thorax, initial encounter: Secondary | ICD-10-CM | POA: Diagnosis present

## 2017-04-16 DIAGNOSIS — Y929 Unspecified place or not applicable: Secondary | ICD-10-CM | POA: Diagnosis not present

## 2017-04-16 MED ORDER — NAPROXEN 375 MG PO TABS: 375.0000 mg | ORAL_TABLET | Freq: Two times a day (BID) | ORAL | 0 refills | Status: DC

## 2017-04-16 MED ORDER — CYCLOBENZAPRINE HCL 10 MG PO TABS: 10.0000 mg | ORAL_TABLET | Freq: Every day | ORAL | 0 refills | Status: AC

## 2017-04-16 NOTE — ED Notes (Signed)
ED Provider at bedside. 

## 2017-04-16 NOTE — ED Notes (Signed)
Pt skeptical about having an EKG done due to "I have nothing wrong with my heart." EMT explained the reason behind obtaining an EKG and pt agreed to have it done.

## 2017-04-16 NOTE — ED Provider Notes (Signed)
[facility] Provider Note   CSN: [id number] Arrival date & time: 04/16/17  2055     History   Chief Complaint Chief Complaint  Patient presents with  . Back Pain    HPI Lindsay Reyes is a 66 y.o. female.  The history is provided by the patient.  Back Pain   This is a new problem. Episode onset: 1 month. The problem occurs constantly. The problem has been gradually worsening. The pain is associated with lifting heavy objects and twisting. The pain is present in the thoracic spine. The quality of the pain is described as aching and cramping. The pain is moderate. The symptoms are aggravated by twisting and certain positions (and deep breathing). Pertinent negatives include no chest pain, no numbness and no abdominal pain. She has tried NSAIDs for the symptoms. The treatment provided moderate relief.    History reviewed. No pertinent past medical history.  Patient Active Problem List   Diagnosis Date Noted  . Abdominal pain, other specified site 09/18/2013  . Malnutrition of moderate degree ([place]) 09/11/2013  . Chest pain 09/10/2013  . LOW BACK PAIN, CHRONIC 12/12/2007  . DIVERTICULOSIS, COLON W/O HEM 08/25/2007  . INSOMNIA, PERSISTENT 08/19/2007  . RESTLESS LEG SYNDROME, SEVERE 08/19/2007  . GERD 04/15/2007    Past Surgical History:  Procedure Laterality Date  . ABDOMINAL HYSTERECTOMY      OB History    No data available       Home Medications    Prior to Admission medications   Medication Sig Start Date End Date Taking? Authorizing Provider  IMIPRAMINE HCL PO Take by mouth.   Yes [provider]  cyclobenzaprine (FLEXERIL) 10 MG tablet Take 1 tablet (10 mg total) by mouth at bedtime. 04/16/17 04/26/17  Fatima Blank, MD  LORazepam (ATIVAN) 1 MG tablet Take 2 tablets (2 mg total) by mouth at bedtime. 09/12/13   Robbie Lis, MD  Multiple Vitamin (MULTIVITAMIN WITH MINERALS) TABS tablet Take 1 tablet by mouth daily.    [provider]  naproxen (NAPROSYN) 375 MG tablet Take 1 tablet (375 mg total) by mouth 2 (two) times daily. 04/16/17   Fatima Blank, MD    Family History No family history on file.  Social History Social History  Substance Use Topics  . Smoking status: Never Smoker  . Smokeless tobacco: Never Used  . Alcohol use Yes     Comment: socially     Allergies   Patient has no known allergies.   Review of Systems Review of Systems  Cardiovascular: Negative for chest pain.  Gastrointestinal: Negative for abdominal pain.  Musculoskeletal: Positive for back pain.  Neurological: Negative for numbness.   All other systems are reviewed and are negative for acute change except as noted in the HPI   Physical Exam Updated Vital Signs BP (!) 145/101 (BP Location: Left Arm)   Pulse 95   Temp 97.8 F (36.6 C) (Oral)   Resp 20   SpO2 99%   Physical Exam  Constitutional: She is oriented to person, place, and time. She appears well-developed and well-nourished. No distress.  HENT:  Head: Normocephalic and atraumatic.  Nose: Nose normal.  Eyes: Conjunctivae and EOM are normal. Pupils are equal, round, and reactive to light. Right eye exhibits no discharge. Left eye exhibits no discharge. No scleral icterus.  Neck: Normal range of motion. Neck supple.  Cardiovascular: Normal rate and regular rhythm.  Exam reveals no gallop and no friction rub.  No murmur heard. Pulmonary/Chest: Effort normal and breath sounds normal. No stridor. No respiratory distress. She has no rales.  Abdominal: Soft. She exhibits no distension. There is no tenderness.  Musculoskeletal: She exhibits no edema.       Cervical back: She exhibits tenderness.       Back:  Notable muscle spasm to the right periscapular muscle groups which on palpation exacerbates her symptoms/pain.  Neurological: She is alert and oriented to person, place, and time.  Skin: Skin is warm and dry. No rash noted. She is not  diaphoretic. No erythema.  Psychiatric: She has a normal mood and affect.  Vitals reviewed.    ED Treatments / Results  Labs (all labs ordered are listed, but only abnormal results are displayed) Labs Reviewed - No data to display  EKG  EKG Interpretation None       Radiology No results found.  Procedures Procedures (including critical care time)  Medications Ordered in ED Medications - No data to display   Initial Impression / Assessment and Plan / ED Course  I have reviewed the triage vital signs and the nursing notes.  Pertinent labs & imaging results that were available during my care of the patient were reviewed by me and considered in my medical decision making (see chart for details).     Presentation consistent with muscular strain/spasm of the right periscapular muscles. Symptomatic treatment discussed with the patient. We'll prescribe an NSAID and muscle relaxer.  Symptoms are not concerning for cardiac etiology or pulmonary embolism. No infectious symptoms concerning for pneumonia.  The patient is safe for discharge with strict return precautions.   Final Clinical Impressions(s) / ED Diagnoses   Final diagnoses:  Muscle strain of right upper back, initial encounter   Disposition: Discharge  Condition: Good  I have discussed the results, Dx and Tx plan with the patient who expressed understanding and agree(s) with the plan. Discharge instructions discussed at great length. The patient was given strict return precautions who verbalized understanding of the instructions. No further questions at time of discharge.    New Prescriptions   CYCLOBENZAPRINE (FLEXERIL) 10 MG TABLET    Take 1 tablet (10 mg total) by mouth at bedtime.   NAPROXEN (NAPROSYN) 375 MG TABLET    Take 1 tablet (375 mg total) by mouth 2 (two) times daily.    Follow Up: Alroy Dust, L.Marlou Sa, [place] [address] [place] [address] [phone]  Schedule an appointment  as soon as possible for a visit  in 5-7 days, If symptoms do not improve or  worsen      Cardama, Grayce Sessions, MD 04/16/17 2249

## 2017-04-16 NOTE — ED Notes (Signed)
As pt was gathering things to get ready for d/c this RN noted pt had UDS paperwork from employer. Pt was unsure if this was needed. Pt contacted employer and confirmed that UDS was needed.

## 2017-04-16 NOTE — ED Triage Notes (Signed)
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

## 2017-05-03 ENCOUNTER — Emergency Department (HOSPITAL_BASED_OUTPATIENT_CLINIC_OR_DEPARTMENT_OTHER): Payer: BLUE CROSS/BLUE SHIELD

## 2017-05-03 ENCOUNTER — Encounter (HOSPITAL_BASED_OUTPATIENT_CLINIC_OR_DEPARTMENT_OTHER): Payer: Self-pay | Admitting: *Deleted

## 2017-05-03 ENCOUNTER — Emergency Department (HOSPITAL_BASED_OUTPATIENT_CLINIC_OR_DEPARTMENT_OTHER)
Admission: EM | Admit: 2017-05-03 | Discharge: 2017-05-03 | Disposition: A | Discharge status: Home / Self Care | Payer: BLUE CROSS/BLUE SHIELD | Attending: Emergency Medicine | Admitting: Emergency Medicine

## 2017-05-03 DIAGNOSIS — R109 Unspecified abdominal pain: Secondary | ICD-10-CM | POA: Insufficient documentation

## 2017-05-03 DIAGNOSIS — R339 Retention of urine, unspecified: Secondary | ICD-10-CM | POA: Diagnosis present

## 2017-05-03 DIAGNOSIS — N390 Urinary tract infection, site not specified: Secondary | ICD-10-CM

## 2017-05-03 LAB — BASIC METABOLIC PANEL
Anion gap: 9 (ref 5–15)
BUN: 13 mg/dL (ref 6–20)
CHLORIDE: 101 mmol/L (ref 101–111)
CO2: 29 mmol/L (ref 22–32)
Calcium: 10.3 mg/dL (ref 8.9–10.3)
Creatinine, Ser: 1.18 mg/dL — ABNORMAL HIGH (ref 0.44–1.00)
GFR calc non Af Amer: 47 mL/min — ABNORMAL LOW (ref >60)
GFR, EST AFRICAN AMERICAN: 55 mL/min — AB (ref >60)
Glucose, Bld: 145 mg/dL — ABNORMAL HIGH (ref 65–99)
Potassium: 3.2 mmol/L — ABNORMAL LOW (ref 3.5–5.1)
SODIUM: 139 mmol/L (ref 135–145)

## 2017-05-03 LAB — URINALYSIS, ROUTINE W REFLEX MICROSCOPIC
Glucose, UA: NEGATIVE mg/dL
Ketones, ur: NEGATIVE mg/dL
NITRITE: NEGATIVE
PH: 5.5 (ref 5.0–8.0)
Protein, ur: 100 mg/dL — AB
SPECIFIC GRAVITY, URINE: 1.024 (ref 1.005–1.030)

## 2017-05-03 LAB — URINALYSIS, MICROSCOPIC (REFLEX)

## 2017-05-03 MED ORDER — CEPHALEXIN 500 MG PO CAPS: 500.0000 mg | ORAL_CAPSULE | Freq: Two times a day (BID) | ORAL | 0 refills | Status: DC

## 2017-05-03 MED ORDER — CEPHALEXIN 250 MG PO CAPS
500.0000 mg | ORAL_CAPSULE | Freq: Once | ORAL | Status: AC
Start: 2017-05-03 — End: 2017-05-03
  Administered 2017-05-03: 500 mg via ORAL
  Filled 2017-05-03: qty 2

## 2017-05-03 NOTE — ED Triage Notes (Signed)
Pt c/o freq painful urination , just completed  cipro po x3 days ago for DX UTI

## 2017-05-03 NOTE — Discharge Instructions (Addendum)
Your creatinine and sugar were elevated in the emergency department. This needs to be rechecked by her primary care physician.

## 2017-05-03 NOTE — ED Provider Notes (Signed)
[facility] Provider Note   CSN: [id number] Arrival date & time: 05/03/17  1726  By signing my name below, I, Dora Sims, attest that this documentation has been prepared under the direction and in the presence of physician practitioner, Malvin Johns, MD. Electronically Signed: Dora Sims, Scribe. 05/03/2017. 6:03 PM.  History   Chief Complaint Chief Complaint  Patient presents with  . Urinary Retention   The history is provided by the patient. No language interpreter was used.    HPI Comments: Karyme Mcconathy is a 66 y.o. female with PMHx including UTI who presents to the Emergency Department complaining of persistent urinary retention for a few hours. She states she produced a small amount of urine a few minutes ago and noticed some blood in it. Patient also reports some associated lower abdominal discomfort/pressure. She is concerned for an acute UTI; patient experienced some persistent dysuria two weeks ago and was subsequently diagnosed with a UTI by her primary care doctor. She was placed on a 5 day course of Cipro and completed it as instructed with resolution of her symptoms. Patient states her urinary retention and abdominal pressure suddenly presented today and notes these symptoms are inconsistent with her prior UTI's. She denies any dysuria today and notes she did not have difficulty urinating this morning. No h/o kidney stones. Patient denies back pain, nausea, vomiting, fevers, chills, or any other associated symptoms.  History reviewed. No pertinent past medical history.  Patient Active Problem List   Diagnosis Date Noted  . Abdominal pain, other specified site 09/18/2013  . Malnutrition of moderate degree ([place]) 09/11/2013  . Chest pain 09/10/2013  . LOW BACK PAIN, CHRONIC 12/12/2007  . DIVERTICULOSIS, COLON W/O HEM 08/25/2007  . INSOMNIA, PERSISTENT 08/19/2007  . RESTLESS LEG SYNDROME, SEVERE 08/19/2007  . GERD 04/15/2007    Past Surgical History:    Procedure Laterality Date  . ABDOMINAL HYSTERECTOMY      OB History    No data available       Home Medications    Prior to Admission medications   Medication Sig Start Date End Date Taking? Authorizing Provider  IMIPRAMINE HCL PO Take by mouth.    [provider]  LORazepam (ATIVAN) 1 MG tablet Take 2 tablets (2 mg total) by mouth at bedtime. 09/12/13   Robbie Lis, MD  Multiple Vitamin (MULTIVITAMIN WITH MINERALS) TABS tablet Take 1 tablet by mouth daily.    [provider]  naproxen (NAPROSYN) 375 MG tablet Take 1 tablet (375 mg total) by mouth 2 (two) times daily. 04/16/17   Fatima Blank, MD    Family History History reviewed. No pertinent family history.  Social History Social History  Substance Use Topics  . Smoking status: Never Smoker  . Smokeless tobacco: Never Used  . Alcohol use Yes     Comment: socially     Allergies   Patient has no known allergies.   Review of Systems Review of Systems  Constitutional: Negative for chills, diaphoresis, fatigue and fever.  HENT: Negative for congestion, rhinorrhea and sneezing.   Eyes: Negative.   Respiratory: Negative for cough, chest tightness and shortness of breath.   Cardiovascular: Negative for chest pain and leg swelling.  Gastrointestinal: Positive for abdominal pain. Negative for blood in stool, diarrhea, nausea and vomiting.  Genitourinary: Positive for difficulty urinating and hematuria. Negative for dysuria, flank pain and frequency.  Musculoskeletal: Negative for arthralgias and back pain.  Skin: Negative for rash.  Neurological: Negative for dizziness,  speech difficulty, weakness, numbness and headaches.   Physical Exam Updated Vital Signs BP 138/65   Pulse 100   Temp 97.8 F (36.6 C) (Oral)   Resp 16   Ht 5\' 4"  (1.626 m)   Wt 140 lb (63.5 kg)   SpO2 100%   BMI 24.03 kg/m   Physical Exam  Constitutional: She is oriented to person, place, and time. She appears  well-developed and well-nourished.  HENT:  Head: Normocephalic and atraumatic.  Eyes: Pupils are equal, round, and reactive to light.  Neck: Normal range of motion. Neck supple.  Cardiovascular: Normal rate, regular rhythm and normal heart sounds.   Pulmonary/Chest: Effort normal and breath sounds normal. No respiratory distress. She has no wheezes. She has no rales. She exhibits no tenderness.  Abdominal: Soft. Bowel sounds are normal. There is tenderness. There is no rebound and no guarding.  Mild suprapubic tenderness.  Musculoskeletal: Normal range of motion. She exhibits no edema.  Lymphadenopathy:    She has no cervical adenopathy.  Neurological: She is alert and oriented to person, place, and time.  Skin: Skin is warm and dry. No rash noted.  Psychiatric: She has a normal mood and affect.   ED Treatments / Results  Labs (all labs ordered are listed, but only abnormal results are displayed) Labs Reviewed  URINALYSIS, [place] PANEL    EKG  EKG Interpretation None       Radiology No results found.  Procedures Procedures (including critical care time)  DIAGNOSTIC STUDIES: Oxygen Saturation is 100% on RA, normal by my interpretation.    COORDINATION OF CARE: 5:53 PM Discussed treatment plan with pt at bedside and pt agreed to plan.  Medications Ordered in ED Medications - No data to display   Initial Impression / Assessment and Plan / ED Course  I have reviewed the triage vital signs and the nursing notes.  Pertinent labs & imaging results that were available during my care of the patient were reviewed by me and considered in my medical decision making (see chart for details).     Patient presents with bladder pain and difficulty with urination. Her bladder scan was negative for significant retention. She only had 16 mL's of urine in her bladder. There was blood as well as infection present on her urinalysis. Given this, a  CT scan was performed which showed no evidence of ureteral stones. There was stones in her kidneys which I did advise the patient. She was started on antibiotics. She was encouraged to have follow-up with her PCP. She was advised that her creatinine and glucose are mildly elevated and need to be rechecked by her PCP. She also was advised that her blood pressures have been elevated and needs to be rechecked. Return precautions were given.  Final Clinical Impressions(s) / ED Diagnoses   Final diagnoses:  None    New Prescriptions New Prescriptions   No medications on file   I personally performed the services described in this documentation, which was scribed in my presence.  The recorded information has been reviewed and considered.    Malvin Johns, MD 05/03/17 2330

## 2017-05-03 NOTE — ED Notes (Signed)
ED Provider at bedside. 

## 2017-05-03 NOTE — ED Notes (Signed)
Bladder area is soft and non tender.   Bladder scan done.

## 2017-08-05 LAB — HEPATIC FUNCTION PANEL
ALT: 15 (ref 7–35)
AST: 25 (ref 13–35)
Alkaline Phosphatase: 122 (ref 25–125)
Bilirubin, Total: 0.6

## 2017-08-05 LAB — CBC AND DIFFERENTIAL
HCT: 45 (ref 36–46)
HEMOGLOBIN: 14.8 (ref 12.0–16.0)
PLATELETS: 205 (ref 150–399)
WBC: 3.9

## 2017-08-05 LAB — BASIC METABOLIC PANEL
BUN: 16 (ref 4–21)
Creatinine: 0.9 (ref 0.5–1.1)
Glucose: 107
Potassium: 4.4 (ref 3.4–5.3)
Sodium: 144 (ref 137–147)

## 2017-12-09 LAB — TSH: TSH: 3.35 (ref <=5.90)

## 2017-12-30 ENCOUNTER — Ambulatory Visit: Payer: BLUE CROSS/BLUE SHIELD | Admitting: Family Medicine

## 2017-12-31 ENCOUNTER — Telehealth: Payer: Self-pay | Admitting: Family Medicine

## 2017-12-31 ENCOUNTER — Ambulatory Visit (INDEPENDENT_AMBULATORY_CARE_PROVIDER_SITE_OTHER): Payer: Medicare HMO

## 2017-12-31 ENCOUNTER — Encounter: Payer: Self-pay | Admitting: Family Medicine

## 2017-12-31 ENCOUNTER — Ambulatory Visit (INDEPENDENT_AMBULATORY_CARE_PROVIDER_SITE_OTHER): Payer: Medicare HMO | Admitting: Family Medicine

## 2017-12-31 VITALS — BP 132/80 | HR 81 | Temp 97.5°F | Ht 64.0 in | Wt 168.8 lb

## 2017-12-31 DIAGNOSIS — M25512 Pain in left shoulder: Secondary | ICD-10-CM | POA: Diagnosis not present

## 2017-12-31 DIAGNOSIS — K219 Gastro-esophageal reflux disease without esophagitis: Secondary | ICD-10-CM | POA: Diagnosis not present

## 2017-12-31 LAB — H. PYLORI ANTIBODY, IGG: H PYLORI IGG: NEGATIVE

## 2017-12-31 MED ORDER — PANTOPRAZOLE SODIUM 40 MG PO TBEC: 40.0000 mg | DELAYED_RELEASE_TABLET | Freq: Every day | ORAL | 3 refills | Status: DC

## 2017-12-31 NOTE — Progress Notes (Signed)
Subjective:  Lindsay Reyes is a 66 y.o. female who presents today with a chief complaint of reflux, shoulder pain, and to establish care.   HPI:  Gastric Reflux, New problem Several year history.  Symptoms worsened over the last few weeks to months.  She has tried several medications most.  Most recently, she has been on over-the-counter Nexium which is helped her symptoms.  However, symptoms have worsened over the last couple of weeks.  Occasional epigastric bloating sensation.  No early satiety.  No weight loss.  No nausea or vomiting.  Symptoms are worse at night.  No other obvious alleviating or aggravating factors.  Left Shoulder Pain, new problem Symptoms started about a year ago.  No obvious precipitating events.  No traumas, falls, or other injuries.  Symptoms have worsened recently without clear explanation.  Pain is mostly located to the lateral aspect of her left shoulder.  Occasionally gets mechanical symptoms such as locking and popping.  Pain is worse with certain movements.  She has no history of trauma to the area however notes that she has fallen several times over the past several years.  No other obvious alleviating or aggravating factors.  ROS: Per HPI, otherwise a 10 point review of systems was performed and was negative  PMH:  The following were reviewed and entered/updated in epic: Past Medical History:  Diagnosis Date  . GERD (gastroesophageal reflux disease)    Patient Active Problem List   Diagnosis Date Noted  . LOW BACK PAIN, CHRONIC 12/12/2007  . DIVERTICULOSIS, COLON W/O HEM 08/25/2007  . GERD 04/15/2007   Past Surgical History:  Procedure Laterality Date  . ABDOMINAL HYSTERECTOMY      History reviewed. No pertinent family history.  Medications- reviewed and updated Current Outpatient Medications  Medication Sig Dispense Refill  . esomeprazole (NEXIUM) 20 MG capsule Take 20 mg by mouth daily at 12 noon.    . Multiple Vitamin (MULTIVITAMIN)  tablet Take 1 tablet by mouth daily.    . pantoprazole (PROTONIX) 40 MG tablet Take 1 tablet (40 mg total) by mouth daily. 30 tablet 3   No current facility-administered medications for this visit.     Allergies-reviewed and updated No Known Allergies  Social History   Socioeconomic History  . Marital status: Single    Spouse name: None  . Number of children: None  . Years of education: None  . Highest education level: None  Social Needs  . Financial resource strain: None  . Food insecurity - worry: None  . Food insecurity - inability: None  . Transportation needs - medical: None  . Transportation needs - non-medical: None  Occupational History  . None  Tobacco Use  . Smoking status: Never Smoker  . Smokeless tobacco: Never Used  Substance and Sexual Activity  . Alcohol use: Yes    Comment: socially  . Drug use: No  . Sexual activity: None  Other Topics Concern  . None  Social History Narrative  . None     Objective:  Physical Exam: BP 132/80 (BP Location: Left Arm, Patient Position: Sitting, Cuff Size: Normal)   Pulse 81   Temp (!) 97.5 F (36.4 C) (Oral)   Ht 5\' 4"  (1.626 m)   Wt 168 lb 12.8 oz (76.6 kg)   SpO2 95%   BMI 28.97 kg/m   Gen: NAD, resting comfortably CV: RRR with no murmurs appreciated Pulm: NWOB, CTAB with no crackles, wheezes, or rhonchi GI: Normal bowel sounds present. Soft, Nontender,  Nondistended. MSK:  -Left shoulder: No deformities.  Tender to palpation over biceps tendon.  Full range of motion in all directions.  Pain elicited with crossover test.  Supraspinatus testing with 5 out of 5 strength and no tenderness.  Normal internal and external rotation.  O'Brien's test positive.  Speeds test positive. Skin: Warm, dry Neuro: Grossly normal, moves all extremities Psych: Normal affect and thought content  Assessment/Plan:  GERD Check H. pylori.  Start Protonix.  No red flag signs or symptoms.  Discussed that this medication may worsen  osteoporosis/osteopenia if on for lengthy periods of time.  Will consider future referral to GI if symptoms persist and her H. pylori antibody is negative.  Left shoulder pain Concern for labral tear and/or biceps tendinitis.  Her rotator cuff seems to be intact based on physical exam today.  We will check an x-ray to rule out osteo-arthritis or other bony abnormalities.  She will follow-up with sports medicine early next week.  Preventative healthcare Obtain records from prior PCP regarding her hepatitis C, HIV, colonoscopy, DEXA and pneumonia vaccines.  Has had screening lab work done within the last year.  Algis Greenhouse. [name], MD 12/31/2017 12:43 PM

## 2017-12-31 NOTE — Assessment & Plan Note (Signed)
Check H. pylori.  Start Protonix.  No red flag signs or symptoms.  Discussed that this medication may worsen osteoporosis/osteopenia if on for lengthy periods of time.  Will consider future referral to GI if symptoms persist and her H. pylori antibody is negative.

## 2017-12-31 NOTE — Patient Instructions (Addendum)
We will check blood work today.  Please start the acid blocker.  You should be taking 800IU of vitamin D and 1000mg  of calcium daily.  We will check an xray today. Please come back to see Dr Paulla Fore next week.  Take care, Dr [name]

## 2017-12-31 NOTE — Telephone Encounter (Signed)
Pt. Will have [facility] right [facility].

## 2017-12-31 NOTE — Telephone Encounter (Signed)
Copied from [place] [phone]. Topic: Quick Communication - See Telephone Encounter >> Dec 31, 2017  1:33 PM Synthia Innocent wrote: CRM for notification. See Telephone encounter for:  Prescription sent today to the wrong pharmacy, [facility] 12/31/17.

## 2018-01-05 ENCOUNTER — Ambulatory Visit: Payer: Self-pay | Admitting: Sports Medicine

## 2018-01-06 ENCOUNTER — Ambulatory Visit: Payer: Medicare HMO | Admitting: Sports Medicine

## 2018-01-10 ENCOUNTER — Encounter: Payer: Self-pay | Admitting: Physical Therapy

## 2018-01-13 ENCOUNTER — Encounter: Payer: Self-pay | Admitting: Sports Medicine

## 2018-01-18 ENCOUNTER — Encounter: Payer: Self-pay | Admitting: Family Medicine

## 2018-01-18 ENCOUNTER — Ambulatory Visit (INDEPENDENT_AMBULATORY_CARE_PROVIDER_SITE_OTHER): Payer: Medicare HMO | Admitting: Family Medicine

## 2018-01-18 VITALS — BP 130/96 | HR 93 | Temp 97.5°F | Resp 97 | Ht 64.0 in | Wt 167.4 lb

## 2018-01-18 DIAGNOSIS — R32 Unspecified urinary incontinence: Secondary | ICD-10-CM | POA: Diagnosis not present

## 2018-01-18 DIAGNOSIS — Z1231 Encounter for screening mammogram for malignant neoplasm of breast: Secondary | ICD-10-CM

## 2018-01-18 DIAGNOSIS — Z1239 Encounter for other screening for malignant neoplasm of breast: Secondary | ICD-10-CM

## 2018-01-18 LAB — POCT URINALYSIS DIPSTICK
BILIRUBIN UA: NEGATIVE
Blood, UA: NEGATIVE
GLUCOSE UA: NEGATIVE
KETONES UA: NEGATIVE
Leukocytes, UA: NEGATIVE
NITRITE UA: NEGATIVE
Protein, UA: NEGATIVE
SPEC GRAV UA: 1.02 (ref 1.010–1.025)
Urobilinogen, UA: 0.2 E.U./dL
pH, UA: 7 (ref 5.0–8.0)

## 2018-01-18 MED ORDER — MIRABEGRON ER 25 MG PO TB24: 25.0000 mg | ORAL_TABLET | Freq: Every day | ORAL | 11 refills | Status: DC

## 2018-01-18 NOTE — Patient Instructions (Addendum)
Please start the Myrbetriq.  I also would like for you to see pelvic rehab. Please give them a call to schedule  I will also order your mammogram.  We will you order your bone density scan when you are due for.  Take care, Dr. [name]

## 2018-01-18 NOTE — Progress Notes (Signed)
    Subjective:  Lindsay Reyes is a 67 y.o. female who presents today for same-day appointment with a chief complaint of incontinence.   HPI:  Urinary Incontinence, New Issue Symptoms started a couple of years ago.  Worsened over that time.  She describes episodes where she will have a sudden urge to need to get to the restroom however is not able to make it.  Also describes some urine leakage with cough and sneezing.  No bowel incontinence.  No dysuria.  No hematuria.  She has tried pelvic tilt exercises as well as Kegel exercises which have not helped significantly.  No vaginal discharge or vaginal bleeding.  No other obvious alleviating or aggravating factors.  ROS: Per HPI  PMH: She reports that  has never smoked. she has never used smokeless tobacco. She reports that she drinks alcohol. She reports that she does not use drugs.  Objective:  Physical Exam: BP (!) 130/96 (BP Location: Left Arm, Cuff Size: Normal)   Pulse 93   Temp (!) 97.5 F (36.4 C) (Oral)   Resp (!) 97   Ht 5\' 4"  (1.626 m)   Wt 167 lb 6.4 oz (75.9 kg)   BMI 28.73 kg/m   Gen: NAD, resting comfortably GU: Deferred per patient request  Results for orders placed or performed in visit on 01/18/18 (from the past 24 hour(s))  POCT urinalysis dipstick     Status: None   Collection Time: 01/18/18 11:31 AM  Result Value Ref Range   Color, UA Yellow    Clarity, UA Clear    Glucose, UA negative    Bilirubin, UA negative    Ketones, UA negative    Spec Grav, UA 1.020 1.010 - 1.025   Blood, UA negative    pH, UA 7.0 5.0 - 8.0   Protein, UA negative    Urobilinogen, UA 0.2 0.2 or 1.0 E.U./dL   Nitrite, UA negative    Leukocytes, UA Negative Negative   Appearance     Odor     Assessment/Plan:  Urinary incontinence Pelvic exam deferred today.  Her UA is normal without any significant abnormalities.  She has a mixed picture of both stress and urge incontinence.  We will start Myrbetriq to treat her urge  incontinence.  Patient will be seeing pelvic floor rehab.  Consider referral to urology or gynecology if symptoms not controlled with above management.  Preventive healthcare Mammogram ordered.  Still awaiting records from previous PCP regarding other preventative healthcare.  Algis Greenhouse. [name], MD 01/18/2018 12:19 PM

## 2018-01-18 NOTE — Assessment & Plan Note (Signed)
Pelvic exam deferred today.  Her UA is normal without any significant abnormalities.  She has a mixed picture of both stress and urge incontinence.  We will start Myrbetriq to treat her urge incontinence.  Patient will be seeing pelvic floor rehab.  Consider referral to urology or gynecology if symptoms not controlled with above management.

## 2018-01-19 ENCOUNTER — Telehealth: Payer: Self-pay | Admitting: Family Medicine

## 2018-01-19 NOTE — Telephone Encounter (Signed)
Copied from [place]. Topic: Quick Communication - Rx Refill/Question >> Jan 19, 2018 11:07 AM Antonieta Iba C wrote: Medication:  mirabegron ER    Has the patient contacted their pharmacy? Yes--- pt says that medication is very expensive she would like to know if provider could send in a different medication that is less expensive?    (Agent: If no, request that the patient contact the pharmacy for the refill.)   Preferred Pharmacy (with phone number or street name): [id number] [place] [place]   Agent: Please be advised that RX refills may take up to 3 business days. We ask that you follow-up with your pharmacy.

## 2018-01-19 NOTE — Telephone Encounter (Signed)
Please see message and advise 

## 2018-01-19 NOTE — Telephone Encounter (Signed)
See note

## 2018-01-20 ENCOUNTER — Other Ambulatory Visit: Payer: Self-pay

## 2018-01-20 MED ORDER — OXYBUTYNIN CHLORIDE ER 10 MG PO TB24: 10.0000 mg | ORAL_TABLET | Freq: Every day | ORAL | 5 refills | Status: DC

## 2018-01-20 NOTE — Telephone Encounter (Signed)
Pt calling and states that the ditropan xl will be over $20 and she would like to go down to the tier 3 option. Requesting the tier 3 rx be called in.

## 2018-01-20 NOTE — Telephone Encounter (Signed)
LM for patient to return call.  We do not know what the "tier 3" option is.  Patient will need to speak with her insurance company and provide [place] with this information.  CRM created.

## 2018-01-20 NOTE — Telephone Encounter (Signed)
Please advise 

## 2018-01-20 NOTE — Telephone Encounter (Signed)
Please send in ditropan XL 10mg  daily. Dispense 30 with 5 refills.

## 2018-01-20 NOTE — Telephone Encounter (Signed)
Rx has been sent to patient's pharmacy. 

## 2018-01-24 ENCOUNTER — Ambulatory Visit (INDEPENDENT_AMBULATORY_CARE_PROVIDER_SITE_OTHER): Payer: Medicare HMO | Admitting: Sports Medicine

## 2018-01-24 ENCOUNTER — Encounter: Payer: Self-pay | Admitting: Sports Medicine

## 2018-01-24 ENCOUNTER — Ambulatory Visit: Payer: Self-pay

## 2018-01-24 VITALS — BP 130/88 | HR 90 | Ht 64.0 in | Wt 176.4 lb

## 2018-01-24 DIAGNOSIS — M25512 Pain in left shoulder: Secondary | ICD-10-CM | POA: Diagnosis not present

## 2018-01-24 DIAGNOSIS — G8929 Other chronic pain: Secondary | ICD-10-CM

## 2018-01-24 DIAGNOSIS — M19019 Primary osteoarthritis, unspecified shoulder: Secondary | ICD-10-CM | POA: Diagnosis not present

## 2018-01-24 NOTE — Progress Notes (Signed)
Lindsay Reyes. Lindsay Reyes, [place] [place]  Lindsay Reyes - 67 y.o. female MRN [id number]  Date of birth: 04/13/1951  Visit Date: 01/24/2018  PCP: [name], MD   Referred by: [name], MD   Scribe for today's visit: Josepha Pigg, CMA     SUBJECTIVE:  Lindsay Reyes is here for New Patient (Initial Visit) (LT shoulder pain) .  Referred by: Dr. Dimas Chyle Her LT shoulder pain symptoms INITIALLY: Began about 1 years ago and MOI is unknown. She thinks it may be d/t doing push-ups.  Described as mild tightness, nonradiating Worsened with rolling over in bed, raising arms overhead. At times the pain will be present when she is resting and she can see the shoulder move when it snaps.  No alleviating factors.  Additional associated symptoms include: she has noticed locking and popping in the joint. She says that it feels like a tightness, loosens up, and then snap. Pain is located near the bicep. About 1 week ago she noticed pain on the medial aspect of the LT hand. She denies swelling, tingling, or numbness in the LT hand. She denies neck pain. She has noticed decreased ROM in the LT shoulder.     At this time symptoms show no change compared to onset  She has tried icing the shoulder but doesn't feel that this is beneficial because its not really painful - feels like 2 bones that are supposed to be jointed but glide past one another.   LT shoulder xray done 12/31/17   ROS Reports night time disturbances. Denies fevers, chills, or night sweats. Denies unexplained weight loss. Denies personal history of cancer. Denies changes in bowel or bladder habits. Denies recent unreported falls. Denies new or worsening dyspnea or wheezing. Denies headaches or dizziness.  Denies numbness, tingling or weakness  In the extremities.  Denies dizziness or presyncopal episodes Denies lower extremity edema     HISTORY & PERTINENT  PRIOR DATA:  Prior History reviewed and updated per electronic medical record.  Significant history, findings, studies and interim changes include:  reports that  has never smoked. she has never used smokeless tobacco. No results for input(s): HGBA1C, LABURIC, CREATINE in the last 8760 hours. No specialty comments available. Problem  Shoulder Arthritis    OBJECTIVE:  VS:  HT:5\' 4"  (162.6 cm)   WT:176 lb 6.4 oz (80 kg)  BMI:30.26    BP:130/88  HR:90bpm  TEMP: ( )  RESP:96 %   PHYSICAL EXAM: Constitutional: WDWN, Non-toxic appearing. Psychiatric: Alert & appropriately interactive.  Not depressed or anxious appearing. Respiratory: No increased work of breathing.  Trachea Midline Eyes: Pupils are equal.  EOM intact without nystagmus.  No scleral icterus  NEUROVASCULAR exam: No clubbing or cyanosis appreciated No significant venous stasis changes Capillary Refill: normal, less than 2 seconds   Left shoulder has crepitation and pain with axial load and circumduction localizing to the glenohumeral joint.  Intrinsic rotator cuff strength is intact.  She has 15-20 degrees of loss of range of motion on the left compared to the right.  Her sensation is intact light touch in bilateral upper extremities and no significant pain with brachial plexus squeeze or arm squeeze test.  No additional findings.   ASSESSMENT & PLAN:   1. Chronic left shoulder pain   2. Shoulder arthritis    PLAN: Discussed multiple options today including the possibility for left shoulder injection but she would like to  hold off on this.  Ultrasound to confirm that her rotator cuff appears to be intact but she has generalized swelling and degenerative arthritis that is contributing to her pain.  Follow-up as needed for injections but I would like to see if she is progressing with her therapeutic exercises and working with a trainer in 6 weeks.  We discussed the possibility of NSAID therapy as well as injection  therapy which she would like to hold off on any pharmacologic intervention at this time.  No problem-specific Assessment & Plan notes found for this encounter.   ++++++++++++++++++++++++++++++++++++++++++++ Orders & Meds: Orders Placed This Encounter  Procedures  . [place] MSK POCT ULTRASOUND    No orders of the defined types were placed in this encounter.   ++++++++++++++++++++++++++++++++++++++++++++ Follow-up: Return in about 6 weeks (around 03/07/2018).   Pertinent documentation may be included in additional procedure notes, imaging studies, problem based documentation and patient instructions. Please see these sections of the encounter for additional information regarding this visit. CMA/ATC served as Education administrator during this visit. History, Physical, and Plan performed by medical provider. Documentation and orders reviewed and attested to.      Gerda Diss, [facility] Physician

## 2018-01-24 NOTE — Procedures (Signed)
LIMITED MSK ULTRASOUND OF LEFT SHOULDER Images were obtained and interpreted by myself, Teresa Coombs, DO  Images have been saved and stored to PACS system. Images obtained on: GE S7 Ultrasound machine  FINDINGS:  Biceps Tendon: Chronically thickened but intact with chronic Tendinopathic changes Pec Major Insertion: Normal Subscapularis Tendon: Intact however some small amount of interstitial splitting. Supraspinatus Tendon: Intact with small amount of interstitial splitting. Infraspinatus/Teres Minor Tendon: Normal AC Joint: Degenerative spurring JOINT: Moderate degenerative spurring of the glenohumeral joint  LABRUM: Chronically thickened    IMPRESSION:  1. Left shoulder with grossly intact rotator cuff with a small amount of interstitial splitting of the supraspinatus and subscapularis tendons with underlying moderate glenohumeral degenerative arthritis and AC joint arthritis that is moderate.

## 2018-01-24 NOTE — Patient Instructions (Addendum)
You should avoid any type of overhead activities when work out with the exception of Lindsay Reyes. We have given you exercises per your handout and below  Hopping has been shown to improve bone mineral density, helping with osteoporosis and osteopenia.  Hopping for 30 seconds 3-4 times per day is the minimum to see improvements.  You can consider adding in things like jump rope.  Please perform the exercise program that we have prepared for you and gone over in detail on a daily basis.  In addition to the handout you were provided you can access your program through: [email or web address]   Your unique program code is: [email or web address]

## 2018-01-24 NOTE — Procedures (Signed)
PROCEDURE NOTE: THERAPEUTIC EXERCISES (97110) 15 minutes spent for Therapeutic exercises as below and as referenced in the AVS. This included exercises focusing on stretching, strengthening, with significant focus on eccentric aspects.  Proper technique shown and discussed handout in great detail with ATC. All questions were discussed and answered.   Long term goals include an improvement in range of motion, strength, endurance as well as avoiding reinjury. Frequency of visits is one time as determined during today's  office visit. Frequency of exercises to be performed is as per handout.  EXERCISES REVIEWED:  Shoulder stabilzation exercises  GH Isometrics  Scapular stablization

## 2018-01-26 ENCOUNTER — Telehealth: Payer: Self-pay | Admitting: Family Medicine

## 2018-01-26 NOTE — Telephone Encounter (Signed)
LM for patient to return call.

## 2018-01-26 NOTE — Telephone Encounter (Signed)
Patient called to check on status of mammogram referral. There was not a referral in epic. Contact patient with an update.   Copied from [place] [phone]. Topic: Referral - Status >> Jan 26, 2018  5:01 PM Cecelia Byars, NT wrote: Reason for CRM: Patient called to check on status of her referral for a mammogram

## 2018-01-27 NOTE — Telephone Encounter (Signed)
Mammogram was ordered at last visit.  [facility].  Will call patient to let her know to call and schedule.

## 2018-01-27 NOTE — Telephone Encounter (Signed)
Called and informed patient that the mammogram order was placed at her last visit and tried to provide phone number so she could call and schedule her mammogram appointment.  Patient stated "no, that's not the way it's ever been done in the past and I'm not doing that now so I will just go without the mammogram" and hung up on me.

## 2018-02-25 ENCOUNTER — Ambulatory Visit: Payer: Self-pay | Admitting: Family Medicine

## 2018-03-01 ENCOUNTER — Encounter: Payer: Self-pay | Admitting: Family Medicine

## 2018-03-01 ENCOUNTER — Ambulatory Visit (INDEPENDENT_AMBULATORY_CARE_PROVIDER_SITE_OTHER): Payer: Medicare HMO | Admitting: Family Medicine

## 2018-03-01 DIAGNOSIS — G4700 Insomnia, unspecified: Secondary | ICD-10-CM | POA: Diagnosis not present

## 2018-03-01 DIAGNOSIS — F321 Major depressive disorder, single episode, moderate: Secondary | ICD-10-CM

## 2018-03-01 DIAGNOSIS — F419 Anxiety disorder, unspecified: Secondary | ICD-10-CM

## 2018-03-01 MED ORDER — NORTRIPTYLINE HCL 25 MG PO CAPS
25.0000 mg | ORAL_CAPSULE | Freq: Every day | ORAL | 11 refills | Status: DC
Start: 2018-03-01 — End: 2018-06-30

## 2018-03-01 NOTE — Assessment & Plan Note (Signed)
PHQ 9 significantly elevated today.  This is likely contributing to her underlying symptoms.  It is difficult to tell if her insomnia is leading to depression of her depression is leading to insomnia.  Will start treatment with Pamelor.  Start at 25 mg daily.  Increase to 50 mg in about a week if tolerating well without side effects.  Increase to 75 mg a week after that if tolerating well without side effects.  She will follow-up with me in 3-4 weeks.

## 2018-03-01 NOTE — Assessment & Plan Note (Signed)
See depression A/P.  GAD significantly elevated.  Start Pamelor.  Titrate to 75 mg as tolerated.  Follow-up with me in 3-4 weeks.

## 2018-03-01 NOTE — Assessment & Plan Note (Signed)
As noted above, it is unclear how much of her depression is contributing this or if her insomnia is actually causing depression.  We will treat both with Pamelor.  As noted above, we will start at 25 mg daily and titrate to 75 mg nightly as tolerated.  She will follow-up with me in 3-4 weeks.  If no improvement, will consider addition of atypical antipsychotic such as Seroquel or Abilify or maximizing dose of Pamelor at 150 mg daily.

## 2018-03-01 NOTE — Progress Notes (Signed)
    Subjective:  Lindsay Reyes is a 67 y.o. female who presents today with a chief complaint of insomnia.   HPI:  Insomnia, chronic problem, new to this provider.   Patient with several year history of insomnia.  She has seen several physicians for this.  She has also had a couple of sleep studies done that were unrevealing.  She has been on several medications in the past including amitriptyline and Ambien which have not significantly seem to help.  Patient mostly has difficulty staying asleep.  She is able to fall asleep, however wakes up about 2-3 hours after this.  Then has significant difficulty falling asleep again.  She feels very tired throughout the day.  This is also impacting her mood and ability to function daily.  She has not noticed anything make her symptoms better or worse.  Symptoms have been stable for the past several years.  Depression screen PHQ 2/9 03/01/2018  Decreased Interest 3  Down, Depressed, Hopeless 3  PHQ - 2 Score 6  Altered sleeping 3  Tired, decreased energy 3  Change in appetite 2  Feeling bad or failure about yourself  3  Trouble concentrating 2  Moving slowly or fidgety/restless 3  Suicidal thoughts 3  PHQ-9 Score 25  Difficult doing work/chores Extremely dIfficult    GAD 7 : Generalized Anxiety Score 03/01/2018  Nervous, Anxious, on Edge 1  Control/stop worrying 2  Worry too much - different things 2  Trouble relaxing 2  Restless 3  Easily annoyed or irritable 2  Afraid - awful might happen 2  Total GAD 7 Score 14  Anxiety Difficulty Extremely difficult    ROS: Per HPI  PMH: She reports that she has never smoked. She has never used smokeless tobacco. She reports that she drinks alcohol. She reports that she does not use drugs.  Objective:  Physical Exam: BP 136/88 (BP Location: Left Arm)   Pulse 83   Temp 97.9 F (36.6 C)   Wt 165 lb (74.8 kg)   SpO2 96%   BMI 28.32 kg/m   Gen: NAD, resting comfortably Neuro: Grossly normal,  moves all extremities Psych: Blunted affect and thought content  Assessment/Plan:  Depression, major, single episode, moderate (HCC) PHQ 9 significantly elevated today.  This is likely contributing to her underlying symptoms.  It is difficult to tell if her insomnia is leading to depression of her depression is leading to insomnia.  Will start treatment with Pamelor.  Start at 25 mg daily.  Increase to 50 mg in about a week if tolerating well without side effects.  Increase to 75 mg a week after that if tolerating well without side effects.  She will follow-up with me in 3-4 weeks.  Anxiety See depression A/P.  GAD significantly elevated.  Start Pamelor.  Titrate to 75 mg as tolerated.  Follow-up with me in 3-4 weeks.  Insomnia As noted above, it is unclear how much of her depression is contributing this or if her insomnia is actually causing depression.  We will treat both with Pamelor.  As noted above, we will start at 25 mg daily and titrate to 75 mg nightly as tolerated.  She will follow-up with me in 3-4 weeks.  If no improvement, will consider addition of atypical antipsychotic such as Seroquel or Abilify or maximizing dose of Pamelor at 150 mg daily.  Algis Greenhouse. [name], MD 03/01/2018 12:22 PM

## 2018-03-04 ENCOUNTER — Telehealth: Payer: Self-pay | Admitting: Family Medicine

## 2018-03-04 NOTE — Telephone Encounter (Signed)
Our office had not previously been contacted by the pharmacy and we were not aware this medication needed prior authorization.  Prior authorization is in process.  Now waiting for insurance decision.

## 2018-03-04 NOTE — Telephone Encounter (Signed)
PA has been approved.  Patient's pharmacy has been notified. 

## 2018-03-04 NOTE — Telephone Encounter (Signed)
Copied from [place] [id number]. Topic: Quick Communication - Rx Refill/Question >> Mar 04, 2018 11:33 AM Waylan Rocher, L[place] L wrote: Medication: nortriptyline (PAMELOR) 25 MG capsule Has the patient contacted their pharmacy? Yes.   (Agent: If no, request that the patient contact the pharmacy for the refill.) Preferred Pharmacy (with phone number or street name): [id number] [address], [place] - [place] [address] [place] [address] Phone: [phone] Fax: [phone] Agent: Please be advised that RX refills may take up to 3 business days. We ask that you follow-up with your pharmacy.  Pastient was prescribed this post visit on 03/01/2018, however her pharm told her it needs prior auth and they have not heard anything back from Dr. [name]. Patient would like a call to check on the status of getting the script approved.

## 2018-03-07 ENCOUNTER — Encounter: Payer: Self-pay | Admitting: Sports Medicine

## 2018-03-07 ENCOUNTER — Ambulatory Visit (INDEPENDENT_AMBULATORY_CARE_PROVIDER_SITE_OTHER): Payer: Medicare HMO | Admitting: Sports Medicine

## 2018-03-07 VITALS — BP 124/86 | HR 90 | Ht 64.0 in | Wt 166.2 lb

## 2018-03-07 DIAGNOSIS — G8929 Other chronic pain: Secondary | ICD-10-CM

## 2018-03-07 DIAGNOSIS — M25512 Pain in left shoulder: Secondary | ICD-10-CM

## 2018-03-07 DIAGNOSIS — M19019 Primary osteoarthritis, unspecified shoulder: Secondary | ICD-10-CM | POA: Diagnosis not present

## 2018-03-07 NOTE — Progress Notes (Signed)
Lindsay Reyes. Lindsay Reyes, Lindsay Reyes  Lindsay Reyes - 67 y.o. female MRN [id number]  Date of birth: October 31, 1951  Visit Date: 03/07/2018  PCP: [name], MD   Referred by: [name], MD  Scribe for today's visit: Josepha Pigg, CMA     SUBJECTIVE:  Lindsay Reyes is here for Follow-up (L shoulder pain)  01/24/18: Her LT shoulder pain symptoms INITIALLY: Began about 1 years ago and MOI is unknown. She thinks it may be d/t doing push-ups.  Described as mild tightness, nonradiating Worsened with rolling over in bed, raising arms overhead. At times the pain will be present when she is resting and she can see the shoulder move when it snaps.  No alleviating factors.  Additional associated symptoms include: she has noticed locking and popping in the joint. She says that it feels like a tightness, loosens up, and then snap. Pain is located near the bicep. About 1 week ago she noticed pain on the medial aspect of the LT hand. She denies swelling, tingling, or numbness in the LT hand. She denies neck pain. She has noticed decreased ROM in the LT shoulder.  At this time symptoms show no change compared to onset  She has tried icing the shoulder but doesn't feel that this is beneficial because its not really painful - feels like 2 bones that are supposed to be jointed but glide past one another.  LT shoulder xray done 12/31/17  03/07/2018: Compared to the last office visit, her previously described symptoms show no change.  Current symptoms are moderate & are nonradiating. She has noticed that the shoulder is popping less than it was before. ROM has improved. Pain is still triggered when she "make(s) a wrong move".  She has been doing home exercises but ran into some difficulty with the bands because they wont stay around her door. She has been working with a Clinical research associate at [employer] but she is on her own now.   ROS Denies night time  disturbances. Denies fevers, chills, or night sweats. Denies unexplained weight loss. Denies personal history of cancer. Denies changes in bowel or bladder habits. Denies recent unreported falls. Denies new or worsening dyspnea or wheezing. Denies headaches or dizziness.  Denies numbness, tingling or weakness  In the extremities.  Denies dizziness or presyncopal episodes Denies lower extremity edema    HISTORY & PERTINENT PRIOR DATA:  Prior History reviewed and updated per electronic medical record.  Significant/pertinent history, findings, studies include:  reports that she has never smoked. She has never used smokeless tobacco. No results for input(s): HGBA1C, LABURIC, CREATINE in the last 8760 hours. No specialty comments available. No problems updated.  OBJECTIVE:  VS:  HT:5\' 4"  (162.6 cm)   WT:166 lb 3.2 oz (75.4 kg)  BMI:28.51    BP:124/86  HR:90bpm  TEMP: ( )  RESP:94 %   PHYSICAL EXAM: Constitutional: WDWN, Non-toxic appearing. Psychiatric: Alert & appropriately interactive.  Not depressed or anxious appearing. Respiratory: No increased work of breathing.  Trachea Midline Eyes: Pupils are equal.  EOM intact without nystagmus.  No scleral icterus  Vascular Exam: warm to touch no edema  upper extremity neuro exam: unremarkable normal strength normal sensation normal reflexes  MSK Exam: Left shoulder: Full overhead range of motion with good internal and external range of motion and strength.  She has no pain with axial load and circumduction and overall no appreciable clicking or popping.  Significantly  improved in the past.  Small amount of scapular dyskinesis remains.   ASSESSMENT & PLAN:   1. Chronic left shoulder pain   2. Shoulder arthritis     PLAN: She is doing quite well and has had good progression in her symptoms.  I would like for her to continue with her home therapeutic exercises as well as with the personal trainer exercises that she is doing  and she will call us if she is interested in injection.  Otherwise continue with ice and over-the-counter anti-inflammatories.  Follow-up: Return for as needed for ongoing issues.      Please see additional documentation for Objective, Assessment and Plan sections. Pertinent additional documentation may be included in corresponding procedure notes, imaging studies, problem based documentation and patient instructions. Please see these sections of the encounter for additional information regarding this visit.  CMA/ATC served as Education administrator during this visit. History, Physical, and Plan performed by medical provider. Documentation and orders reviewed and attested to.      Gerda Diss, [facility] Physician

## 2018-04-01 ENCOUNTER — Encounter: Payer: Self-pay | Admitting: Family Medicine

## 2018-04-01 ENCOUNTER — Ambulatory Visit (INDEPENDENT_AMBULATORY_CARE_PROVIDER_SITE_OTHER): Payer: Medicare HMO | Admitting: Family Medicine

## 2018-04-01 VITALS — BP 120/80 | Temp 97.6°F | Ht 65.0 in | Wt 164.0 lb

## 2018-04-01 DIAGNOSIS — Z8614 Personal history of Methicillin resistant Staphylococcus aureus infection: Secondary | ICD-10-CM | POA: Insufficient documentation

## 2018-04-01 DIAGNOSIS — L989 Disorder of the skin and subcutaneous tissue, unspecified: Secondary | ICD-10-CM

## 2018-04-01 DIAGNOSIS — G4700 Insomnia, unspecified: Secondary | ICD-10-CM | POA: Diagnosis not present

## 2018-04-01 DIAGNOSIS — F419 Anxiety disorder, unspecified: Secondary | ICD-10-CM

## 2018-04-01 DIAGNOSIS — J309 Allergic rhinitis, unspecified: Secondary | ICD-10-CM | POA: Diagnosis not present

## 2018-04-01 DIAGNOSIS — E2839 Other primary ovarian failure: Secondary | ICD-10-CM | POA: Diagnosis not present

## 2018-04-01 DIAGNOSIS — F321 Major depressive disorder, single episode, moderate: Secondary | ICD-10-CM | POA: Diagnosis not present

## 2018-04-01 MED ORDER — TRAZODONE HCL 50 MG PO TABS: 25.0000 mg | ORAL_TABLET | Freq: Every evening | ORAL | 3 refills | Status: DC | PRN

## 2018-04-01 NOTE — Assessment & Plan Note (Signed)
Stable.  Continue Pamelor 75 mg nightly.  We will add trazodone 50 mg nightly to her regimen.  Would increase dose of Pamelor as tolerated.  Patient deferred addition of atypical antipsychotic due to associated side effects of weight gain and metabolic syndrome.

## 2018-04-01 NOTE — Progress Notes (Signed)
    Subjective:  Lindsay Reyes is a 67 y.o. female who presents today with a chief complaint of insomnia/depression follow up.   HPI:  Depression/Anxiety/insomnia, established problems, Stable Patient seen about a month ago for this.  She was started on nortriptyline 25 mg daily.  She has since been able to increase her dose to 75 mg daily.  She has not noticed a significant improvement in her symptoms.  She has had a little bit of a lot of dry mouth but otherwise no side effects.  Skin Lesion, new problem Located on posterior aspect of right calf.  Noticed it a few weeks ago.  Not irritated.  No obvious precipitating events.  Cough, chronic problem, new to this provider Patient with several year history of chronic cough.  She has been evaluated with a chest x-ray in the past without significant abnormality or clear underlying etiology.  Cough has changed in characteristic over the past week or 2.  She now notices more drainage and sinus congestion which she thinks is contributing.  She has not tried anything for this.  ROS: Per HPI  PMH: She reports that she has never smoked. She has never used smokeless tobacco. She reports that she drinks alcohol. She reports that she does not use drugs.   Objective:  Physical Exam: BP 120/80 (BP Location: Left Arm)   Temp 97.6 F (36.4 C) (Oral)   Ht 5\' 5"  (1.651 m)   Wt 164 lb (74.4 kg)   BMI 27.29 kg/m   Gen: NAD, resting comfortably HEENT: TMs clear bilaterally.  Oropharynx clear.  Nose mucosa erythematous and boggy bilaterally with clear nasal discharge.  Maxillary sinuses clear to transillumination bilaterally. CV: RRR with no murmurs appreciated Pulm: NWOB, CTAB with no crackles, wheezes, or rhonchi Skin: Approximately 3 mm erythematous patch on right posterior leg.  No surrounding erythema.  Assessment/Plan:  Insomnia Continue Pamelor 75 mg nightly.  Discussed treatment options.  Patient deferred starting atypical antipsychotic due to  association with weight gain and metabolic syndrome.  Will start trazodone 50 mg nightly.  If no improvement, would consider trial of hydroxyzine in the future.  May ultimately need referral to sleep specialist.  Depression, major, single episode, moderate ([place]) Stable.  Continue Pamelor 75 mg nightly.  We will add trazodone 50 mg nightly to her regimen.  Would increase dose of Pamelor as tolerated.  Patient deferred addition of atypical antipsychotic due to associated side effects of weight gain and metabolic syndrome.  Anxiety See depression A/P.  Allergic rhinitis Likely source of patient's underlying cough.  No red flag signs or symptoms.  Her lung exam is clear.  We will start Flonase today.  Would avoid antihistamines given that she is already having dry mouth with her antidepressant.  Skin lesion Appears to be irritated hair follicle.  We will continue with watchful waiting.  Consider punch biopsy if does not resolve over the next few weeks.  Preventative healthcare DEXA scan ordered.  Will obtain records from previous PCP.  Algis Greenhouse. [name], MD 04/01/2018 11:56 AM

## 2018-04-01 NOTE — Assessment & Plan Note (Signed)
Continue Pamelor 75 mg nightly.  Discussed treatment options.  Patient deferred starting atypical antipsychotic due to association with weight gain and metabolic syndrome.  Will start trazodone 50 mg nightly.  If no improvement, would consider trial of hydroxyzine in the future.  May ultimately need referral to sleep specialist.

## 2018-04-01 NOTE — Assessment & Plan Note (Signed)
See depression A/P. 

## 2018-04-01 NOTE — Patient Instructions (Addendum)
Please start the trazodone 50mg  at night.  Please try using Flonase to see if this helps with your cough and post nasal drip.  No other medication changes today.  I will order your bone density scan.  We will get records form Dr Nash Mantis office.  Take care, Dr [name]

## 2018-04-01 NOTE — Assessment & Plan Note (Signed)
Likely source of patient's underlying cough.  No red flag signs or symptoms.  Her lung exam is clear.  We will start Flonase today.  Would avoid antihistamines given that she is already having dry mouth with her antidepressant.

## 2018-04-29 ENCOUNTER — Ambulatory Visit (INDEPENDENT_AMBULATORY_CARE_PROVIDER_SITE_OTHER)
Admission: RE | Admit: 2018-04-29 | Discharge: 2018-04-29 | Disposition: A | Discharge status: Home / Self Care | Payer: Medicare HMO | Source: Ambulatory Visit | Attending: Family Medicine | Admitting: Family Medicine

## 2018-04-29 DIAGNOSIS — E2839 Other primary ovarian failure: Secondary | ICD-10-CM | POA: Diagnosis not present

## 2018-05-03 ENCOUNTER — Ambulatory Visit (INDEPENDENT_AMBULATORY_CARE_PROVIDER_SITE_OTHER): Payer: Medicare HMO | Admitting: Family Medicine

## 2018-05-03 ENCOUNTER — Encounter: Payer: Self-pay | Admitting: Family Medicine

## 2018-05-03 VITALS — BP 126/78 | HR 81 | Temp 97.5°F | Ht 65.0 in | Wt 162.2 lb

## 2018-05-03 DIAGNOSIS — M810 Age-related osteoporosis without current pathological fracture: Secondary | ICD-10-CM | POA: Insufficient documentation

## 2018-05-03 DIAGNOSIS — G4700 Insomnia, unspecified: Secondary | ICD-10-CM | POA: Diagnosis not present

## 2018-05-03 DIAGNOSIS — M8580 Other specified disorders of bone density and structure, unspecified site: Secondary | ICD-10-CM | POA: Diagnosis not present

## 2018-05-03 DIAGNOSIS — M533 Sacrococcygeal disorders, not elsewhere classified: Secondary | ICD-10-CM | POA: Diagnosis not present

## 2018-05-03 NOTE — Progress Notes (Signed)
    Subjective:  Lindsay Reyes is a 67 y.o. female who presents today with a chief complaint of insomnia.   HPI:  Insomnia, chronic problem Patient seen about a month ago.  At that time we started her on trazodone.  Unfortunately, this did not help with her symptoms.  She still had significant difficulty getting adequate amount of sleep at night.  Patient interested in possibly obtaining a card for "medical marijuana".  Coccygodynia, new problem Several year history.  Had an injection done about 8 months ago.  Worked well for her, however symptoms have returned recently.  Pain has worsened over the last several weeks to months.  Osteopenia, chronic problem Patient recently had bone density scan which showed stable bone density.  She is taking a multivitamin with calcium and vitamin D.  ROS: Per HPI  PMH: She reports that she has never smoked. She has never used smokeless tobacco. She reports that she drinks alcohol. She reports that she does not use drugs.   Objective:  Physical Exam: BP 126/78 (BP Location: Left Arm, Patient Position: Sitting, Cuff Size: Normal)   Pulse 81   Temp (!) 97.5 F (36.4 C) (Oral)   Ht 5\' 5"  (1.651 m)   Wt 162 lb 3.2 oz (73.6 kg)   SpO2 97%   BMI 26.99 kg/m   Gen: NAD, resting comfortably CV: RRR with no murmurs appreciated Pulm: NWOB, CTAB with no crackles, wheezes, or rhonchi Neuro: Grossly normal, moves all extremities Psych: Normal affect and thought content  Assessment/Plan:  Insomnia Patient has been on several medications for this including Lunesta, Ativan, Ambien, trazodone, and Belsomra which have not significantly improved her symptoms.  Discussed with patient at this point I have very limited options left.  I will place referral to sleep medicine for further management.  Discussed "medical marijuana" with patient.  Advised her that is currently not legal in [place] and additionally I am not trained to dispense medical marijuana.   Patient voiced understanding.  Coccygodynia Pain is worsening.  Unfortunately we are unable to do these injections in this office.  Advised her to follow-up with orthopedist for repeat injection.  Osteopenia Bone density scan shows stable FRAX scores.  We will continue her calcium and vitamin D supplementation.  No indication for bisphosphonates.  Repeat bone density scan in 2 years.  Algis Greenhouse. [name], MD 05/03/2018 12:16 PM

## 2018-05-03 NOTE — Assessment & Plan Note (Signed)
Bone density scan shows stable FRAX scores.  We will continue her calcium and vitamin D supplementation.  No indication for bisphosphonates.  Repeat bone density scan in 2 years.

## 2018-05-03 NOTE — Assessment & Plan Note (Signed)
Pain is worsening.  Unfortunately we are unable to do these injections in this office.  Advised her to follow-up with orthopedist for repeat injection.

## 2018-05-03 NOTE — Patient Instructions (Addendum)
It was nice to see you today.  Unfortunately, we are running out of medication options that I am familiar with help you with your sleep.  We will refer you to a sleep specialist today.  Please see your orthopedist to have a repeat injection done.  Dr. Paulla Fore does not perform those injections.  Your bone density scan did not show osteoporosis.  Please continue your vitamin D and calcium supplements.  You should be taking 1200 mg of calcium daily and 800 international units of vitamin D daily  Take care, Dr [name]

## 2018-05-03 NOTE — Assessment & Plan Note (Addendum)
Patient has been on several medications for this including Lunesta, Ativan, Ambien, trazodone, and Belsomra which have not significantly improved her symptoms.  Discussed with patient at this point I have very limited options left.  I will place referral to sleep medicine for further management.  Discussed "medical marijuana" with patient.  Advised her that is currently not legal in [place] and additionally I am not trained to dispense medical marijuana.  Patient voiced understanding.

## 2018-05-05 ENCOUNTER — Encounter: Payer: Self-pay | Admitting: *Deleted

## 2018-05-05 ENCOUNTER — Encounter: Payer: Self-pay | Admitting: Family Medicine

## 2018-06-30 ENCOUNTER — Ambulatory Visit: Payer: Medicare HMO | Admitting: Neurology

## 2018-06-30 ENCOUNTER — Encounter: Payer: Self-pay | Admitting: Neurology

## 2018-06-30 VITALS — BP 135/87 | HR 79 | Ht 64.0 in | Wt 159.0 lb

## 2018-06-30 DIAGNOSIS — F518 Other sleep disorders not due to a substance or known physiological condition: Secondary | ICD-10-CM | POA: Diagnosis not present

## 2018-06-30 DIAGNOSIS — G478 Other sleep disorders: Secondary | ICD-10-CM

## 2018-06-30 DIAGNOSIS — R441 Visual hallucinations: Secondary | ICD-10-CM | POA: Insufficient documentation

## 2018-06-30 DIAGNOSIS — F5104 Psychophysiologic insomnia: Secondary | ICD-10-CM

## 2018-06-30 MED ORDER — QUETIAPINE FUMARATE 25 MG PO TABS: 25.0000 mg | ORAL_TABLET | Freq: Every day | ORAL | 0 refills | Status: DC

## 2018-06-30 NOTE — Patient Instructions (Addendum)
Quetiapine tablets What is this medicine? QUETIAPINE (kwe TYE a peen) is an antipsychotic. It is used to treat schizophrenia and bipolar disorder, also known as manic-depression. This medicine may be used for other purposes; ask your health care provider or pharmacist if you have questions. COMMON BRAND NAME(S): Seroquel What should I tell my health care provider before I take this medicine? They need to know if you have any of these conditions: -brain tumor or head injury -breast cancer -cataracts -diabetes -difficulty swallowing -heart disease -kidney disease -liver disease -low blood counts, like low white cell, platelet, or red cell counts -low blood pressure or dizziness when standing up -Parkinson's disease -previous heart attack -seizures -suicidal thoughts, plans, or attempt by you or a family member -thyroid disease -an unusual or allergic reaction to quetiapine, other medicines, foods, dyes, or preservatives -pregnant or trying to get pregnant -breast-feeding How should I use this medicine? Take this medicine by mouth. Swallow it with a drink of water. Follow the directions on the prescription label. If it upsets your stomach you can take it with food. Take your medicine at regular intervals. Do not take it more often than directed. Do not stop taking except on the advice of your doctor or health care professional. A special MedGuide will be given to you by the pharmacist with each prescription and refill. Be sure to read this information carefully each time. Talk to your pediatrician regarding the use of this medicine in children. While this drug may be prescribed for children as young as 10 years for selected conditions, precautions do apply. Patients over age 50 years may have a stronger reaction to this medicine and need smaller doses. Overdosage: If you think you have taken too much of this medicine contact a poison control center or emergency room at once. NOTE: This  medicine is only for you. Do not share this medicine with others. What if I miss a dose? If you miss a dose, take it as soon as you can. If it is almost time for your next dose, take only that dose. Do not take double or extra doses. What may interact with this medicine? Do not take this medicine with any of the following medications: -certain medicines for fungal infections like fluconazole, itraconazole, ketoconazole, posaconazole, voriconazole -cisapride -dofetilide -dronedarone -droperidol -grepafloxacin -halofantrine -phenothiazines like chlorpromazine, mesoridazine, thioridazine -pimozide -sparfloxacin -ziprasidone This medicine may also interact with the following medications: -alcohol -antiviral medicines for HIV or AIDS -certain medicines for blood pressure -certain medicines for depression, anxiety, or psychotic disturbances like haloperidol, lorazepam -certain medicines for diabetes -certain medicines for Parkinson's disease -certain medicines for seizures like carbamazepine, phenobarbital, phenytoin -cimetidine -erythromycin -other medicines that prolong the QT interval (cause an abnormal heart rhythm) -rifampin -steroid medicines like prednisone or cortisone This list may not describe all possible interactions. Give your health care provider a list of all the medicines, herbs, non-prescription drugs, or dietary supplements you use. Also tell them if you smoke, drink alcohol, or use illegal drugs. Some items may interact with your medicine. What should I watch for while using this medicine? Visit your doctor or health care professional for regular checks on your progress. It may be several weeks before you see the full effects of this medicine. Your health care provider may suggest that you have your eyes examined prior to starting this medicine, and every 6 months thereafter. If you have been taking this medicine regularly for some time, do not suddenly stop taking it.  You must gradually  reduce the dose or your symptoms may get worse. Ask your doctor or health care professional for advice. Patients and their families should watch out for worsening depression or thoughts of suicide. Also watch out for sudden or severe changes in feelings such as feeling anxious, agitated, panicky, irritable, hostile, aggressive, impulsive, severely restless, overly excited and hyperactive, or not being able to sleep. If this happens, especially at the beginning of antidepressant treatment or after a change in dose, call your health care professional. Dennis Bast may get dizzy or drowsy. Do not drive, use machinery, or do anything that needs mental alertness until you know how this medicine affects you. Do not stand or sit up quickly, especially if you are an older patient. This reduces the risk of dizzy or fainting spells. Alcohol can increase dizziness and drowsiness. Avoid alcoholic drinks. Do not treat yourself for colds, diarrhea or allergies. Ask your doctor or health care professional for advice, some ingredients may increase possible side effects. This medicine can reduce the response of your body to heat or cold. Dress warm in cold weather and stay hydrated in hot weather. If possible, avoid extreme temperatures like saunas, hot tubs, very hot or cold showers, or activities that can cause dehydration such as vigorous exercise. What side effects may I notice from receiving this medicine? Side effects that you should report to your doctor or health care professional as soon as possible: -allergic reactions like skin rash, itching or hives, swelling of the face, lips, or tongue -difficulty swallowing -fast or irregular heartbeat -fever or chills, sore throat -fever with rash, swollen lymph nodes, or swelling of the face -increased hunger or thirst -increased urination -problems with balance, talking, walking -seizures -stiff muscles -suicidal thoughts or other mood  changes -uncontrollable head, mouth, neck, arm, or leg movements -unusually weak or tired Side effects that usually do not require medical attention (report to your doctor or health care professional if they continue or are bothersome): -change in sex drive or performance -constipation -drowsy or dizzy -dry mouth -stomach upset -weight gain This list may not describe all possible side effects. Call your doctor for medical advice about side effects. You may report side effects to FDA at 1-800-FDA-1088. Where should I keep my medicine? Keep out of the reach of children. Store at room temperature between 15 and 30 degrees C (59 and 86 degrees F). Throw away any unused medicine after the expiration date. NOTE: This sheet is a summary. It may not cover all possible information. If you have questions about this medicine, talk to your doctor, pharmacist, or health care provider.  2018 Elsevier/Gold Standard (2015-05-28 13:07:35) Please remember to try to maintain good sleep hygiene, which means: Keep a regular sleep and wake schedule, try not to exercise or have a meal within 2 hours of your bedtime, try to keep your bedroom conducive for sleep, that is, cool and dark, without light distractors such as an illuminated alarm clock, and refrain from watching TV right before sleep or in the middle of the night and do not keep the TV or radio on during the night. Also, try not to use or play on electronic devices at bedtime, such as your cell phone, tablet PC or laptop. If you like to read at bedtime on an electronic device, try to dim the background light as much as possible. Do not eat in the middle of the night.   We will request a sleep study.    We will look for leg twitching  and snoring or sleep apnea.   For chronic insomnia, you are best followed by a psychiatrist and/or sleep psychologist.   We will call you with the sleep study results and make a follow up appointment if needed.

## 2018-06-30 NOTE — Progress Notes (Signed)
SLEEP MED[facility]INE CL[facility]IC   Provider:  Larey Seat, [place] D  Primary Care Physician:  [name], MD   Referring Provider: [name], MD    Chief Complaint  Patient presents with  . New Patient (Initial Visit)    pt alone, rm 11. pt states that all her life she has had difficulty with sleeping. states that now she is older she has a harder time handling the lack of sleep.. pt states that she has not been to sleep since monday. she is tired, but unable to fall asleep. pt has tried trazadone and nortriptyline most recently to help with sleep but was unsucessful. pt has tried other medication but unable to remember the names. pt had 2 sleep studies completed and the last one was roughly 2 years ago.     HPI:  Lindsay Reyes is a 67 y.o. female , seen here in a referral  from Dr. [name] for chronic Insomnia, medication resistant.  The patient is a 67 year old Caucasian right-handed female who reports that she has a life long problem with insomnia, beginning in teenage. Since 67 years of age, lived at home. " I wanted to go to sleep more than anything in the world" .  She left school at age 57 years, and the insomnia started after she quit.  She has learning disabilities, but was never tested.  She was very moody- and was kicked out by her mother at age 87 or 62. She denies any euphoria, and never had cyclic sleepiness. She relied a lot on her older sister who has OSA.  She feels she has the same problem for decades, and this insomnia forced her to retirement , she was not safe to operate machinery.    Chief complaint according to patient : see above .  Sleep habits are as follows: she feels safe and well in her bedroom, uses a dim light, warm light in her bedroom hall way, and the bedroom is cool and quiet and dark. She lives alone. No electronics in the bedroom. She just can't go to sleep. She feels ready to sleep and likes to read in bed, but it seems never to come to sleep.    Hasn't slept since Monday , 3 days ago . 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 Followed by second shift 3 Pm to 11.30 for the last 12 years.    Sleep medical history and family sleep history: sister has OSA. Mother  Reported insomnia at night, hypersomnia in daytime.  Social history: single, no children, smoke- never, ETOH rare,  Caffeine - soda 2-3/ week- iced tea -none.  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 Followed by second shift 3 Pm to 11.30 for the last 12 years.   Review of Systems: Out of a complete 14 system review, the patient complains of only the following symptoms, and all other reviewed systems are negative.  Insomnia, defunct circadian rhythm.   Epworth score 5/ 24  , Fatigue severity score 57/ 63   , depression score 8/ 15 - positive for depression.    Social History   Socioeconomic History  . Marital status: Single    Spouse name: Not on file  . Number of children: Not on file  . Years of education: Not on file  . Highest education level: Not on file  Occupational History  . Not on file  Social Needs  . Financial resource strain: Not on file  . Food insecurity:    Worry: Not on file    Inability: Not on file  . Transportation needs:    Medical: Not on file    Non-medical: Not on file  Tobacco Use  . Smoking status: Never Smoker  . Smokeless tobacco: Never Used  Substance and Sexual Activity  . Alcohol use: Yes    Comment: socially  . Drug use: No  . Sexual activity: Not on file  Lifestyle  . Physical activity:    Days per week: Not on file    Minutes per session: Not on file  . Stress: Not on file  Relationships  . Social connections:    Talks on phone: Not on file    Gets together: Not on file    Attends religious service: Not on file    Active member of club or organization: Not on file    Attends meetings of clubs or organizations: Not on file     Relationship status: Not on file  . Intimate partner violence:    Fear of current or ex partner: Not on file    Emotionally abused: Not on file    Physically abused: Not on file    Forced sexual activity: Not on file  Other Topics Concern  . Not on file  Social History Narrative  . Not on file    Family History  Problem Relation Age of Onset  . Hypertension Mother   . Diabetes Mother   . Osteoporosis Mother   . Thyroid cancer Sister     Past Medical History:  Diagnosis Date  . Colon polyp   . Diverticulosis   . GERD (gastroesophageal reflux disease)   . Insomnia   . Retinal micro-aneurysm of right eye   . Scoliosis     Past Surgical History:  Procedure Laterality Date  . ABDOMINAL HYSTERECTOMY      Current Outpatient Medications  Medication Sig Dispense Refill  . esomeprazole (NEXIUM) 20 MG capsule Take 20 mg by mouth daily at 12 noon.    . Multiple Vitamin (MULTIVITAMIN) tablet Take 1 tablet by mouth daily.     No current facility-administered medications for this visit.     Allergies as of 06/30/2018  . (No Known Allergies)    Vitals: BP 135/87   Pulse 79   Ht 5\' 4"  (1.626 m)   Wt 159 lb (72.1 kg)   BMI 27.29 kg/m    Last Weight:  Wt Readings from Last 1 Encounters:  06/30/18 159 lb (72.1 kg)   ASN:KNLZ mass index is 27.29 kg/m.     Last Height:   Ht Readings from Last 1 Encounters:  06/30/18 5\' 4"  (1.626 m)    Physical exam:  General: The patient is awake, alert and appears not in acute distress. The patient is well groomed. She is restless, taps her feet, crosses and uncrosses her legs.  Head: Normocephalic, atraumatic. Neck is supple. Mallampati 3,  neck circumference:14.75 , Nasal airflow patent , TMJ click is not evident . Retrognathia is seen.  Cardiovascular:  Regular rate and rhythm , without  murmurs or carotid bruit, and without distended neck veins. Respiratory: Lungs are clear to auscultation. Skin:  Without evidence of edema, or  rash Trunk: BMI is 27 -" this is the biggest I have ever been ". The patient's posture is erect.   Neurologic exam : The patient is awake and alert, oriented to place and  time.   Attention span & concentration ability appears normal. She reports difficulties throughout her years of schooling, failed to obtain GED Speech is fluent,  without  dysarthria, dysphonia or aphasia.  Mood and affect are vigilant. Cranial nerves: Pupils are equal and briskly reactive to light. Funduscopic exam without evidence of pallor or edema. Extraocular movements  in vertical and horizontal planes intact and without nystagmus. Visual fields by finger perimetry are intact. Hearing to finger rub intact.  Facial sensation intact to fine touch. Facial motor strength is symmetric and tongue and uvula move midline. Shoulder shrug was symmetrical.   Motor exam:   Normal tone, muscle bulk and symmetric strength in all extremities. Sensory:  Fine touch, pinprick and vibration were tested in all extremities. Proprioception tested in the upper extremities was normal. Coordination:  Finger-to-nose maneuver  normal without evidence of ataxia, dysmetria or tremor. Gait and station: Patient walks without assistive device .Tandem gait is unfragmented. Turns with 3 Steps.  Deep tendon reflexes: in the  upper and lower extremities are symmetric and intact. Babinski maneuver response is downgoing.   Assessment:  After physical and neurologic examination, review of laboratory studies,  Personal review of imaging studies, reports of other /same  Imaging studies, results of polysomnography and / or neurophysiology testing and pre-existing records as far as provided in visit., my assessment is   1)  Chronic insomnia with less ability to compensate for it- but not likely organic in origin. Failed trazodone, Lunesta, Ambien, Benadryl , belsomra,  Had trouble to wean of lorazepam. fears addiction- had hallucinations, night terrors - has not  seen psychologist or psychiarist.  She can not recall seroquel   2) I would like to confirm that her sleep perception is intact. She is reporting fatigue - not EDS- cannot cross the border to sleep.   3) Not a prion disorder- FF insomnia, as onset was much too long ago.    The patient was advised of the nature of the diagnosed disorder , the treatment options and the  risks for general health and wellness arising from not treating the condition.   I spent more than 50 minutes of face to face time with the patient.  Greater than 50% of time was spent in counseling and coordination of care. We have discussed the diagnosis and differential and I answered the patient's questions.    Plan:  Treatment plan and additional workup :  Dear Dr. [name],   She has supposingly told you as her referring physician that she had a sleep study within the last 3 years, she reported.  The patient reports she had undergone an attended sleep study - and she was told she had a 7 hours of sleep, which she disputes. I was not informed that the patient had a sleep study within the last 3 years, at the corner of [address] at the time.   If I cannot get an attended sleep study approved, I would not be able to help this patient.  My role is to treat and identify organic sleep disorders. I do not manage psychological Insomnia.      Larey Seat, MD 2/70/3500, 93:81 AM  Certified in Neurology by ABPN Certified in [place] by [facility] [address] [place], [place] [address]

## 2018-07-13 ENCOUNTER — Telehealth: Payer: Self-pay

## 2018-07-13 NOTE — Telephone Encounter (Signed)
Insurance has denied in lab sleep study request.  Have notified Lindsay Reyes of denial. Please follow up with patient for what her next step is, per Dr. [name] instructions from last visit.

## 2018-07-13 NOTE — Telephone Encounter (Signed)
Referral back to pcp.

## 2018-07-14 ENCOUNTER — Ambulatory Visit (INDEPENDENT_AMBULATORY_CARE_PROVIDER_SITE_OTHER): Payer: Medicare HMO | Admitting: Family Medicine

## 2018-07-14 ENCOUNTER — Encounter: Payer: Self-pay | Admitting: Family Medicine

## 2018-07-14 VITALS — BP 128/74 | HR 86 | Temp 97.7°F | Ht 64.0 in | Wt 159.6 lb

## 2018-07-14 DIAGNOSIS — G4700 Insomnia, unspecified: Secondary | ICD-10-CM

## 2018-07-14 DIAGNOSIS — I878 Other specified disorders of veins: Secondary | ICD-10-CM | POA: Insufficient documentation

## 2018-07-14 MED ORDER — QUETIAPINE FUMARATE 25 MG PO TABS: 25.0000 mg | ORAL_TABLET | Freq: Every day | ORAL | 1 refills | Status: DC

## 2018-07-14 NOTE — Patient Instructions (Signed)
It was very nice to see you today!  I will refill your seroquel. I am glad to hear that it is working for you.  Please come back soon for your annual physical with blood work.   Take care, Dr [name]   Sclerotherapy Sclerotherapy is a procedure that is done to improve the appearance of varicose veins and spider veins and to help relieve aching, swelling, cramping, and pain in the legs. Varicose veins are veins that have become enlarged, bulging, and twisted due to a damaged valve that causes blood to collect (pool) in the veins. Spider veins are small varicose veins. Sclerotherapy usually works best for smaller spider and varicose veins. This procedure involves injecting a chemical into the vein to close it off. You may need more than one treatment to close a vein all the way. Sclerotherapy is usually performed on the legs because that is where varicose and spider veins most often occur. Tell a health care provider about:  Any allergies you have.  All medicines you are taking, including vitamins, herbs, eye drops, creams, and over-the-counter medicines.  Any blood disorders you have.  Any surgeries you have had.  Any medical conditions you have.  Whether you are pregnant or may be pregnant. What are the risks? Generally, this is a safe procedure. However, problems may occur, including:  Infection.  Bleeding.  Allergic reactions to medicines or dyes.  Blood clots.  Nerve damage.  Bruising and scarring.  Darkened skin around the area.  What happens before the procedure?  Do not use lotions or creams on your legs unless your health care provider approves.  Follow instructions from your health care provider about eating and drinking restrictions.  Do not use any products that contain nicotine or tobacco, such as cigarettes and e-cigarettes. If you need help quitting, ask your health care provider.  Ask your health care provider about: ? Changing or stopping your regular  medicines. This is especially important if you are taking diabetes medicines or blood thinners. ? Taking medicines such as aspirin and ibuprofen. These medicines can thin your blood. Do not take these medicines before your procedure if your health care provider instructs you not to.  You may have an ultrasound of the affected area to check for blood clots and to check blood flow.  In rare cases, you may have an X-ray procedure to check how blood flows through your veins (angiogram). For an angiogram, a dye is injected to outline your veins on X-rays. What happens during the procedure?  To lower your risk of infection: ? Your health care team will wash or sanitize their hands. ? Your skin will be washed with soap. ? Hair may be removed from the treatment area.  A small, thin needle will be used to inject a chemical (sclerosant) into your varicose vein. The sclerosant will irritate the lining of the vein and cause the vein to close below the injection site. You may feel some stinging, burning, or irritation.  The injection may be repeated for more than one varicose vein.  The injection area will be wrapped with elastic bandages. The procedure may vary among health care providers and hospitals. What happens after the procedure?  Your injection area will be wrapped with elastic bandages. If there is bleeding, the bandages may be changed.  Do not drive until your health care provider approves. You may need to wait 1-2 days before driving.  You will need to wear compression stockings for about a week, or  as long as your health care provider recommends. Summary  Sclerotherapy is a procedure that is done to improve the appearance of varicose veins and spider veins and to help relieve aching, swelling, cramping, and pain in the legs.  A small, thin needle is used to inject a chemical (sclerosant) into a spider vein or varicose vein to close it off.  Elastic bandages will be wrapped around the  injection area after the procedure. This information is not intended to replace advice given to you by your health care provider. Make sure you discuss any questions you have with your health care provider. Document Released: 01/12/2017 Document Revised: 01/12/2017 Document Reviewed: 01/12/2017 Elsevier Interactive Patient Education  2018 [employer].

## 2018-07-14 NOTE — Assessment & Plan Note (Signed)
Symptoms significantly improved on Seroquel.  I will give a 90-day supply of Seroquel 25 mg daily.  Discussed reasons to return to care.  Increase dose as needed.

## 2018-07-14 NOTE — Progress Notes (Signed)
   Subjective:  Lindsay Reyes is a 67 y.o. female who presents today with a chief complaint of insomnia.   HPI:  Insomnia, chronic problem, improving Patient seen by neurology couple weeks ago. She was started on seroquel 25mg  nightly. This has worked very well and she has noticed a significant improvement in her sleep pattern. She still has routine awakenings but overall feels like her sleep is much better.   Prominent Veins, New problem Located in her arms and hands bilaterally.  She is very concerned about the appearance.  No reported pain or irritation to the area.  No specific treatments tried.  No other obvious alleviating or aggravating factors.  ROS: Per HPI  PMH: She reports that she has never smoked. She has never used smokeless tobacco. She reports that she drinks alcohol. She reports that she does not use drugs.  Objective:  Physical Exam: BP 128/74 (BP Location: Left Arm, Patient Position: Sitting, Cuff Size: Normal)   Pulse 86   Temp 97.7 F (36.5 C) (Oral)   Ht 5\' 4"  (1.626 m)   Wt 159 lb 9.6 oz (72.4 kg)   SpO2 98%   BMI 27.40 kg/m   Gen: NAD, resting comfortably Skin: Prominent veins noted on dorsal aspects of hands bilaterally.  Assessment/Plan:  Prominent vein Reassured patient.  She is very concerned about cosmetic appearance.  Gave handout with information discussing possible treatment options including sclerotherapy or laser ablation.  She will look into this and let me know if she would like to be referred to a specialist for further management.  Insomnia Symptoms significantly improved on Seroquel.  I will give a 90-day supply of Seroquel 25 mg daily.  Discussed reasons to return to care.  Increase dose as needed.  Preventive health care Patient will contact imaging center for mammogram soon.  She is not interested in any further colonoscopies.  Algis Greenhouse. [name], MD 07/14/2018 12:03 PM

## 2018-07-14 NOTE — Assessment & Plan Note (Signed)
Reassured patient.  She is very concerned about cosmetic appearance.  Gave handout with information discussing possible treatment options including sclerotherapy or laser ablation.  She will look into this and let me know if she would like to be referred to a specialist for further management.

## 2018-07-18 ENCOUNTER — Telehealth: Payer: Self-pay

## 2018-07-18 NOTE — Telephone Encounter (Signed)
Re-contacted insurance about in lab denial.  Spoke with a Clinical reveiw nurse and she approved in lab request.  Pt is scheduled for her sleep study on 07/27/18 @ 8pm

## 2018-07-20 ENCOUNTER — Encounter (INDEPENDENT_AMBULATORY_CARE_PROVIDER_SITE_OTHER): Payer: Medicare HMO | Admitting: Neurology

## 2018-07-20 DIAGNOSIS — F518 Other sleep disorders not due to a substance or known physiological condition: Secondary | ICD-10-CM

## 2018-07-20 DIAGNOSIS — F5104 Psychophysiologic insomnia: Secondary | ICD-10-CM

## 2018-07-20 DIAGNOSIS — R441 Visual hallucinations: Secondary | ICD-10-CM

## 2018-07-20 DIAGNOSIS — Z0289 Encounter for other administrative examinations: Secondary | ICD-10-CM

## 2018-07-20 DIAGNOSIS — G478 Other sleep disorders: Secondary | ICD-10-CM

## 2018-07-21 ENCOUNTER — Telehealth: Payer: Self-pay

## 2018-07-21 NOTE — Telephone Encounter (Signed)
Patient came to sleep lab for PSG and could not get to sleep. After hook up she attempted to fall asleep and could not. After several hours patient wanted to go home. She signed AMA form.

## 2018-07-21 NOTE — Telephone Encounter (Signed)
Did she bring her Seroquel ? It was prescribed for exactly that purpuse. I couldn't use xanax/ ativan for medical history reasons.  I am sorry she had such a horrid time sleeping. CD

## 2018-07-26 NOTE — Telephone Encounter (Signed)
Yes she took her meds before and still could not sleep.

## 2018-09-09 ENCOUNTER — Encounter

## 2018-09-09 ENCOUNTER — Ambulatory Visit: Payer: Medicare HMO | Admitting: Family Medicine

## 2018-09-09 ENCOUNTER — Encounter: Payer: Self-pay | Admitting: Family Medicine

## 2018-09-09 DIAGNOSIS — Z0289 Encounter for other administrative examinations: Secondary | ICD-10-CM

## 2018-09-29 ENCOUNTER — Encounter: Payer: Self-pay | Admitting: Family Medicine

## 2018-09-29 ENCOUNTER — Ambulatory Visit (INDEPENDENT_AMBULATORY_CARE_PROVIDER_SITE_OTHER): Payer: Medicare HMO | Admitting: Family Medicine

## 2018-09-29 VITALS — BP 124/68 | HR 65 | Temp 98.4°F | Ht 64.0 in | Wt 160.8 lb

## 2018-09-29 DIAGNOSIS — R739 Hyperglycemia, unspecified: Secondary | ICD-10-CM | POA: Diagnosis not present

## 2018-09-29 DIAGNOSIS — F5104 Psychophysiologic insomnia: Secondary | ICD-10-CM | POA: Diagnosis not present

## 2018-09-29 DIAGNOSIS — Z0001 Encounter for general adult medical examination with abnormal findings: Secondary | ICD-10-CM

## 2018-09-29 DIAGNOSIS — Z1159 Encounter for screening for other viral diseases: Secondary | ICD-10-CM | POA: Diagnosis not present

## 2018-09-29 DIAGNOSIS — Z13220 Encounter for screening for lipoid disorders: Secondary | ICD-10-CM | POA: Diagnosis not present

## 2018-09-29 LAB — COMPREHENSIVE METABOLIC PANEL
ALT: 9 U/L (ref 0–35)
AST: 15 U/L (ref 0–37)
Albumin: 4.1 g/dL (ref 3.5–5.2)
Alkaline Phosphatase: 104 U/L (ref 39–117)
BUN: 14 mg/dL (ref 6–23)
CHLORIDE: 107 meq/L (ref 96–112)
CO2: 28 meq/L (ref 19–32)
Calcium: 10 mg/dL (ref 8.4–10.5)
Creatinine, Ser: 0.88 mg/dL (ref 0.40–1.20)
GFR: 68.24 mL/min (ref >60.00)
GLUCOSE: 99 mg/dL (ref 70–99)
POTASSIUM: 3.8 meq/L (ref 3.5–5.1)
Sodium: 142 mEq/L (ref 135–145)
Total Bilirubin: 0.5 mg/dL (ref 0.2–1.2)
Total Protein: 6.6 g/dL (ref 6.0–8.3)

## 2018-09-29 LAB — LIPID PANEL
CHOL/HDL RATIO: 4
Cholesterol: 199 mg/dL (ref 0–200)
HDL: 49 mg/dL (ref >39.00)
LDL CALC: 131 mg/dL — AB (ref 0–99)
NONHDL: 150.33
Triglycerides: 99 mg/dL (ref 0.0–149.0)
VLDL: 19.8 mg/dL (ref 0.0–40.0)

## 2018-09-29 LAB — CBC
HEMATOCRIT: 44.9 % (ref 36.0–46.0)
Hemoglobin: 14.9 g/dL (ref 12.0–15.0)
MCHC: 33.1 g/dL (ref 30.0–36.0)
MCV: 92.1 fl (ref 78.0–100.0)
PLATELETS: 174 10*3/uL (ref 150.0–400.0)
RBC: 4.87 Mil/uL (ref 3.87–5.11)
RDW: 12.6 % (ref 11.5–15.5)
WBC: 4.1 10*3/uL (ref 4.0–10.5)

## 2018-09-29 LAB — HEMOGLOBIN A1C: Hgb A1c MFr Bld: 5.3 % (ref 4.6–6.5)

## 2018-09-29 LAB — TSH: TSH: 4.56 u[IU]/mL — ABNORMAL HIGH (ref 0.35–4.50)

## 2018-09-29 MED ORDER — QUETIAPINE FUMARATE 25 MG PO TABS: 50.0000 mg | ORAL_TABLET | Freq: Every day | ORAL | 1 refills | Status: DC

## 2018-09-29 NOTE — Patient Instructions (Signed)
It was very nice to see you today!  We will check blood work today.  Please increase your Seroquel to 50 mg nightly.  No other changes today.  Please come back to see me in 1 year for your physical , or sooner as needed.  Take care, Dr [name]   Preventive Care 66 Years and Older, Female Preventive care refers to lifestyle choices and visits with your health care provider that can promote health and wellness. What does preventive care include?  A yearly physical exam. This is also called an annual well check.  Dental exams once or twice a year.  Routine eye exams. Ask your health care provider how often you should have your eyes checked.  Personal lifestyle choices, including: ? Daily care of your teeth and gums. ? Regular physical activity. ? Eating a healthy diet. ? Avoiding tobacco and drug use. ? Limiting alcohol use. ? Practicing safe sex. ? Taking low-dose aspirin every day. ? Taking vitamin and mineral supplements as recommended by your health care provider. What happens during an annual well check? The services and screenings done by your health care provider during your annual well check will depend on your age, overall health, lifestyle risk factors, and family history of disease. Counseling Your health care provider may ask you questions about your:  Alcohol use.  Tobacco use.  Drug use.  Emotional well-being.  Home and relationship well-being.  Sexual activity.  Eating habits.  History of falls.  Memory and ability to understand (cognition).  Work and work Statistician.  Reproductive health.  Screening You may have the following tests or measurements:  Height, weight, and BMI.  Blood pressure.  Lipid and cholesterol levels. These may be checked every 5 years, or more frequently if you are over 24 years old.  Skin check.  Lung cancer screening. You may have this screening every year starting at age 35 if you have a 30-pack-year history of  smoking and currently smoke or have quit within the past 15 years.  Fecal occult blood test (FOBT) of the stool. You may have this test every year starting at age 34.  Flexible sigmoidoscopy or colonoscopy. You may have a sigmoidoscopy every 5 years or a colonoscopy every 10 years starting at age 67.  Hepatitis C blood test.  Hepatitis B blood test.  Sexually transmitted disease (STD) testing.  Diabetes screening. This is done by checking your blood sugar (glucose) after you have not eaten for a while (fasting). You may have this done every 1-3 years.  Bone density scan. This is done to screen for osteoporosis. You may have this done starting at age 63.  Mammogram. This may be done every 1-2 years. Talk to your health care provider about how often you should have regular mammograms.  Talk with your health care provider about your test results, treatment options, and if necessary, the need for more tests. Vaccines Your health care provider may recommend certain vaccines, such as:  Influenza vaccine. This is recommended every year.  Tetanus, diphtheria, and acellular pertussis (Tdap, Td) vaccine. You may need a Td booster every 10 years.  Varicella vaccine. You may need this if you have not been vaccinated.  Zoster vaccine. You may need this after age 26.  Measles, mumps, and rubella (MMR) vaccine. You may need at least one dose of MMR if you were born in 1957 or later. You may also need a second dose.  Pneumococcal 13-valent conjugate (PCV13) vaccine. One dose is recommended after  age 59.  Pneumococcal polysaccharide (PPSV23) vaccine. One dose is recommended after age 52.  Meningococcal vaccine. You may need this if you have certain conditions.  Hepatitis A vaccine. You may need this if you have certain conditions or if you travel or work in places where you may be exposed to hepatitis A.  Hepatitis B vaccine. You may need this if you have certain conditions or if you travel or  work in places where you may be exposed to hepatitis B.  Haemophilus influenzae type b (Hib) vaccine. You may need this if you have certain conditions.  Talk to your health care provider about which screenings and vaccines you need and how often you need them. This information is not intended to replace advice given to you by your health care provider. Make sure you discuss any questions you have with your health care provider. Document Released: 12/20/2015 Document Revised: 08/12/2016 Document Reviewed: 09/24/2015 Elsevier Interactive Patient Education  [employer].

## 2018-09-29 NOTE — Assessment & Plan Note (Signed)
Increase Seroquel to 50 mg daily.  Check CBC, CMET, and TSH.

## 2018-09-29 NOTE — Progress Notes (Signed)
Subjective:  Lindsay Reyes is a 67 y.o. female who presents today for her annual comprehensive physical exam.    HPI:  She has no acute complaints today.   1. Insomnia.  Currently on Seroquel 25 mg nightly.  This helps modestly.  She is interested in increasing the dose.  No reported side effects.  Lifestyle Diet: No specific diets.  Exercise: No specific exercises.   Depression screen PHQ 2/9 03/01/2018  Decreased Interest 3  Down, Depressed, Hopeless 3  PHQ - 2 Score 6  Altered sleeping 3  Tired, decreased energy 3  Change in appetite 2  Feeling bad or failure about yourself  3  Trouble concentrating 2  Moving slowly or fidgety/restless 3  Suicidal thoughts 3  PHQ-9 Score 25  Difficult doing work/chores Extremely dIfficult    Health Maintenance Due  Topic Date Due  . Hepatitis C Screening  March 09, 1951     ROS: Positive for insomnia and increased urination, otherwise a complete review of systems was negative.   PMH:  The following were reviewed and entered/updated in epic: Past Medical History:  Diagnosis Date  . Colon polyp   . Diverticulosis   . GERD (gastroesophageal reflux disease)   . Insomnia   . Retinal micro-aneurysm of right eye   . Scoliosis    Patient Active Problem List   Diagnosis Date Noted  . Prominent vein 07/14/2018  . Chronic insomnia 06/30/2018  . 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  . Osteopenia 05/03/2018  . History of MRSA infection 04/01/2018  . Allergic rhinitis 04/01/2018  . Depression, major, single episode, moderate ([place]) 03/01/2018  . Anxiety 03/01/2018  . Insomnia 03/01/2018  . 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 Urinary incontinence 01/18/2018  . LOW BACK PAIN, CHRONIC 12/12/2007  . DIVERTICULOSIS, COLON W/O HEM 08/25/2007  . GERD 04/15/2007   Past Surgical History:  Procedure Laterality Date  . ABDOMINAL HYSTERECTOMY      Family History  Problem Relation Age of Onset  . Hypertension Mother   . Diabetes Mother   . Osteoporosis  Mother   . Thyroid cancer Sister     Medications- reviewed and updated Current Outpatient Medications  Medication Sig Dispense Refill  . esomeprazole (NEXIUM) 20 MG capsule Take 20 mg by mouth daily at 12 noon.    . Multiple Vitamin (MULTIVITAMIN) tablet Take 1 tablet by mouth daily.    . QUEtiapine (SEROQUEL) 25 MG tablet Take 1 tablet (25 mg total) by mouth at bedtime. 90 tablet 1   No current facility-administered medications for this visit.     Allergies-reviewed and updated No Known Allergies  Social History   Socioeconomic History  . Marital status: Single    Spouse name: Not on file  . Number of children: Not on file  . Years of education: Not on file  . Highest education level: Not on file  Occupational History  . Not on file  Social Needs  . Financial resource strain: Not on file  . Food insecurity:    Worry: Not on file    Inability: Not on file  . Transportation needs:    Medical: Not on file    Non-medical: Not on file  Tobacco Use  . Smoking status: Never Smoker  . Smokeless tobacco: Never Used  Substance and Sexual Activity  . Alcohol use: Yes    Comment: socially  . Drug use: No  . Sexual activity: Not on file  Lifestyle  . Physical activity:    Days per week: Not on file  Minutes per session: Not on file  . Stress: Not on file  Relationships  . Social connections:    Talks on phone: Not on file    Gets together: Not on file    Attends religious service: Not on file    Active member of club or organization: Not on file    Attends meetings of clubs or organizations: Not on file    Relationship status: Not on file  Other Topics Concern  . Not on file  Social History Narrative  . Not on file    Objective:  Physical Exam: BP 124/68 (BP Location: Left Arm, Patient Position: Sitting, Cuff Size: Normal)   Pulse 65   Temp 98.4 F (36.9 C) (Oral)   Ht 5\' 4"  (1.626 m)   Wt 160 lb 12.8 oz (72.9 kg)   SpO2 95%   BMI 27.60 kg/m   Body mass  index is 27.6 kg/m. Wt Readings from Last 3 Encounters:  09/29/18 160 lb 12.8 oz (72.9 kg)  07/14/18 159 lb 9.6 oz (72.4 kg)  06/30/18 159 lb (72.1 kg)  Gen: NAD, resting comfortably HEENT: TMs normal bilaterally. OP clear. No thyromegaly noted.  CV: RRR with no murmurs appreciated Pulm: NWOB, CTAB with no crackles, wheezes, or rhonchi GI: Normal bowel sounds present. Soft, Nontender, Nondistended. MSK: no edema, cyanosis, or clubbing noted Skin: warm, dry Neuro: CN2-12 grossly intact. Strength 5/5 in upper and lower extremities. Reflexes symmetric and intact bilaterally.  Psych: Normal affect and thought content  Assessment/Plan:  No problem-specific Assessment & Plan notes found for this encounter.  Preventative Healthcare: Check lipid panel and hepatitis C antibody.  Check A1c.  Patient Counseling(The following topics were reviewed and/or handout was given):  -Nutrition: Stressed importance of moderation in sodium/caffeine intake, saturated fat and cholesterol, caloric balance, sufficient intake of fresh fruits, vegetables, and fiber.  -Stressed the importance of regular exercise.   -Substance Abuse: Discussed cessation/primary prevention of tobacco, alcohol, or other drug use; driving or other dangerous activities under the influence; availability of treatment for abuse.   -Injury prevention: Discussed safety belts, safety helmets, smoke detector, smoking near bedding or upholstery.   -Sexuality: Discussed sexually transmitted diseases, partner selection, use of condoms, avoidance of unintended pregnancy and contraceptive alternatives.   -Dental health: Discussed importance of regular tooth brushing, flossing, and dental visits.  -Health maintenance and immunizations reviewed. Please refer to Health maintenance section.  Return to care in 1 year for next preventative visit.   Algis Greenhouse. [name], MD 09/29/2018 9:54 AM

## 2018-09-30 LAB — HEPATITIS C ANTIBODY
HEP C AB: NONREACTIVE
SIGNAL TO CUT-OFF: 0.03 (ref <1.00)

## 2018-09-30 NOTE — Progress Notes (Signed)
Please inform patient of the following:  Blood counts are normal.  Electrolytes, kidney function, liver function, and blood sugar levels are normal. Her "bad" cholesterol is a little elevated, but not at the point where we should start medications. She should continue working on diet and exercise and we can recheck in a year.  Her thyroid number was just a little off. This is problem nothing, but I would like for her to come back in a couple weeks for repeat testing. Please place order for future TSH, free T3, and free T4.  Algis Greenhouse. [name], MD 09/30/2018 2:52 PM

## 2018-10-03 ENCOUNTER — Ambulatory Visit (INDEPENDENT_AMBULATORY_CARE_PROVIDER_SITE_OTHER): Payer: Medicare HMO | Admitting: Family Medicine

## 2018-10-03 ENCOUNTER — Encounter: Payer: Self-pay | Admitting: Family Medicine

## 2018-10-03 VITALS — BP 126/72 | HR 88 | Temp 97.7°F | Ht 64.0 in | Wt 161.0 lb

## 2018-10-03 DIAGNOSIS — S91209A Unspecified open wound of unspecified toe(s) with damage to nail, initial encounter: Secondary | ICD-10-CM | POA: Diagnosis not present

## 2018-10-03 NOTE — Patient Instructions (Signed)
It was very nice to see you today!  I think your nail will fall off in the next few days.   Please come back later this week and we can retry taking the nail off.  Take care, Dr [name]   Nail Avulsion Nail avulsion is when a nail tears away from the nail bed due to an accident or an injury. Nail avulsion can be painful. Your finger or toe may bleed a lot, and you may have some pain, redness, throbbing, and swelling while it heals. Your nail will grow back within several months. Once it grows back, it might not look the same. This may happen even after taking good care of it. Follow these instructions at home: Wound care   Clean any dirt and debris from the wound.  If you notice bleeding, press gently on the nailbed with a gauze pad. Do this for 15 minutes.  If a health care provider closed your wound with stitches (sutures), leave them in place. They may need to stay in place for 2 weeks or longer. You may need to see your health care provider to have them removed.  Keep the wound dry for 48 hours. After 48 hours have passed, lightly wash the finger or toe in warm, soapy water 2-3 times a day. This helps to reduce pain and swelling and prevent infection. Dressing Care  Cover the wound with a clean gauze bandage (dressing). You may be able to stop wearing a dressing after 2?7 days.  Wash your hands with soap and water before you change your dressing. If soap and water are not available, use hand sanitizer.  Change the dressing once or twice a day. Always change the dressing: ? If the dressing gets wet or dirty. ? After washing your finger or toe. Medicine  Take over-the-counter pain medicine as needed. Do not take aspirin or products containing aspirin unless directed by your health care provider. These products can increase bleeding.  If you were prescribed an antibiotic medicine, take it or apply it as told by your health care provider. Do not stop taking or using the antibiotic  even if you start to feel better. General instructions  Keep the hand or foot with the nail injury raised above the level of your heart as much as possible. This helps to reduce pain and swelling.  Move the toe or finger often to avoid stiffness.  Do not smoke. Smoking can delay healing. If you need help quitting, talk to your health care provider. Contact a health care provider if:  You have swelling or pain that gets worse instead of better.  You have fluid, blood, or pus coming from your wound.  Your wound smells bad. Get help right away if:  You have bleeding that does not stop, even when you apply pressure to the wound.  You have a temperature that is higher than 104F (40C).  You cannot move your fingers or toes.  The affected finger or toe looks white or black. This information is not intended to replace advice given to you by your health care provider. Make sure you discuss any questions you have with your health care provider. Document Released: 12/31/2004 Document Revised: 07/23/2016 Document Reviewed: 04/10/2015 Elsevier Interactive Patient Education  [employer].

## 2018-10-03 NOTE — Progress Notes (Signed)
   Subjective:  Lindsay Reyes is a 67 y.o. female who presents today for same-day appointment with a chief complaint of toe pain.   HPI:  Toe Pain, Acute problem Started yesterday.  Patient opened her screen door when she caught her right great toenail at the door.  Noticed immediate pain and bleeding to the area.  She placed a bandage and was able to get the bleeding stopped, however the toe pain has persisted.  ROS: Per HPI  Objective:  Physical Exam: BP 126/72 (BP Location: Left Arm, Patient Position: Sitting, Cuff Size: Normal)   Pulse 88   Temp 97.7 F (36.5 C) (Oral)   Ht 5\' 4"  (1.626 m)   Wt 161 lb (73 kg)   SpO2 96%   BMI 27.64 kg/m   MSK: Avulsed nail of the right great toe.  Neurovascular intact distally.  Toenail Avulsion Procedure Note  Pre-operative Diagnosis: Right Great toe Onychomycosis/toenail avulsion  Post-operative Diagnosis: Same  Indications: Therapeutic  Anesthesia: Lidocaine 2% without epinephrine without added sodium bicarbonate  Procedure Details  History of allergy to iodine: no  The risks (including bleeding and infection) and benefits of the  procedure and Written informed consent obtained.  Digital block was applied to her right great toe.  After approximately 5 minutes, in attempt was made to remove patient's bandage from her great toe, however she was not able to tolerate due to pain.  The patient then elected to stop the procedure.   Assessment/Plan:  Toenail avulsion Attempted to remove now today, however patient was unable to complete the procedure due to inadequate anesthesia.  We will proceed with watchful waiting.  She will soak her foot at home and warm water.  Continue over-the-counter analgesics as needed.  Algis Greenhouse. [name], MD 10/03/2018 10:49 AM

## 2018-10-07 ENCOUNTER — Other Ambulatory Visit: Payer: Self-pay

## 2018-10-07 DIAGNOSIS — R7989 Other specified abnormal findings of blood chemistry: Secondary | ICD-10-CM

## 2018-10-12 ENCOUNTER — Other Ambulatory Visit (INDEPENDENT_AMBULATORY_CARE_PROVIDER_SITE_OTHER): Payer: Medicare HMO

## 2018-10-12 DIAGNOSIS — R7989 Other specified abnormal findings of blood chemistry: Secondary | ICD-10-CM

## 2018-10-12 LAB — T3, FREE: T3, Free: 2.7 pg/mL (ref 2.3–4.2)

## 2018-10-12 LAB — T4, FREE: Free T4: 0.71 ng/dL (ref 0.60–1.60)

## 2018-10-12 LAB — TSH: TSH: 5.31 u[IU]/mL — ABNORMAL HIGH (ref 0.35–4.50)

## 2018-10-13 NOTE — Progress Notes (Signed)
Please inform patient of the following:  Her thyroid numbers are still slightly off. Recommend endocrinology referral for further management.  Lindsay Reyes. [name], MD 10/13/2018 1:11 PM

## 2018-12-08 ENCOUNTER — Ambulatory Visit (INDEPENDENT_AMBULATORY_CARE_PROVIDER_SITE_OTHER): Payer: Medicare HMO | Admitting: Family Medicine

## 2018-12-08 ENCOUNTER — Encounter: Payer: Self-pay | Admitting: Family Medicine

## 2018-12-08 ENCOUNTER — Ambulatory Visit: Payer: Self-pay | Admitting: Family Medicine

## 2018-12-08 VITALS — BP 124/70 | HR 82 | Temp 97.8°F | Ht 64.0 in | Wt 161.0 lb

## 2018-12-08 DIAGNOSIS — M25552 Pain in left hip: Secondary | ICD-10-CM | POA: Diagnosis not present

## 2018-12-08 DIAGNOSIS — M533 Sacrococcygeal disorders, not elsewhere classified: Secondary | ICD-10-CM | POA: Diagnosis not present

## 2018-12-08 MED ORDER — DICLOFENAC SODIUM 75 MG PO TBEC: 75.0000 mg | DELAYED_RELEASE_TABLET | Freq: Two times a day (BID) | ORAL | 0 refills | Status: DC

## 2018-12-08 NOTE — Progress Notes (Signed)
   Subjective:  Lindsay Reyes is a 68 y.o. female who presents today for same-day appointment with a chief complaint of tailbone pain.   HPI:  Tailbone Pain, established problem, worsening Patient was last seen about a 6 months ago for this.  Since her last visit, she has seen both her orthopedist and physiatrist.  Symptoms have persisted and have worsened.  Symptoms are now radiating into her left hip.  Pain is much worse with sitting for long periods of time and with certain movements.  Reportedly she was told that she would need to have surgery done to the area however she would like to have further testing done to make sure that this is absolutely necessary as she does not want to have surgery.  ROS: Per HPI  PMH: She reports that she has never smoked. She has never used smokeless tobacco. She reports current alcohol use. She reports that she does not use drugs.  Objective:  Physical Exam: BP 124/70 (BP Location: Left Arm, Patient Position: Sitting, Cuff Size: Normal)   Pulse 82   Temp 97.8 F (36.6 C) (Oral)   Ht 5\' 4"  (1.626 m)   Wt 161 lb (73 kg)   SpO2 95%   BMI 27.64 kg/m   Gen: NAD, resting comfortably MSK: -Back: No deformities.  Tender to palpation along distal coccyx. -Left hip: No deformities.  Full range of motion throughout. FABER negative.  She has some weakness with hip abduction and extension compared to her right hip.  These maneuvers elicit some pain.  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  She has had plain films of her lumbar back at her orthopedist which shows scoliosis and spodylosis.  She does have some weakness on exam which elicits tenderness and may have some underlying muscular tear as well.  Given that her pain has been present for over a year and that her symptoms are worsening, will obtain pelvic MRI to rule out any  other potential causes such as malignancy, and also further evaluate for any possible muscular tear.  She will follow-up with sports medicine depending on results of her MRI.  We will start diclofenac 75 mg twice daily as needed.  Discussed reasons to return to care.   Algis Greenhouse. [name], MD 12/08/2018 2:31 PM

## 2018-12-08 NOTE — Patient Instructions (Signed)
It was very nice to see you today!  We will get an MRI to make sure you do not have any muscle tears or any other possible causes for your tailbone and hip pain.  Please take the diclofenac as needed.  Please come back and see Dr Paulla Fore after your MRI results.   Take care, Dr [name]

## 2018-12-14 ENCOUNTER — Encounter: Payer: Self-pay | Admitting: Sports Medicine

## 2018-12-14 ENCOUNTER — Ambulatory Visit (INDEPENDENT_AMBULATORY_CARE_PROVIDER_SITE_OTHER): Payer: Medicare HMO | Admitting: Sports Medicine

## 2018-12-14 VITALS — BP 110/76 | HR 90 | Ht 64.0 in | Wt 161.0 lb

## 2018-12-14 DIAGNOSIS — M25552 Pain in left hip: Secondary | ICD-10-CM

## 2018-12-14 DIAGNOSIS — M533 Sacrococcygeal disorders, not elsewhere classified: Secondary | ICD-10-CM

## 2018-12-14 NOTE — Patient Instructions (Signed)
Please call the office [phone]) to schedule a follow-up appt w/ Dr. Paulla Fore 2-3 days after your MRI

## 2018-12-14 NOTE — Progress Notes (Signed)
Lindsay Reyes. Kayvan Hoefling, [place] at [facility] [phone]  Lindsay Reyes - 68 y.o. female MRN [id number]  Date of birth: [birth year]  Visit Date:   PCP: [name], MD   Referred by: [name], MD   SUBJECTIVE:  Chief Complaint  Patient presents with  . Initial Assessment    Coccyx/sacral pain    HPI: Patient presents with 6 worsening coccygeal and left hip pain.  She has been seen by physiatry [place], pedis care doctor referred for second opinion.  She has had over 2 years of worsening pain and a removal of her coccyx has been recommended by her orthopedist.  Interestingly she also reports fairly substantial amount of urinary incontinence that is been present for quite some time.  This does cause significant day-to-day dysfunction.  The pain is directly over the coccyx and not relieved with any type of injection.,  Medication, home therapeutic exercises or aerobic activity.  It is worsened with sitting.  She has had an MRI of her pelvis and ordered by Dr. [name].  She is here today for further evaluation and other recommendations.  REVIEW OF SYSTEMS: She does have pain at night due to this as well as some associated left lateral hip pain and thigh pain.  She does have a history incontinence but no changes with bowel control.  No pain with defecation.  She is not sexually active.  Otherwise 12 point review of systems is negative.  HISTORY:  Prior history reviewed and updated per electronic medical record.  Social History   Occupational History  . Not on file  Tobacco Use  . Smoking status: Never Smoker  . Smokeless tobacco: Never Used  Substance and Sexual Activity  . Alcohol use: Yes    Comment: socially  . Drug use: No  . Sexual activity: Not on file   Social History   Social History Narrative  . Not on file      DATA OBTAINED & REVIEWED:  Recent Labs    09/29/18 1012 10/12/18 1056  HGBA1C 5.3  --   CALCIUM  10.0  --   AST 15  --   ALT 9  --   TSH 4.56* 5.31*   No problems updated. No specialty comments available.  OBJECTIVE:  VS:  HT:    WT:   BMI:     BP:   HR: bpm  TEMP: ( )  RESP:    PHYSICAL EXAM: Adult female.  No acute distress.  Alert and appropriate her bilateral lower extremities overall well aligned.  She does have a hard time sitting directly on her tailbone in a seated position and sits sideways on the table.  She has good internal and external rotation of her hips.  Her coccyx is focally tender directly over the inferior portion more so over the actual pelvic floor component as opposed over the sacral side.  She has marked pain over the left greater trochanter and gluteal musculature.   ASSESSMENT  1. Coccygodynia   2. Left hip pain     PLAN:  Pertinent additional documentation may be included in corresponding procedure notes, imaging studies, problem based documentation and patient instructions.  Procedures:  None  Medications:  No orders of the defined types were placed in this encounter.  Discussion/Instructions: No problem-specific Assessment & Plan notes found for this encounter. Referral placed Pelvic PT at [facility] urology.  She does have underlying urinary incontinence and likely has significant pelvic  floor dysfunction that is causing her coccygeal pain. Discussed appropriate use of both heat and ice with the patient today.  >50% of this 25 minutes minute visit spent in direct patient counseling and/or coordination of care. Discussion was focused on education regarding the in discussing the pathoetiology and anticipated clinical course of the above condition. I agree further diagnostic evaluation with MRI is warranted especially given the duration of this and lack of improvement with injections.  If any significant findings osseous irregularity would be persuaded more towards a surgical option but if normal findings continued conservative care will be  recommended.  Either way she will benefit from physical therapy If any lack of improvement: Referral back to Dr. Len Childs in [facility] for a coccygeal removal.   No follow-ups on file.          Gerda Diss, [facility] Physician

## 2018-12-14 NOTE — Addendum Note (Signed)
Addended by: Wendy Poet on: 12/14/2018 02:37 PM   Modules accepted: Orders

## 2018-12-17 ENCOUNTER — Other Ambulatory Visit: Payer: Self-pay | Admitting: Family Medicine

## 2018-12-30 ENCOUNTER — Telehealth: Payer: Self-pay | Admitting: Sports Medicine

## 2018-12-30 NOTE — Telephone Encounter (Signed)
See note  Copied from [place] [id number]. Topic: General - Inquiry >> Dec 30, 2018  4:25 PM Lindsay Reyes wrote: Reason for CRM: pt called to speak to [name] stating that [facility] has called and scheduled her for PT Monday 2:00pm but she won't have her MRI results yet. She is going for MRI Saturday and was told to follow up with Dr. Paulla Fore 2-3 days after. Pt asking if she should hold off on PT until after she sees [name] results. Please notify pt.

## 2018-12-31 ENCOUNTER — Ambulatory Visit
Admission: RE | Admit: 2018-12-31 | Discharge: 2018-12-31 | Disposition: A | Discharge status: Home / Self Care | Payer: Medicare HMO | Source: Ambulatory Visit | Attending: Family Medicine | Admitting: Family Medicine

## 2018-12-31 DIAGNOSIS — M25552 Pain in left hip: Secondary | ICD-10-CM

## 2018-12-31 DIAGNOSIS — M533 Sacrococcygeal disorders, not elsewhere classified: Secondary | ICD-10-CM

## 2018-12-31 MED ORDER — GADOBENATE DIMEGLUMINE 529 MG/ML IV SOLN
15.0000 mL | Freq: Once | INTRAVENOUS | Status: AC | PRN
  Administered 2018-12-31: 15 mL via INTRAVENOUS

## 2019-01-02 ENCOUNTER — Telehealth: Payer: Self-pay | Admitting: Family Medicine

## 2019-01-02 NOTE — Telephone Encounter (Signed)
Returned pt's call and informed her that Dr. Paulla Fore looked at her results and stated that it's fine for her to go for her PT evaluation this afternoon.  Pt asks if the treatment that she is supposed to get at PT will help her and take her pain away and I inform her that I can't answer that question since every pt responds differently to treatment.  I also tell the pt that Dr. Paulla Fore would like her to f/u in the office to better go over her MRI results and ask the pt if she would like to schedule an appt.  She states that she prefers to call back at a later time to schedule that appt.

## 2019-01-02 NOTE — Telephone Encounter (Signed)
Unsure who gave patient her MRI results today.  Dr. [name] has not reviewed the results.  Patient had the MRI on Saturday and Dr. [name] was not in the office today.  Cannot call patient with any results until Dr. [name] has reviewed the results.

## 2019-01-02 NOTE — Telephone Encounter (Signed)
Do not see any documentation in chart that anyone gave results to patient today.

## 2019-01-02 NOTE — Telephone Encounter (Signed)
Copied from [place] [phone]. Topic: General - Inquiry >> Jan 02, 2019  4:21 PM Vernona Rieger wrote: Reason for CRM:   Patient states she was given her MRI results this morning and she would like the nurse to call her back and let her know exactly what it was she had so she can write it down

## 2019-01-03 NOTE — Progress Notes (Signed)
Please inform patient of the following:  Her MRI shows evidence of chronic stress to her tailbone. She has no other abnormalities that would explain her pain.Would like for her to continue with the treatment plan as outline by Dr Paulla Fore and her orthopedist. Do not need to make any changes to her plan based on this.  Algis Greenhouse. [name], MD 01/03/2019 1:38 PM

## 2019-01-04 NOTE — Telephone Encounter (Signed)
Patient has received results. 

## 2019-01-05 ENCOUNTER — Encounter: Payer: Self-pay | Admitting: Sports Medicine

## 2019-01-05 ENCOUNTER — Other Ambulatory Visit: Payer: Self-pay | Admitting: Sports Medicine

## 2019-01-05 ENCOUNTER — Ambulatory Visit (INDEPENDENT_AMBULATORY_CARE_PROVIDER_SITE_OTHER): Payer: Medicare HMO | Admitting: Sports Medicine

## 2019-01-05 ENCOUNTER — Other Ambulatory Visit: Payer: Self-pay | Admitting: Physical Therapy

## 2019-01-05 VITALS — BP 110/82 | HR 89 | Ht 64.0 in

## 2019-01-05 DIAGNOSIS — M489 Spondylopathy, unspecified: Secondary | ICD-10-CM

## 2019-01-05 DIAGNOSIS — M25552 Pain in left hip: Secondary | ICD-10-CM

## 2019-01-05 DIAGNOSIS — M533 Sacrococcygeal disorders, not elsewhere classified: Secondary | ICD-10-CM

## 2019-01-05 DIAGNOSIS — M4698 Unspecified inflammatory spondylopathy, sacral and sacrococcygeal region: Secondary | ICD-10-CM

## 2019-01-05 MED ORDER — CALCITONIN (SALMON) 200 UNIT/ACT NA SOLN: 1.0000 | Freq: Every day | NASAL | 1 refills | Status: DC

## 2019-01-05 NOTE — Patient Instructions (Signed)
[facility] (PT): [phone]  If you need to change your PT location and need a new referral, please call us [phone] or send [place] a message via MyChart and we'll place a new referral for you.

## 2019-01-05 NOTE — Progress Notes (Signed)
Lindsay Reyes. Abdiaziz Klahn, [place] at [facility] [phone]  Aury Reyes - 68 y.o. female MRN [id number]  Date of birth: [birth year]  Visit Date: January 05, 2019  PCP: [name], MD   Referred by: [name], MD  SUBJECTIVE:  Chief Complaint  Patient presents with  . Follow-up    Coccyx pain.  Pelvis MRI - 12/31/18.  PT referral placed to [facility] but pt has not gone due to price.    HPI: Patient is here for follow-up and to review the MRI results of her pelvic MRI.  She continues to have pain and discomfort.  And has not had any significant relief with the anti-inflammatory medication.  She was referred to [facility] for pelvic floor PT however due to the co-pay and this being out of network she was referred to a alternative therapist that does not appear to be a pelvic floor PT specialist.  She otherwise reports no significant changes in her symptoms and continues to have nighttime disturbances as well as urinary incontinence that is chronic and unchanged.  REVIEW OF SYSTEMS: Per HPI    HISTORY:  Prior history reviewed and updated per electronic medical record.  Social History   Occupational History  . Not on file  Tobacco Use  . Smoking status: Never Smoker  . Smokeless tobacco: Never Used  Substance and Sexual Activity  . Alcohol use: Yes    Comment: socially  . Drug use: No  . Sexual activity: Not on file   Social History   Social History Narrative  . Not on file    OBJECTIVE:  VS:  HT:5\' 4"  (162.6 cm)   WT:(Pt refused to be weighed)  BMI:     BP:110/82  HR:89bpm  TEMP: ( )  RESP:96 %   PHYSICAL EXAM: Adult female.  No acute distress.  Alert appropriate.  She has difficult time sitting directly over the sacrum.  Pain over the lower coccygeal region.  Her MRI results were reviewed that do show significant bony edema within the lower coccygeal segments.  She has infiltrative red marrow  increased signal which is nonspecific in nature.  Significant multilevel lower lumbar degenerative change without significant neural impingement.   ASSESSMENT   1. Coccygodynia   2. Left hip pain   3. Coccygeal arthritis       PROCEDURES:      PLAN:  Pertinent additional documentation may be included in corresponding procedure notes, imaging studies, problem based documentation and patient instructions.  Long discussion today regarding the underlying etiology of her pain.  She does have significant bony edema within the lower coccygeal segments concerning for chronic stress related bony injury.  She does not recall any significant direct trauma to this area and this is likely reflective more so of a chronic stress fracture type mechanism.  Emphasized the importance of physical therapy with a complete and thorough pelvic floor exam by specialty physical therapy.  We did discuss that breakthrough physical therapy may be an alternative that may be in network for her otherwise she does need to be seen by [facility] or [facility] for pelvic floor specialists.  We did discuss using nasal calcitonin as a possible potential for helping with her underlying bone pain extrapolating data from pression fractures.  We will see how she responds to this and discussed the low risk.   Extensive review with the patient in about how to discuss this with her  physical therapy providers about internal exam and internal manipulation standpoint. Activity modifications and the importance of avoiding exacerbating activities (limiting pain to no more than a 4 / 10 during or following activity) recommended and discussed.   Discussed red flag symptoms that warrant earlier emergent evaluation and patient voices understanding.    Meds ordered this encounter  Medications  . calcitonin, salmon, (MIACALCIN) 200 UNIT/ACT nasal spray    Sig: Place 1 spray into alternate nostrils daily.     Dispense:  3.7 mL    Refill:  1   Lab Orders  No laboratory test(s) ordered today   Imaging Orders  No imaging studies ordered today   Referral Orders  No referral(s) requested today      Return in about 4 weeks (around 02/02/2019).          Gerda Diss, [place] [facility] Physician

## 2019-01-20 ENCOUNTER — Telehealth: Payer: Self-pay | Admitting: Family Medicine

## 2019-01-20 ENCOUNTER — Other Ambulatory Visit: Payer: Self-pay

## 2019-01-20 MED ORDER — PANTOPRAZOLE SODIUM 40 MG PO TBEC: 40.0000 mg | DELAYED_RELEASE_TABLET | Freq: Every day | ORAL | 3 refills | Status: DC

## 2019-01-20 NOTE — Telephone Encounter (Signed)
Called pt - previous refilled pantoprazole, pt just ran out. She had been taking it prn instead of scheduled. Pt with GERD stating she is out of medication and is having symptoms of heartburn and reflux. Pt asking for refill. Last refill was 12/31/17.

## 2019-01-20 NOTE — Telephone Encounter (Signed)
Rx sent to pharmacy   

## 2019-01-20 NOTE — Telephone Encounter (Signed)
See note

## 2019-01-20 NOTE — Telephone Encounter (Signed)
Copied from [place] [id number]. Topic: Quick Communication - Rx Refill/Question >> Jan 20, 2019  3:19 PM Reyne Dumas L wrote: Medication:  Pantoprazole 40mg   Has the patient contacted their pharmacy? Yes - states no refills left (Agent: If no, request that the patient contact the pharmacy for the refill.) (Agent: If yes, when and what did the pharmacy advise?)  Preferred Pharmacy (with phone number or street name): [id number] [place], [place] - [address] [phone] (Phone) [phone] (Fax)  Agent: Please be advised that RX refills may take up to 3 business days. We ask that you follow-up with your pharmacy.

## 2019-02-02 ENCOUNTER — Ambulatory Visit (INDEPENDENT_AMBULATORY_CARE_PROVIDER_SITE_OTHER): Payer: Medicare HMO | Admitting: Sports Medicine

## 2019-02-02 ENCOUNTER — Encounter: Payer: Self-pay | Admitting: Sports Medicine

## 2019-02-02 VITALS — BP 142/96 | HR 80 | Ht 64.0 in

## 2019-02-02 DIAGNOSIS — M25552 Pain in left hip: Secondary | ICD-10-CM | POA: Diagnosis not present

## 2019-02-02 DIAGNOSIS — M533 Sacrococcygeal disorders, not elsewhere classified: Secondary | ICD-10-CM | POA: Diagnosis not present

## 2019-02-02 DIAGNOSIS — M9905 Segmental and somatic dysfunction of pelvic region: Secondary | ICD-10-CM

## 2019-02-02 DIAGNOSIS — M489 Spondylopathy, unspecified: Secondary | ICD-10-CM | POA: Diagnosis not present

## 2019-02-02 DIAGNOSIS — M4698 Unspecified inflammatory spondylopathy, sacral and sacrococcygeal region: Secondary | ICD-10-CM

## 2019-02-02 NOTE — Progress Notes (Signed)
Lindsay Reyes. Branch Pacitti, [place] at [facility] [phone]  Lindsay Reyes - 67 y.o. female MRN [id number]  Date of birth: [birth year]  Visit Date: February 05, 2019  PCP: [name], MD   Referred by: [name], MD  SUBJECTIVE:  Chief Complaint  Patient presents with  . coccygodynia    MRI pelvis 12/31/2018. Referred to PT for Pelvic Floor PT, has been x 2 visits. No OTC meds, heat or ice. Worse w/ sitting.     HPI: Patient reports mild improvement in her symptoms.  She has been using the nasal calcitonin as well as working with pelvic floor PT.  She has been to 2 sessions.  She has not noticed a significant difference with this.  She has noticed some localization to the right posterior aspect of her sacral and pelvic region.  Some mild back pain.  No changes in urinary symptoms previously discussed.  REVIEW OF SYSTEMS: No significant nighttime awakenings due to this issue. Denies fevers, chills, recent weight gain or weight loss.  No night sweats.  Pt denies any change in bowel or bladder habits, muscle weakness, numbness or falls associated with this pain.  HISTORY:  Prior history reviewed and updated per electronic medical record.  Patient Active Problem List   Diagnosis Date Noted  . Chronic insomnia 06/30/2018  . 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  . Osteopenia 05/03/2018  . Allergic rhinitis 04/01/2018  . Insomnia 03/01/2018    Has tried Lunesta, Ativan, Ambien, trazodone, and belsomra in the past without significant relief.   . 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 Urinary incontinence 01/18/2018  . LOW BACK PAIN, CHRONIC 12/12/2007    Qualifier: Diagnosis of  By: Council Mechanic MD, [place], COLON W/O HEM 08/25/2007    Qualifier: Diagnosis of  By: Council Mechanic MD, Hilaria Ota    . GERD 04/15/2007    Qualifier: Diagnosis of  By: Council Mechanic MD, Hilaria Ota     Social History   Occupational History  . Not on file    Tobacco Use  . Smoking status: Never Smoker  . Smokeless tobacco: Never Used  Substance and Sexual Activity  . Alcohol use: Yes    Comment: socially  . Drug use: No  . Sexual activity: Not on file   Social History   Social History Narrative  . Not on file    OBJECTIVE:  VS:  HT:5\' 4"  (162.6 cm)   WT:(Pt declined)  BMI:     BP:(!) 142/96  HR:80bpm  TEMP: ( )  RESP:97 %   PHYSICAL EXAM: Adult female. No acute distress.  Alert and appropriate. Right anterior rotation of her pelvis with right greater than left SI joint tenderness.  She has poor pelvic control.  Markedly tight right hip flexors.   ASSESSMENT:   1. Coccygodynia   2. Left hip pain   3. Coccygeal arthritis   4. Somatic dysfunction of pelvis region     PROCEDURES:  PROCEDURE NOTE: OSTEOPATHIC MANIPULATION  The decision today to treat with Osteopathic Manipulative Therapy (OMT) was based on physical exam findings. Verbal consent was obtained following a discussion with the patient regarding the of risks, benefits and potential side effects, including an acute pain flare,post manipulation soreness and need for repeat treatments.   Contraindications to OMT: NONE Manipulation was performed as below: [facility]ions Treated & Osteopathic Exam Findings PELVIS:  Right psoas spasm Right anterior innonimate  OMT Techniques Used: muscle energy myofascial release HVLA -  Long Lever  The patient tolerated the treatment well and reported Improved symptoms following treatment today. Patient was given medications, exercises, stretches and lifestyle modifications per AVS and verbally.      PLAN:  Pertinent additional documentation may be included in corresponding procedure notes, imaging studies, problem based documentation and patient instructions.  No problem-specific Assessment & Plan notes found for this encounter.   She is showing moderate improvement in her symptoms.  Pelvic PT did show significant improvement in her  symptoms although was only very short-lived until she returned to sitting in her car.  She reports least 5 minutes of almost complete resolution in the discomfort that she was having following her PT which is reassuring that this is likely a large component of functional pelvic floor issues.  Continue working with physical therapy  Osteopathic manipulation was performed today based on physical exam findings.  Patient was counseled on the purpose and expected outcome of osteopathic manipulation and understands that a single treatment may not provide permanent long lasting relief.  They understand that home therapeutic exercises are critical part of the healing/treatment process and will continue with self treatment between now and their next visit as outlined.  The patient understands that the frequency of visits is meant to provide a stimulus to promote the body's own ability to heal and is not meant to be the sole means for improvement in their symptoms.  Activity modifications and the importance of avoiding exacerbating activities (limiting pain to no more than a 4 / 10 during or following activity) recommended and discussed.  Discussed red flag symptoms that warrant earlier emergent evaluation and patient voices understanding.   No orders of the defined types were placed in this encounter.  Lab Orders  No laboratory test(s) ordered today   Imaging Orders  No imaging studies ordered today   Referral Orders  No referral(s) requested today    At follow up will plan to consider: repeat osteopathic manipulation  Return in about 6 weeks (around 03/16/2019).          Gerda Diss, [place] [facility] Physician

## 2019-02-05 ENCOUNTER — Encounter: Payer: Self-pay | Admitting: Sports Medicine

## 2019-02-16 ENCOUNTER — Ambulatory Visit (INDEPENDENT_AMBULATORY_CARE_PROVIDER_SITE_OTHER): Payer: Medicare HMO | Admitting: Family Medicine

## 2019-02-16 ENCOUNTER — Other Ambulatory Visit: Payer: Self-pay

## 2019-02-16 ENCOUNTER — Encounter: Payer: Self-pay | Admitting: Family Medicine

## 2019-02-16 VITALS — BP 134/82 | HR 76 | Temp 97.7°F | Ht 64.0 in | Wt 161.0 lb

## 2019-02-16 DIAGNOSIS — G4700 Insomnia, unspecified: Secondary | ICD-10-CM

## 2019-02-16 DIAGNOSIS — G4730 Sleep apnea, unspecified: Secondary | ICD-10-CM | POA: Diagnosis not present

## 2019-02-16 DIAGNOSIS — J3489 Other specified disorders of nose and nasal sinuses: Secondary | ICD-10-CM | POA: Diagnosis not present

## 2019-02-16 MED ORDER — AZELASTINE HCL 0.1 % NA SOLN: 2.0000 | Freq: Two times a day (BID) | NASAL | 12 refills | Status: DC

## 2019-02-16 MED ORDER — QUETIAPINE FUMARATE 100 MG PO TABS: 300.0000 mg | ORAL_TABLET | Freq: Every day | ORAL | 3 refills | Status: DC

## 2019-02-16 NOTE — Progress Notes (Signed)
   Chief Complaint:  Lindsay Reyes is a 68 y.o. female who presents today with a chief complaint of insomnia.   Assessment/Plan:   Sleep-disordered breathing We will send an order for CPAP with auto titration.  Rhinorrhea Start Astelin nasal spray.  Insomnia Will increase dose of Seroquel 200 mg nightly for 2 to 3 days, then increase to 200 mg after 2 to 3 days, then increase to 300 mg nightly.  Discussed potential side effects.  Follow-up with me in 3 to 6 months.     Subjective:  HPI:  Insomnia Chronic problem.  Seems to be worsening.  Currently takes Seroquel 50 mg at night along with sleepy time tea and melatonin.  Symptoms are not well controlled.  Still has several sleepless nights.  Occasionally while asleep 1 to 2 hours at a time.  Sleep disordered breathing Chronic problem.  She has also had several episodes of waking up at night coughing and choking.  Has had a few episodes of being short of breath as well.  The symptoms occur nightly.  She feels very somnolent during the day with low energy levels.  Occasionally dozes off during the day as well.  She is interested in possible having a CPAP machine to treat any possible underlying OSA.  Rhinorrhea Chronic problem.  Seems to be worsening.  Has tried over-the-counter nasal sprays with no improvement.  Symptoms seem to be worse at night.   ROS: Per HPI  PMH: She reports that she has never smoked. She has never used smokeless tobacco. She reports current alcohol use. She reports that she does not use drugs.      Objective:  Physical Exam: BP 134/82 (BP Location: Left Arm, Patient Position: Sitting, Cuff Size: Normal)   Pulse 76   Temp 97.7 F (36.5 C) (Oral)   Ht 5\' 4"  (1.626 m)   Wt 161 lb (73 kg)   SpO2 97%   BMI 27.64 kg/m   Gen: NAD, resting comfortably HEENT: Nasal mucosa erythematous and boggy with clear discharge. CV: Regular rate and rhythm with no murmurs appreciated Pulm: Normal work of breathing, clear  to auscultation bilaterally with no crackles, wheezes, or rhonchi     Caleb M. [name], MD 02/16/2019 2:02 PM

## 2019-02-16 NOTE — Patient Instructions (Signed)
It was very nice to see you today!  Please increase your Seroquel to 100 mg nightly.  Do this for a few days and then increase to 200 mg nightly.  You can then increase to 300 mg daily a few days after that.  Please try the nasal spray at night.  We will get you an order for your CPAP machine.  Please let us know if your symptoms do not improve over the next few weeks.   Take care, Dr [name]

## 2019-02-16 NOTE — Assessment & Plan Note (Signed)
We will send an order for CPAP with auto titration.

## 2019-02-16 NOTE — Assessment & Plan Note (Signed)
Will increase dose of Seroquel 200 mg nightly for 2 to 3 days, then increase to 200 mg after 2 to 3 days, then increase to 300 mg nightly.  Discussed potential side effects.  Follow-up with me in 3 to 6 months.

## 2019-02-16 NOTE — Assessment & Plan Note (Signed)
Start Astelin nasal spray.   

## 2019-03-07 ENCOUNTER — Other Ambulatory Visit: Payer: Self-pay

## 2019-03-07 MED ORDER — CALCITONIN (SALMON) 200 UNIT/ACT NA SOLN: 1.0000 | Freq: Every day | NASAL | 1 refills | Status: DC

## 2019-03-13 ENCOUNTER — Ambulatory Visit: Payer: Medicare HMO | Admitting: Sports Medicine

## 2019-03-15 ENCOUNTER — Ambulatory Visit: Payer: Self-pay | Admitting: *Deleted

## 2019-03-15 NOTE — Telephone Encounter (Signed)
This encounter was created in error - please disregard.

## 2019-03-15 NOTE — Telephone Encounter (Signed)
Noted  

## 2019-03-15 NOTE — Telephone Encounter (Signed)
See note

## 2019-03-15 NOTE — Telephone Encounter (Signed)
Pt left message on COVID voicemail 03/15/2019 at 0946 wanting to know if there are any symptoms other than what is being reported on the TV, particularly anything related to the stomach; she said that for the past month she "has been going through hell"; contacted pt regarding her concerns; she says that for the past month she gets sick on her stomach (nausea) and diarrhea for the past month; recommendations made per nurse traige protocol; the pt says that she does not have anything to take her temperature; explained to pt that testing is not being done; further explained to pt that if she has other concerns related to her stomach issue, Dr [name] would decide if she should be seen in the office vs a virtual visit; she says that she can not do anything virtual; she can be contacted at [phone] pt normally sees Dr Hetty Blend Parker,[facility] [place]; will route to office for notification of this encounter.  Reason for Disposition . 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

## 2019-03-17 ENCOUNTER — Other Ambulatory Visit: Payer: Self-pay | Admitting: Family Medicine

## 2019-03-22 ENCOUNTER — Telehealth: Payer: Self-pay | Admitting: Family Medicine

## 2019-03-22 NOTE — Telephone Encounter (Signed)
Notified Barbaraann Rondo via voicemail that he needs to call [facility] she was seen in August.Call with any questions.

## 2019-03-22 NOTE — Telephone Encounter (Signed)
See note

## 2019-03-22 NOTE — Telephone Encounter (Addendum)
Copied from [place] [id number]. Topic: Quick Communication - See Telephone Encounter >> Mar 22, 2019 10:41 AM Ivar Drape wrote: CRM for notification. See Telephone encounter for: 03/22/19. Barbaraann Rondo a referral specialist w/[facility] [phone] fax: [phone] stated he needs the follow:  1) patient's C-pap Therapy prescription needs the pressure setting 2) a copy of the patient's Sleep Study 3) an office visit note prior to that sleep study.

## 2019-03-24 ENCOUNTER — Telehealth: Payer: Self-pay | Admitting: Family Medicine

## 2019-03-24 NOTE — Telephone Encounter (Unsigned)
Copied from [place] [id number]. Topic: Quick Communication - Rx Refill/Question >> Mar 24, 2019  2:50 PM Celene Kras A wrote: Medication: trazidone instead of seroquol  Has the patient contacted their pharmacy? Yes.  Pt states she has been taking seroquol and it is not helping and is a waste of money and she would like to take Dr. Ellwood Handler recommendation and start on Trazidone. Please advise. (Agent: If no, request that the patient contact the pharmacy for the refill.) (Agent: If yes, when and what did the pharmacy advise?)  Preferred Pharmacy (with phone number or street name): [id number] [place], [place] [address] [place] [address] Phone: [phone] Fax: [phone] Not a 24 hour pharmacy; exact hours not known.    Agent: Please be advised that RX refills may take up to 3 business days. We ask that you follow-up with your pharmacy.

## 2019-03-24 NOTE — Telephone Encounter (Signed)
See note

## 2019-03-27 ENCOUNTER — Encounter: Payer: Self-pay | Admitting: Family Medicine

## 2019-03-27 ENCOUNTER — Ambulatory Visit (INDEPENDENT_AMBULATORY_CARE_PROVIDER_SITE_OTHER): Payer: Medicare HMO | Admitting: Family Medicine

## 2019-03-27 DIAGNOSIS — G4700 Insomnia, unspecified: Secondary | ICD-10-CM

## 2019-03-27 MED ORDER — TRAZODONE HCL 50 MG PO TABS: 50.0000 mg | ORAL_TABLET | Freq: Every evening | ORAL | 3 refills | Status: DC | PRN

## 2019-03-27 NOTE — Progress Notes (Signed)
    Chief Complaint:  Karris Deangelo is a 68 y.o. female who presents for a telephone visit with a chief complaint of insomnia.   Assessment/Plan:   Insomnia Stop Seroquel has that has not been effective.  Will start nighttime trazodone.  We will also place referral for sleep medicine.     Subjective:  HPI:  Insomnia Chronic problem.  Has tried several medications in the past.  Is currently on Seroquel however does not feel like it is work anymore.  She is interested in switching over to trazodone.  She has additionally been evaluated for sleep apnea in the past.  She is currently waiting to hear back from her home health agency [facility] about getting a CPAP machine.  She has not completed the sleep study for several years.  ROS: Per HPI  PMH: She reports that she has never smoked. She has never used smokeless tobacco. She reports current alcohol use. She reports that she does not use drugs.      Objective/Observations   N/A  Telephone Visit   I connected with [name] on 03/27/19 at  4:00 PM EDT via telephone and verified that I am speaking with the correct person using two identifiers. I discussed the limitations of evaluation and management by telemedicine and the availability of in person appointments. The patient expressed understanding and agreed to proceed.   Patient location: Home Provider location: [place] participating in the virtual visit: Myself and Patient  A total of 15 minutes were spent on medical discussion.      Algis Greenhouse. [name], MD 03/27/2019 4:18 PM

## 2019-03-27 NOTE — Telephone Encounter (Signed)
Please advise 

## 2019-03-27 NOTE — Telephone Encounter (Signed)
Please see below.  Thank you.

## 2019-03-27 NOTE — Assessment & Plan Note (Signed)
Stop Seroquel has that has not been effective.  Will start nighttime trazodone.  We will also place referral for sleep medicine.

## 2019-03-27 NOTE — Telephone Encounter (Signed)
Please schedule for video appointment to discuss options.  Algis Greenhouse. [name], MD 03/27/2019 10:00 AM

## 2019-03-30 ENCOUNTER — Telehealth: Payer: Self-pay | Admitting: Family Medicine

## 2019-03-30 NOTE — Telephone Encounter (Signed)
Copied from [place] [id number]. Topic: General - Other >> Mar 30, 2019 11:49 AM Jodie Echevaria wrote: Reason for CRM: Patient called to say that [facility] is requesting Rx with pressure setting for CPAP, Demographics, Sleep study, Last office notes Face to face and previous office visit notes. She asked for these to be faxed to [facility] at fax# [phone]

## 2019-03-30 NOTE — Telephone Encounter (Signed)
Patient has been referred to sleep medicine.  She was informed at her last visit that we do not have sleep study information for her and her last sleep study was too long ago to be valid.

## 2019-05-16 ENCOUNTER — Other Ambulatory Visit: Payer: Self-pay | Admitting: Family Medicine

## 2019-06-08 ENCOUNTER — Ambulatory Visit (INDEPENDENT_AMBULATORY_CARE_PROVIDER_SITE_OTHER): Payer: Medicare HMO | Admitting: Family Medicine

## 2019-06-08 ENCOUNTER — Other Ambulatory Visit: Payer: Self-pay

## 2019-06-08 ENCOUNTER — Encounter: Payer: Self-pay | Admitting: Family Medicine

## 2019-06-08 VITALS — BP 128/82 | HR 74 | Temp 97.6°F | Ht 64.0 in

## 2019-06-08 DIAGNOSIS — H6983 Other specified disorders of Eustachian tube, bilateral: Secondary | ICD-10-CM

## 2019-06-08 DIAGNOSIS — J309 Allergic rhinitis, unspecified: Secondary | ICD-10-CM

## 2019-06-08 NOTE — Patient Instructions (Signed)
It was very nice to see you today!  He has a small amount of fluid buildup behind your eardrums.  This is probably related to your allergies.  Please start taking a Claritin or Zyrtec to see if this helps.  You can also use Afrin for a couple days if needed.  Take care, Dr [name]

## 2019-06-08 NOTE — Progress Notes (Signed)
   Chief Complaint:  Lindsay Reyes is a 68 y.o. female who presents for same day appointment with a chief complaint of ear pain.   Assessment/Plan:  Eustachian tube dysfunction/allergic rhinitis No signs of ear infection.  Her pain and tenderness likely secondary to eustachian tube dysfunction.  Recommended starting over-the-counter allergy medication such as Claritin or Zyrtec.  Offered prescription for Singulair however patient declined.  Discussed reasons to return to care.  Follow-up as needed.     Subjective:  HPI:  Ear Pain, acute problem Started several months ago.  Intermittent in nature.  Typically located in right ear, can also be in the left ear.  Associated with some tinnitus.  She has history of allergic rhinitis and has been on several medications for this in the past including Astelin and Flonase which did not help.  Symptoms occur randomly last for a period time and then improved spontaneously.  No drainage. No other obvious alleviating or aggravating factors.    ROS: Per HPI  PMH: She reports that she has never smoked. She has never used smokeless tobacco. She reports current alcohol use. She reports that she does not use drugs.      Objective:  Physical Exam: BP 128/82 (BP Location: Left Arm, Patient Position: Sitting, Cuff Size: Normal)   Pulse 74   Temp 97.6 F (36.4 C) (Oral)   Ht 5\' 4"  (1.626 m)   SpO2 96%   BMI 27.64 kg/m   Gen: NAD, resting comfortably HEENT: Bilateral TMs with clear effusion.  Bony exostosis noted bilaterally as well. CV: Regular rate and rhythm with no murmurs appreciated Pulm: Normal work of breathing, clear to auscultation bilaterally with no crackles, wheezes, or rhonchi      Caleb M. [name], MD 06/08/2019 12:19 PM

## 2019-07-18 ENCOUNTER — Encounter: Payer: Self-pay | Admitting: Family Medicine

## 2019-07-18 ENCOUNTER — Encounter

## 2019-07-18 ENCOUNTER — Ambulatory Visit (INDEPENDENT_AMBULATORY_CARE_PROVIDER_SITE_OTHER): Payer: Medicare HMO | Admitting: Family Medicine

## 2019-07-18 ENCOUNTER — Telehealth: Payer: Self-pay | Admitting: Family Medicine

## 2019-07-18 DIAGNOSIS — G4700 Insomnia, unspecified: Secondary | ICD-10-CM | POA: Diagnosis not present

## 2019-07-18 MED ORDER — QUETIAPINE FUMARATE 25 MG PO TABS: 50.0000 mg | ORAL_TABLET | Freq: Every day | ORAL | 3 refills | Status: DC

## 2019-07-18 MED ORDER — TRIAMCINOLONE ACETONIDE 0.5 % EX OINT: 1.0000 "application " | TOPICAL_OINTMENT | Freq: Two times a day (BID) | CUTANEOUS | 0 refills | Status: DC

## 2019-07-18 NOTE — Telephone Encounter (Signed)
Please advise 

## 2019-07-18 NOTE — Assessment & Plan Note (Signed)
Continue Seroquel 50mg  nightly.  She will continue taking Ambien.  Database was reviewed without red flags.  I will refill as needed.

## 2019-07-18 NOTE — Telephone Encounter (Signed)
Patient was seen today and states zolpidem (AMBIEN) 5 MG tablet was suppose to be sent to the pharmacy today, please advise  [id number] [address], [place] [phone] (Phone) [phone] (Fax)

## 2019-07-18 NOTE — Progress Notes (Signed)
   Chief Complaint:  Lindsay Reyes is a 68 y.o. female who presents for a telephone visit with a chief complaint of insomnia.   Assessment/Plan:  Insomnia Continue Seroquel 50mg  nightly.  She will continue taking Ambien.  Database was reviewed without red flags.  I will refill as needed.  Insect Bite No red flags.  Start topical triamcinolone.    Subjective:  HPI:  Insomnia Chronic problem.  Currently on Seroquel 50 mg daily.  She did not tolerate trazodone.  Recently took leftover Ambien and had significant improvement in her symptoms.  She is now taking Seroquel 50 mg and Ambien 5 mg nightly.  Thinks that this commendation works well for her.  No reported side effects.  Rash She has also had a spot on the left side of her neck.  Thinks that she was bit by a spider.  She has tried over-the-counter hydrocortisone cream with also.  Would like to try something stronger.  Also has a spot on her neck.   ROS: Per HPI  PMH: She reports that she has never smoked. She has never used smokeless tobacco. She reports current alcohol use. She reports that she does not use drugs.      Objective/Observations   NAD, speaking in full sentences  Telephone Visit   I connected with Lindsay Reyes on 07/18/19 at  3:40 PM EDT via telephone and verified that I am speaking with the correct person using two identifiers. I discussed the limitations of evaluation and management by telemedicine and the availability of in person appointments. The patient expressed understanding and agreed to proceed.   Patient location: Home Provider location: [place] participating in the virtual visit: Myself and Patient  A total of 13 minutes were spent on medical discussion.      Algis Greenhouse. [name], MD 07/18/2019 11:29 AM

## 2019-07-19 MED ORDER — ZOLPIDEM TARTRATE 5 MG PO TABS: 5.0000 mg | ORAL_TABLET | Freq: Every evening | ORAL | 5 refills | Status: DC | PRN

## 2019-07-19 NOTE — Telephone Encounter (Signed)
Rx sent in. Misunderstanding from yesterday's encounter, thought the patient had several weeks left and would call us when she needed a refill.  Algis Greenhouse. [name], MD 07/19/2019 7:58 AM

## 2019-07-21 ENCOUNTER — Ambulatory Visit: Payer: Medicare HMO | Admitting: Family Medicine

## 2019-07-21 ENCOUNTER — Encounter

## 2019-08-01 ENCOUNTER — Telehealth: Payer: Self-pay | Admitting: Family Medicine

## 2019-08-01 NOTE — Telephone Encounter (Signed)
Call placed to pt. To ask about her pain.  C/o bilateral buttock pain that radiates down post. thighs to knees. Rated pain at 10/10.   Denied any numbness or tingling of lower extremities.  Is not aware of what contributed to her pain.  Has been doing exercises for inflamed tailbone. Pt. Stated she is having trouble finding position of comfort with the pain in buttocks and in tailbone.  Has been taking Ibuprofen for pain with only short term relief.  Is requesting Rx for pain, to get by, until her appt. 9/3.    Called office; spoke with Watt Climes.  Offered that pt. Can schedule a virtual visit to be evaluated sooner than the office appt. On 9/3.  Stated that if pt. Is opposed to a virtual visit, to send note through for Dr. [name] to review and make recommendations.    Pt. Stated she doesn't know how to do a Virtual visit, and said "I don't even know why I need one; I just need something for the pain; I never ask him for anything."  Advised will send note to Dr. [name], and his nurse will call back within 24 hrs.  Pt. Verb. Understanding and agreed.

## 2019-08-01 NOTE — Telephone Encounter (Signed)
Pt stated she has an appt with Dr. [name] on 08/10/19 but is having pain. She would like to know if Dr. [name] can send something to her pharmacy for pain to get her through to her appt.  [id number] [address], [place] [phone] (Phone) [phone] (Fax)

## 2019-08-02 MED ORDER — DICLOFENAC SODIUM 75 MG PO TBEC: 75.0000 mg | DELAYED_RELEASE_TABLET | Freq: Two times a day (BID) | ORAL | 0 refills | Status: DC

## 2019-08-02 NOTE — Telephone Encounter (Signed)
Patient notified voices understanding 

## 2019-08-02 NOTE — Addendum Note (Signed)
Addended by: [name] on: 08/02/2019 08:10 AM   Modules accepted: Orders

## 2019-08-10 ENCOUNTER — Ambulatory Visit (INDEPENDENT_AMBULATORY_CARE_PROVIDER_SITE_OTHER): Payer: Medicare HMO | Admitting: Family Medicine

## 2019-08-10 ENCOUNTER — Other Ambulatory Visit: Payer: Self-pay

## 2019-08-10 ENCOUNTER — Telehealth: Payer: Self-pay | Admitting: Radiology

## 2019-08-10 ENCOUNTER — Other Ambulatory Visit (INDEPENDENT_AMBULATORY_CARE_PROVIDER_SITE_OTHER): Payer: Medicare HMO

## 2019-08-10 DIAGNOSIS — R7989 Other specified abnormal findings of blood chemistry: Secondary | ICD-10-CM

## 2019-08-10 DIAGNOSIS — R102 Pelvic and perineal pain: Secondary | ICD-10-CM | POA: Insufficient documentation

## 2019-08-10 DIAGNOSIS — M5430 Sciatica, unspecified side: Secondary | ICD-10-CM

## 2019-08-10 LAB — TSH: TSH: 2.38 u[IU]/mL (ref 0.35–4.50)

## 2019-08-10 LAB — T4, FREE: Free T4: 0.9 ng/dL (ref 0.60–1.60)

## 2019-08-10 NOTE — Assessment & Plan Note (Signed)
No red flags.  MRI from earlier this year did not show any concerning lesions.  Concern for possible bladder spasm versus cystitis.  Offered referral to urology however patient declined. Discussed reasons to return to care.

## 2019-08-10 NOTE — Progress Notes (Signed)
   Chief Complaint:  Lindsay Reyes is a 68 y.o. female who presents for a telephone visit with a chief complaint of sciatica.   Assessment/Plan:  Sciatica Resolved.  No red flags.  Continue with watchful waiting.  Elevated TSH/difficulty with weight loss Patient will come in for TSH and free T4.  Pelvic pain No red flags.  MRI from earlier this year did not show any concerning lesions.  Concern for possible bladder spasm versus cystitis.  Offered referral to urology however patient declined. Discussed reasons to return to care.      Subjective:  HPI:  Sciatica Started about a week ago. Located in bilateral buttocks and radiated into her thighs. No obvious precipitating events. No trauma. She took a few doses of diclofenac and her symptoms resolved.  She also has had intermittent vaginal pain. Pain is very severe. Occurs randomly.  She has had some increased frequency and urgency.  No dysuria.  Has been going on for several years.  She is also had some difficulty with weight loss.  ROS: Per HPI  PMH: She reports that she has never smoked. She has never used smokeless tobacco. She reports current alcohol use. She reports that she does not use drugs.      Objective/Observations   No results found for this or any previous visit (from the past 72 hour(s)).   Telephone Visit   I connected with Lindsay Reyes on 08/10/19 at  2:40 PM EDT via telephone and verified that I am speaking with the correct person using two identifiers. I discussed the limitations of evaluation and management by telemedicine and the availability of in person appointments. The patient expressed understanding and agreed to proceed.   Patient location: Home Provider location: [place] participating in the virtual visit: Myself and Patient   A total of 12 minutes were spent on medical discussion.      Lindsay Reyes. [name], MD 08/10/2019 11:28 AM

## 2019-08-10 NOTE — Progress Notes (Signed)
Please inform patient of the following:  Thyroid levels are NORMAL. Do not think this is causing her issues with weight. It is possible weight issues could be medication related, but I am hesitant to make many changes right now, especially since we just started her sleep medication.  Would like for her to really focus on diet and exercise and let us know if she is still having issues and we can refer her to see a nutritionist.  Lindsay Reyes. [name], MD 08/10/2019 4:30 PM

## 2019-08-10 NOTE — Telephone Encounter (Signed)
Noted  

## 2019-09-12 ENCOUNTER — Other Ambulatory Visit: Payer: Self-pay

## 2019-09-12 MED ORDER — PANTOPRAZOLE SODIUM 40 MG PO TBEC: 40.0000 mg | DELAYED_RELEASE_TABLET | Freq: Every day | ORAL | 6 refills | Status: DC

## 2019-09-27 DIAGNOSIS — Z0100 Encounter for examination of eyes and vision without abnormal findings: Secondary | ICD-10-CM | POA: Diagnosis not present

## 2019-09-27 DIAGNOSIS — H5213 Myopia, bilateral: Secondary | ICD-10-CM | POA: Diagnosis not present

## 2019-10-05 ENCOUNTER — Ambulatory Visit: Payer: Medicare HMO | Admitting: Family Medicine

## 2019-10-05 ENCOUNTER — Other Ambulatory Visit: Payer: Self-pay

## 2019-10-05 ENCOUNTER — Ambulatory Visit (INDEPENDENT_AMBULATORY_CARE_PROVIDER_SITE_OTHER): Payer: Medicare HMO | Admitting: Family Medicine

## 2019-10-05 ENCOUNTER — Encounter: Payer: Self-pay | Admitting: Family Medicine

## 2019-10-05 VITALS — BP 124/78 | HR 88 | Temp 98.0°F | Ht 64.0 in

## 2019-10-05 DIAGNOSIS — K219 Gastro-esophageal reflux disease without esophagitis: Secondary | ICD-10-CM | POA: Diagnosis not present

## 2019-10-05 DIAGNOSIS — M7918 Myalgia, other site: Secondary | ICD-10-CM | POA: Diagnosis not present

## 2019-10-05 NOTE — Patient Instructions (Signed)
It was very nice to see you today!  Please use the diclofenac as needed.  I will place a referral for you to see the gastroenterologist.  Take care, Dr [name]  Please try these tips to maintain a healthy lifestyle:   Eat at least 3 REAL meals and 1-2 snacks per day.  Aim for no more than 5 hours between eating.  If you eat breakfast, please do so within one hour of getting up.    Obtain twice as many fruits/vegetables as protein or carbohydrate foods for both lunch and dinner. (Half of each meal should be fruits/vegetables, one quarter protein, and one quarter starchy carbs)   Cut down on sweet beverages. This includes juice, soda, and sweet tea.    Exercise at least 150 minutes every week.

## 2019-10-05 NOTE — Assessment & Plan Note (Signed)
Chronic problem.  Worsening.  Given dysphagia symptoms, will place referral to GI.

## 2019-10-05 NOTE — Progress Notes (Signed)
   Chief Complaint:  Lindsay Reyes is a 68 y.o. female who presents today with a chief complaint of bilateral buttocks Reyes.   Assessment/Plan:  Bilateral buttocks Reyes No red flags.  Likely due to flare of her lumbar spine degenerative disc disease.  She has had good relief with diclofenac.  She will continue this.  Offered prescription for gabapentin however patient declined.  If continues to have significant issues, will need referral to orthopedics.  GERD Chronic problem.  Worsening.  Given dysphagia symptoms, will place referral to GI.     Subjective:  HPI:  Bilateral buttocks Reyes Started about 3 months ago.  Has had 2 significant flares.  No current symptoms.  Reyes lasted for several days.  Worse with movement worse with activity.  No obvious precipitating events.  Reyes is described as a achy and throbbing sensation.  She has used diclofenac with significant improvement.  She is doing home stretches and exercises.  No lower extremity weakness or numbness.  No pins-and-needles.  GERD  Chronic problem.  Worsening.  On Protonix 40 mg twice daily.  Is woken up a couple times in the middle the night last couple of weeks with severe reflux.  Is also had several episodes of feeling like food and beverages get stuck in the bottom of her throat.  ROS: Per HPI  PMH: She reports that she has never smoked. She has never used smokeless tobacco. She reports current alcohol use. She reports that she does not use drugs.      Objective:  Physical Exam: BP 124/78   Pulse 88   Temp 98 F (36.7 C)   Ht 5\' 4"  (1.626 m)   SpO2 98%   BMI 27.64 kg/m   Gen: NAD, resting comfortably CV: Regular rate and rhythm with no murmurs appreciated Pulm: Normal work of breathing, clear to auscultation bilaterally with no crackles, wheezes, or rhonchi GI: Normal bowel sounds present. Soft, Nontender, Nondistended. MSK: - Back: No deformities.  Slightly tender to palpation along bilateral lower lumbar  paraspinal muscles -Lower extremities: No deformities.  Strength 5 out of 5 throughout.  Straight leg raise negative bilaterally.  Neurovascular intact distally.     Lindsay Reyes. Lindsay Pain, MD 10/05/2019 8:51 AM

## 2019-10-06 ENCOUNTER — Encounter: Payer: Self-pay | Admitting: Gastroenterology

## 2019-11-15 ENCOUNTER — Encounter: Payer: Self-pay | Admitting: Gastroenterology

## 2019-11-15 ENCOUNTER — Other Ambulatory Visit: Payer: Self-pay

## 2019-11-15 ENCOUNTER — Ambulatory Visit: Payer: Medicare HMO | Admitting: Gastroenterology

## 2019-11-15 VITALS — BP 142/90 | HR 86 | Temp 97.4°F | Ht 64.0 in | Wt 168.0 lb

## 2019-11-15 DIAGNOSIS — K219 Gastro-esophageal reflux disease without esophagitis: Secondary | ICD-10-CM | POA: Diagnosis not present

## 2019-11-15 DIAGNOSIS — R1310 Dysphagia, unspecified: Secondary | ICD-10-CM

## 2019-11-15 DIAGNOSIS — Z1159 Encounter for screening for other viral diseases: Secondary | ICD-10-CM

## 2019-11-15 MED ORDER — DEXILANT 60 MG PO CPDR: 60.0000 mg | DELAYED_RELEASE_CAPSULE | Freq: Every day | ORAL | 11 refills | Status: DC

## 2019-11-15 NOTE — Patient Instructions (Signed)
Stop Protonix.  We have sent the following medications to your pharmacy for you to pick up at your convenience: [place].  You have been scheduled for an endoscopy. Please follow written instructions given to you at your visit today. If you use inhalers (even only as needed), please bring them with you on the day of your procedure.   Thank you for choosing me and [facility].  Pricilla Riffle. Dagoberto Ligas., MD., Marval Regal

## 2019-11-15 NOTE — Progress Notes (Signed)
History of Present Illness: This is a 68 year old female referred by Lindsay Barrack, MD for the evaluation of GERD and dysphagia.  She relates a long history of reflux with frequent heartburn, occasional regurgitation, occasional nighttime reflux. Her symptoms are under fair, partial control on daily pantoprazole. She frequently experiences breakthrough symptoms.  She relates dysphagia primarily to solids but occasionally to liquids over several years and her symptoms have worsened over the past several months.  EGD performed in 2008 for GERD and dysphagia was normal with an empiric dilation performed.  She no prior colonoscopy as she has declined colonoscopy. Denies weight loss, abdominal pain, constipation, diarrhea, change in stool caliber, melena, hematochezia, nausea, vomiting, chest pain.   No Known Allergies Outpatient Medications Prior to Visit  Medication Sig Dispense Refill  . diclofenac (VOLTAREN) 75 MG EC tablet Take 1 tablet (75 mg total) by mouth 2 (two) times daily. 30 tablet 0  . Multiple Vitamin (MULTIVITAMIN) tablet Take 1 tablet by mouth daily.    . pantoprazole (PROTONIX) 40 MG tablet Take 1 tablet (40 mg total) by mouth daily. 30 tablet 6  . QUEtiapine (SEROQUEL) 25 MG tablet Take 2 tablets (50 mg total) by mouth at bedtime. 180 tablet 3  . zolpidem (AMBIEN) 5 MG tablet Take 1 tablet (5 mg total) by mouth at bedtime as needed. 30 tablet 5  . azelastine (ASTELIN) 0.1 % nasal spray Place 2 sprays into both nostrils 2 (two) times daily. Use in each nostril as directed (Patient not taking: Reported on 11/15/2019) 30 mL 12  . calcitonin, salmon, (MIACALCIN) 200 UNIT/ACT nasal spray Place 1 spray into alternate nostrils daily. (Patient not taking: Reported on 11/15/2019) 3.7 mL 1  . esomeprazole (NEXIUM) 20 MG capsule Take 20 mg by mouth daily at 12 noon.    . triamcinolone ointment (KENALOG) 0.5 % Apply 1 application topically 2 (two) times daily. (Patient not taking: Reported on  11/15/2019) 30 g 0   No facility-administered medications prior to visit.    Past Medical History:  Diagnosis Date  . Arthritis   . Colon polyp   . Diverticulosis   . GERD (gastroesophageal reflux disease)   . Insomnia   . Retinal micro-aneurysm of right eye   . Scoliosis    Past Surgical History:  Procedure Laterality Date  . ABDOMINAL HYSTERECTOMY  2002  . COLONOSCOPY     Social History   Socioeconomic History  . Marital status: Single    Spouse name: Not on file  . Number of children: Not on file  . Years of education: Not on file  . Highest education level: Not on file  Occupational History  . Not on file  Social Needs  . Financial resource strain: Not on file  . Food insecurity    Worry: Not on file    Inability: Not on file  . Transportation needs    Medical: Not on file    Non-medical: Not on file  Tobacco Use  . Smoking status: Never Smoker  . Smokeless tobacco: Never Used  Substance and Sexual Activity  . Alcohol use: Yes    Comment: socially  . Drug use: No  . Sexual activity: Not on file  Lifestyle  . Physical activity    Days per week: Not on file    Minutes per session: Not on file  . Stress: Not on file  Relationships  . Social Herbalist on phone: Not on file    Gets  together: Not on file    Attends religious service: Not on file    Active member of club or organization: Not on file    Attends meetings of clubs or organizations: Not on file    Relationship status: Not on file  Other Topics Concern  . Not on file  Social History Narrative  . Not on file   Family History  Problem Relation Age of Onset  . Hypertension Mother   . Diabetes Mother   . Osteoporosis Mother   . Heart Problems Mother   . Thyroid cancer Sister        Review of Systems: Pertinent positive and negative review of systems were noted in the above HPI section. All other review of systems were otherwise negative.    Physical Exam: General: Well  developed, well nourished, no acute distress Head: Normocephalic and atraumatic Eyes:  sclerae anicteric, EOMI Ears: Normal auditory acuity Mouth: No deformity or lesions Neck: Supple, no masses or thyromegaly Lungs: Clear throughout to auscultation Heart: Regular rate and rhythm; no murmurs, rubs or bruits Abdomen: Soft, non tender and non distended. No masses, hepatosplenomegaly or hernias noted. Normal Bowel sounds Rectal: Not done Musculoskeletal: Symmetrical with no gross deformities  Skin: No lesions on visible extremities Pulses:  Normal pulses noted Extremities: No clubbing, cyanosis, edema or deformities noted Neurological: Alert oriented x 4, grossly nonfocal Cervical Nodes:  No significant cervical adenopathy Inguinal Nodes: No significant inguinal adenopathy Psychological:  Alert and cooperative. Normal mood and affect   Assessment and Recommendations:  1. GERD, dysphagia.  Rule out esophagitis, Barrett's, stricture.  She prefers a once daily reflux medication.  DC Protonix. Begin Dexilant 60 mg po qam. Follow antireflux measures.  Elevate head of bed 4 inches.  Schedule EGD, possible dilation. The risks (including bleeding, perforation, infection, missed lesions, medication reactions and possible hospitalization or surgery if complications occur), benefits, and alternatives to endoscopy with possible biopsy and possible dilation were discussed with the patient and they consent to proceed.   2.  CRC screening, average risk. She declines colonoscopy.  She states she will consider Cologuard and prefers to discuss further and obtain this through her PCP.    cc: Lindsay Barrack, MD [address] [place],  [place] [address]

## 2019-12-08 HISTORY — PX: UPPER GASTROINTESTINAL ENDOSCOPY: SHX188

## 2019-12-11 ENCOUNTER — Encounter: Payer: Medicare HMO | Admitting: Gastroenterology

## 2019-12-12 ENCOUNTER — Other Ambulatory Visit: Payer: Self-pay | Admitting: Gastroenterology

## 2019-12-12 ENCOUNTER — Ambulatory Visit (INDEPENDENT_AMBULATORY_CARE_PROVIDER_SITE_OTHER): Payer: Medicare HMO

## 2019-12-12 DIAGNOSIS — Z1159 Encounter for screening for other viral diseases: Secondary | ICD-10-CM

## 2019-12-13 LAB — SARS CORONAVIRUS 2 (TAT 6-24 HRS): SARS Coronavirus 2: NEGATIVE

## 2019-12-15 ENCOUNTER — Encounter: Payer: Self-pay | Admitting: Gastroenterology

## 2019-12-15 ENCOUNTER — Other Ambulatory Visit: Payer: Self-pay

## 2019-12-15 ENCOUNTER — Ambulatory Visit (AMBULATORY_SURGERY_CENTER): Payer: Medicare HMO | Admitting: Gastroenterology

## 2019-12-15 VITALS — BP 120/68 | HR 74 | Temp 98.3°F | Resp 18 | Ht 64.0 in | Wt 168.0 lb

## 2019-12-15 DIAGNOSIS — K449 Diaphragmatic hernia without obstruction or gangrene: Secondary | ICD-10-CM

## 2019-12-15 DIAGNOSIS — K219 Gastro-esophageal reflux disease without esophagitis: Secondary | ICD-10-CM

## 2019-12-15 DIAGNOSIS — K222 Esophageal obstruction: Secondary | ICD-10-CM

## 2019-12-15 DIAGNOSIS — R1310 Dysphagia, unspecified: Secondary | ICD-10-CM | POA: Diagnosis not present

## 2019-12-15 MED ORDER — SODIUM CHLORIDE 0.9 % IV SOLN: 500.0000 mL | Freq: Once | INTRAVENOUS | Status: DC

## 2019-12-15 MED ORDER — PANTOPRAZOLE SODIUM 40 MG PO TBEC: 40.0000 mg | DELAYED_RELEASE_TABLET | Freq: Two times a day (BID) | ORAL | 3 refills | Status: DC

## 2019-12-15 NOTE — Progress Notes (Signed)
Vitals- CW Temp- JB 

## 2019-12-15 NOTE — Progress Notes (Signed)
Report given to PACU, vss 

## 2019-12-15 NOTE — Progress Notes (Signed)
Called to room to assist during endoscopic procedure.  Patient ID and intended procedure confirmed with present staff. Received instructions for my participation in the procedure from the performing physician.  

## 2019-12-15 NOTE — Op Note (Addendum)
[place] Patient Name: Lindsay Reyes Procedure Date: 12/15/2019 1:41 PM MRN: [id number] Endoscopist: Ladene Artist , MD Age: 69 Referring MD:  Date of Birth: 1951-05-02 Gender: Female Account #: [id number] Procedure:                Upper GI endoscopy Indications:              Dysphagia, Gastroesophageal reflux disease Medicines:                Monitored Anesthesia Care Procedure:                Pre-Anesthesia Assessment:                           - Prior to the procedure, a History and Physical                            was performed, and patient medications and                            allergies were reviewed. The patient's tolerance of                            previous anesthesia was also reviewed. The risks                            and benefits of the procedure and the sedation                            options and risks were discussed with the patient.                            All questions were answered, and informed consent                            was obtained. Prior Anticoagulants: The patient has                            taken no previous anticoagulant or antiplatelet                            agents. ASA Grade Assessment: II - A patient with                            mild systemic disease. After reviewing the risks                            and benefits, the patient was deemed in                            satisfactory condition to undergo the procedure.                           After obtaining informed consent, the endoscope was  passed under direct vision. Throughout the                            procedure, the patient's blood pressure, pulse, and                            oxygen saturations were monitored continuously. The                            Endoscope was introduced through the mouth, and                            advanced to the second part of duodenum. The upper                            GI endoscopy  was accomplished without difficulty.                            The patient tolerated the procedure well. Scope In: Scope Out: Findings:                 One benign-appearing, intrinsic mild stenosis was                            found at the gastroesophageal junction. This                            stenosis measured 1.4 cm (inner diameter). The                            stenosis was traversed. A guidewire was placed and                            the scope was withdrawn. Dilations were performed                            with Savary dilators with mild resistance at 15 mm                            and 16 mm. No heme on dilators.                           The lumen of the proximal esophagus was mildly                            dilated.                           The exam of the esophagus was otherwise normal.                           A medium-sized hiatal hernia was present.                           The exam of  the stomach was otherwise normal.                           The duodenal bulb and second portion of the                            duodenum were normal. Complications:            No immediate complications. Estimated Blood Loss:     Estimated blood loss: none. Impression:               - Benign-appearing esophageal stenosis. Dilated.                           - Mild dilation in the proximal esophagus.                           - Medium-sized hiatal hernia.                           - Normal duodenal bulb and second portion of the                            duodenum.                           - No specimens collected. Recommendation:           - Patient has a contact number available for                            emergencies. The signs and symptoms of potential                            delayed complications were discussed with the                            patient. Return to normal activities tomorrow.                            Written discharge instructions were  provided to the                            patient.                           - Clear liquid diet for 2 hours, then advance as                            tolerated to soft diet today.                           - Resume prior diet tomorrow.                           - Follow antireflux measures.                           -  Continue present medications.                           - Return to GI office in 6 weeks. Consider                            esophageal manometry if dysphagia not resolved.                           - Protonix (pantoprazole) 40 mg PO BID, 1 year of                            refills. Ladene Artist, MD 12/15/2019 2:00:50 PM This report has been signed electronically.

## 2019-12-15 NOTE — Patient Instructions (Signed)
Thank you for allowing [place] to care for you today!  Follow a soft food diet today,  Handout provided.  Return to your normal activities tomorrow.  Return to GI office in 6 weeks.  We will call to arrange this appointment.  Prescription for Protonix (Pantoprazole) has been sent to [facility] on [facility].     YOU HAD AN ENDOSCOPIC PROCEDURE TODAY AT [facility]:   Refer to the procedure report that was given to you for any specific questions about what was found during the examination.  If the procedure report does not answer your questions, please call your gastroenterologist to clarify.  If you requested that your care partner not be given the details of your procedure findings, then the procedure report has been included in a sealed envelope for you to review at your convenience later.  YOU SHOULD EXPECT: Some feelings of bloating in the abdomen. Passage of more gas than usual.  Walking can help get rid of the air that was put into your GI tract during the procedure and reduce the bloating. If you had a lower endoscopy (such as a colonoscopy or flexible sigmoidoscopy) you may notice spotting of blood in your stool or on the toilet paper. If you underwent a bowel prep for your procedure, you may not have a normal bowel movement for a few days.  Please Note:  You might notice some irritation and congestion in your nose or some drainage.  This is from the oxygen used during your procedure.  There is no need for concern and it should clear up in a day or so.  SYMPTOMS TO REPORT IMMEDIATELY:    Following upper endoscopy (EGD)  Vomiting of blood or coffee ground material  New chest pain or pain under the shoulder blades  Painful or persistently difficult swallowing  New shortness of breath  Fever of 100F or higher  Black, tarry-looking stools  For urgent or emergent issues, a gastroenterologist can be reached at any hour by calling [phone].   DIET:  We do  recommend a small meal at first, but then you may proceed to your regular diet.  Drink plenty of fluids but you should avoid alcoholic beverages for 24 hours.  ACTIVITY:  You should plan to take it easy for the rest of today and you should NOT DRIVE or use heavy machinery until tomorrow (because of the sedation medicines used during the test).    FOLLOW UP: Our staff will call the number listed on your records 48-72 hours following your procedure to check on you and address any questions or concerns that you may have regarding the information given to you following your procedure. If we do not reach you, we will leave a message.  We will attempt to reach you two times.  During this call, we will ask if you have developed any symptoms of COVID 19. If you develop any symptoms (ie: fever, flu-like symptoms, shortness of breath, cough etc.) before then, please call [phone].  If you test positive for Covid 19 in the 2 weeks post procedure, please call and report this information to [place].    If any biopsies were taken you will be contacted by phone or by letter within the next 1-3 weeks.  Please call us at [phone] if you have not heard about the biopsies in 3 weeks.    SIGNATURES/CONFIDENTIALITY: You and/or your care partner have signed paperwork which will be entered into your electronic medical record.  These signatures attest to the fact that that the information above on your After Visit Summary has been reviewed and is understood.  Full responsibility of the confidentiality of this discharge information lies with you and/or your care-partner.YOU HAD AN ENDOSCOPIC PROCEDURE TODAY AT [facility]:   Refer to the procedure report that was given to you for any specific questions about what was found during the examination.  If the procedure report does not answer your questions, please call your gastroenterologist to clarify.  If you requested that your care partner not be given the  details of your procedure findings, then the procedure report has been included in a sealed envelope for you to review at your convenience later.  YOU SHOULD EXPECT: Some feelings of bloating in the abdomen. Passage of more gas than usual.  Walking can help get rid of the air that was put into your GI tract during the procedure and reduce the bloating. If you had a lower endoscopy (such as a colonoscopy or flexible sigmoidoscopy) you may notice spotting of blood in your stool or on the toilet paper. If you underwent a bowel prep for your procedure, you may not have a normal bowel movement for a few days.  Please Note:  You might notice some irritation and congestion in your nose or some drainage.  This is from the oxygen used during your procedure.  There is no need for concern and it should clear up in a day or so.  SYMPTOMS TO REPORT IMMEDIATELY:   Following lower endoscopy (colonoscopy or flexible sigmoidoscopy):  Excessive amounts of blood in the stool  Significant tenderness or worsening of abdominal pains  Swelling of the abdomen that is new, acute  Fever of 100F or higher   Following upper endoscopy (EGD)  Vomiting of blood or coffee ground material  New chest pain or pain under the shoulder blades  Painful or persistently difficult swallowing  New shortness of breath  Fever of 100F or higher  Black, tarry-looking stools  For urgent or emergent issues, a gastroenterologist can be reached at any hour by calling [phone].   DIET:  We do recommend a small meal at first, but then you may proceed to your regular diet.  Drink plenty of fluids but you should avoid alcoholic beverages for 24 hours.  ACTIVITY:  You should plan to take it easy for the rest of today and you should NOT DRIVE or use heavy machinery until tomorrow (because of the sedation medicines used during the test).    FOLLOW UP: Our staff will call the number listed on your records 48-72 hours following your  procedure to check on you and address any questions or concerns that you may have regarding the information given to you following your procedure. If we do not reach you, we will leave a message.  We will attempt to reach you two times.  During this call, we will ask if you have developed any symptoms of COVID 19. If you develop any symptoms (ie: fever, flu-like symptoms, shortness of breath, cough etc.) before then, please call [phone].  If you test positive for Covid 19 in the 2 weeks post procedure, please call and report this information to [place].    If any biopsies were taken you will be contacted by phone or by letter within the next 1-3 weeks.  Please call us at [phone] if you have not heard about the biopsies in 3 weeks.    SIGNATURES/CONFIDENTIALITY: You  and/or your care partner have signed paperwork which will be entered into your electronic medical record.  These signatures attest to the fact that that the information above on your After Visit Summary has been reviewed and is understood.  Full responsibility of the confidentiality of this discharge information lies with you and/or your care-partner.

## 2019-12-19 ENCOUNTER — Telehealth: Payer: Self-pay | Admitting: *Deleted

## 2019-12-19 NOTE — Telephone Encounter (Signed)
  Follow up Call-  Call back number 12/15/2019  Post procedure Call Back phone  # [phone]  Permission to leave phone message Yes  Some recent data might be hidden     Patient questions:  Do you have a fever, pain , or abdominal swelling? No. Pain Score  0 *  Have you tolerated food without any problems? Yes.    Have you been able to return to your normal activities? Yes.    Do you have any questions about your discharge instructions: Diet   No. Medications  No. Follow up visit  No.  Do you have questions or concerns about your Care? No.  Actions: * If pain score is 4 or above: No action needed, pain <4.   1. Have you developed a fever since your procedure? no  2.   Have you had an respiratory symptoms (SOB or cough) since your procedure? no   3.   Have you tested positive for COVID 19 since your procedure no  4.   Have you had any family members/close contacts diagnosed with the COVID 19 since your procedure?  no   If yes to any of these questions please route to Joylene John, RN and Alphonsa Gin, Therapist, sports.

## 2020-01-13 ENCOUNTER — Other Ambulatory Visit: Payer: Self-pay | Admitting: Family Medicine

## 2020-01-15 ENCOUNTER — Other Ambulatory Visit: Payer: Self-pay | Admitting: Family Medicine

## 2020-01-15 ENCOUNTER — Other Ambulatory Visit: Payer: Self-pay

## 2020-01-15 ENCOUNTER — Ambulatory Visit (INDEPENDENT_AMBULATORY_CARE_PROVIDER_SITE_OTHER): Payer: Medicare HMO

## 2020-01-15 ENCOUNTER — Telehealth: Payer: Self-pay | Admitting: Family Medicine

## 2020-01-15 DIAGNOSIS — Z0000 Encounter for general adult medical examination without abnormal findings: Secondary | ICD-10-CM | POA: Diagnosis not present

## 2020-01-15 MED ORDER — ZOLPIDEM TARTRATE 10 MG PO TABS: 10.0000 mg | ORAL_TABLET | Freq: Every evening | ORAL | 5 refills | Status: DC | PRN

## 2020-01-15 NOTE — Telephone Encounter (Signed)
Rx request 

## 2020-01-15 NOTE — Telephone Encounter (Signed)
MEDICATION: Zolpidem  [place], [place]  Comments: Please advise. Pt upset.   **Let patient know to contact pharmacy at the end of the day to make sure medication is ready. **  ** Please notify patient to allow 48-72 hours to process**  **Encourage patient to contact the pharmacy for refills or they can request refills through [facility]**

## 2020-01-15 NOTE — Telephone Encounter (Signed)
Requesting Rx looks like its discontinued

## 2020-01-15 NOTE — Progress Notes (Signed)
This visit is being conducted via phone call due to the COVID-19 pandemic. This patient has given me verbal consent via phone to conduct this visit, patient states they are participating from their home address. Some vital signs may be absent or patient reported.   Patient identification: identified by name, DOB, and current address.  Location provider: [facility], Office Persons participating in the virtual visit: Denman George LPN, patient, and Dr. Dimas Chyle   Subjective:   Lindsay Reyes is a 69 y.o. female who presents for an Initial Medicare Annual Wellness Visit.  Review of Systems     Cardiac Risk Factors include: advanced age (>65men, >77 women)    Objective:    There were no vitals filed for this visit. There is no height or weight on file to calculate BMI.  Advanced Directives 01/15/2020 05/03/2017 04/16/2017 09/10/2013  Does Patient Have a Medical Advance Directive? No No No Patient does not have advance directive;Patient would like information  Would patient like information on creating a medical advance directive? No - Patient declined - - Advance directive packet given    Current Medications (verified) Outpatient Encounter Medications as of 01/15/2020  Medication Sig  . dexlansoprazole (DEXILANT) 60 MG capsule Take 1 capsule (60 mg total) by mouth daily.  . diclofenac (VOLTAREN) 75 MG EC tablet Take 1 tablet (75 mg total) by mouth 2 (two) times daily.  . Multiple Vitamin (MULTIVITAMIN) tablet Take 1 tablet by mouth daily.  . pantoprazole (PROTONIX) 40 MG tablet Take 1 tablet (40 mg total) by mouth 2 (two) times daily before a meal.  . QUEtiapine (SEROQUEL) 25 MG tablet Take 2 tablets (50 mg total) by mouth at bedtime.   No facility-administered encounter medications on file as of 01/15/2020.    Allergies (verified) Patient has no known allergies.   History: Past Medical History:  Diagnosis Date  . Arthritis   . Colon polyp   . Diverticulosis   . GERD  (gastroesophageal reflux disease)   . Insomnia   . Retinal micro-aneurysm of right eye   . Scoliosis    Past Surgical History:  Procedure Laterality Date  . ABDOMINAL HYSTERECTOMY  2002  . COLONOSCOPY     Family History  Problem Relation Age of Onset  . Hypertension Mother   . Diabetes Mother   . Osteoporosis Mother   . Heart Problems Mother   . Thyroid cancer Sister   . Colon cancer Neg Hx    Social History   Socioeconomic History  . Marital status: Single    Spouse name: Not on file  . Number of children: Not on file  . Years of education: Not on file  . Highest education level: Not on file  Occupational History  . Not on file  Tobacco Use  . Smoking status: Never Smoker  . Smokeless tobacco: Never Used  Substance and Sexual Activity  . Alcohol use: Yes    Comment: socially  . Drug use: No  . Sexual activity: Not on file  Other Topics Concern  . Not on file  Social History Narrative  . Not on file   Social Determinants of Health   Financial Resource Strain:   . Difficulty of Paying Living Expenses: Not on file  Food Insecurity:   . Worried About Charity fundraiser in the Last Year: Not on file  . Ran Out of Food in the Last Year: Not on file  Transportation Needs:   . Lack of Transportation (Medical): Not on  file  . Lack of Transportation (Non-Medical): Not on file  Physical Activity:   . Days of Exercise per Week: Not on file  . Minutes of Exercise per Session: Not on file  Stress:   . Feeling of Stress : Not on file  Social Connections:   . Frequency of Communication with Friends and Family: Not on file  . Frequency of Social Gatherings with Friends and Family: Not on file  . Attends Religious Services: Not on file  . Active Member of Clubs or Organizations: Not on file  . Attends Archivist Meetings: Not on file  . Marital Status: Not on file    Tobacco Counseling Counseling given: Not Answered   Clinical Intake:  Pre-visit  preparation completed: Yes  Pain : No/denies pain  Diabetes: No  How often do you need to have someone help you when you read instructions, pamphlets, or other written materials from your doctor or pharmacy?: 1 - Never  Interpreter Needed?: No  Information entered by :: Denman George LPN   Activities of Daily Living In your present state of health, do you have any difficulty performing the following activities: 01/15/2020  Hearing? N  Vision? N  Difficulty concentrating or making decisions? N  Walking or climbing stairs? N  Dressing or bathing? N  Doing errands, shopping? N  Preparing Food and eating ? N  Using the Toilet? N  In the past six months, have you accidently leaked urine? N  Do you have problems with loss of bowel control? N  Managing your Medications? N  Managing your Finances? N  Housekeeping or managing your Housekeeping? N  Some recent data might be hidden     Immunizations and Health Maintenance Immunization History  Administered Date(s) Administered  . Td 12/08/2003  . Tdap 05/23/2014  . Zoster Recombinat (Shingrix) 12/26/2019   There are no preventive care reminders to display for this patient.  Patient Care Team: [name], MD as PCP - General (Family Medicine) Ladene Artist, MD as Consulting Physician (Gastroenterology) Gerda Diss, DO as Consulting Physician (Sports Medicine) Sherryl Barters, OD (Optometry)  Indicate any recent Medical Services you may have received from other than [facility] providers in the past year (date may be approximate).     Assessment:   This is a routine wellness examination for Lindsay Reyes.  Hearing/Vision screen No exam data present  Dietary issues and exercise activities discussed: Current Exercise Habits: The patient does not participate in regular exercise at present  Goals   None    Depression Screen PHQ 2/9 Scores 01/15/2020 03/01/2018  PHQ - 2 Score - 6  PHQ- 9 Score - 25  Exception Documentation  Patient refusal -    Fall Risk Fall Risk  01/15/2020 09/29/2018 06/30/2018 03/01/2018  Falls in the past year? 0 No Yes No  Number falls in past yr: 0 - 2 or more -  Injury with Fall? 0 - No -  Risk Factor Category  - - High Fall Risk -  Follow up Falls evaluation completed;Education provided;Falls prevention discussed - - -    Is the patient's home free of loose throw rugs in walkways, pet beds, electrical cords, etc?   yes      Grab bars in the bathroom? yes      Handrails on the stairs?   yes      Adequate lighting?   yes  Cognitive Function:     6CIT Screen 01/15/2020  What Year? 0 points  What month? 0 points  What time? 0 points  Count back from 20 0 points  Months in reverse 0 points  Repeat phrase 0 points  Total Score 0    Screening Tests Health Maintenance  Topic Date Due  . INFLUENZA VACCINE  03/06/2020 (Originally 07/08/2019)  . COLONOSCOPY  07/15/2031 (Originally 05/01/2002)  . TETANUS/TDAP  05/23/2024  . DEXA SCAN  Completed  . Hepatitis C Screening  Completed  . MAMMOGRAM  Discontinued  . PNA vac Low Risk Adult  Discontinued    Qualifies for Shingles Vaccine? Patient has completed #1 in series and is aware that 2nd is needed   Cancer Screenings: Lung: Low Dose CT Chest recommended if Age 79-80 years, 30 pack-year currently smoking OR have quit w/in 15years. Patient does not qualify. Breast: Up to date on Mammogram? No, patient declines screening  Up to date of Bone Density/Dexa? Yes Colorectal: Patient declines screening   Plan:  I have personally reviewed and addressed the Medicare Annual Wellness questionnaire and have noted the following in the patient's chart:  A. Medical and social history B. Use of alcohol, tobacco or illicit drugs  C. Current medications and supplements D. Functional ability and status E.  Nutritional status F.  Physical activity G. Advance directives H. List of other physicians I.  Hospitalizations, surgeries, and ER visits in  previous 12 months J.  [place] such as hearing and vision if needed, cognitive and depression L. Referrals, records requested, and appointments- none   In addition, I have reviewed and discussed with patient certain preventive protocols, quality metrics, and best practice recommendations. A written personalized care plan for preventive services as well as general preventive health recommendations were provided to patient.   Signed,  Denman George, LPN  Nurse Health Advisor   Nurse Notes: Patient is declining all screenings and additional vaccines at this time

## 2020-01-15 NOTE — Patient Instructions (Signed)
Lindsay Reyes , Thank you for taking time to come for your Medicare Wellness Visit. I appreciate your ongoing commitment to your health goals. Please review the following plan we discussed and let me know if I can assist you in the future.   Screening recommendations/referrals: Colorectal Screening: recommended Mammogram: recommended Bone Density: recommended  Vision and Dental Exams: Recommended annual ophthalmology exams for early detection of glaucoma and other disorders of the eye Recommended annual dental exams for proper oral hygiene  Vaccinations: Influenza vaccine: recommended Pneumococcal vaccine: recommended Tdap vaccine: up to date; last 05/23/14  Shingles vaccine: 1st received 12/26/19   Goals: Recommend to drink at least 6-8 8oz glasses of water per day and consume a balanced diet rich in fresh fruits and vegetables.   Next appointment: Please schedule your Annual Wellness Visit with your Nurse Health Advisor in one year.  Preventive Care 58 Years and Older, Female Preventive care refers to lifestyle choices and visits with your health care provider that can promote health and wellness. What does preventive care include?  A yearly physical exam. This is also called an annual well check.  Dental exams once or twice a year.  Routine eye exams. Ask your health care provider how often you should have your eyes checked.  Personal lifestyle choices, including:  Daily care of your teeth and gums.  Regular physical activity.  Eating a healthy diet.  Avoiding tobacco and drug use.  Limiting alcohol use.  Practicing safe sex.  Taking low-dose aspirin every day if recommended by your health care provider.  Taking vitamin and mineral supplements as recommended by your health care provider. What happens during an annual well check? The services and screenings done by your health care provider during your annual well check will depend on your age, overall health,  lifestyle risk factors, and family history of disease. Counseling  Your health care provider may ask you questions about your:  Alcohol use.  Tobacco use.  Drug use.  Emotional well-being.  Home and relationship well-being.  Sexual activity.  Eating habits.  History of falls.  Memory and ability to understand (cognition).  Work and work Statistician.  Reproductive health. Screening  You may have the following tests or measurements:  Height, weight, and BMI.  Blood pressure.  Lipid and cholesterol levels. These may be checked every 5 years, or more frequently if you are over 65 years old.  Skin check.  Lung cancer screening. You may have this screening every year starting at age 53 if you have a 30-pack-year history of smoking and currently smoke or have quit within the past 15 years.  Fecal occult blood test (FOBT) of the stool. You may have this test every year starting at age 35.  Flexible sigmoidoscopy or colonoscopy. You may have a sigmoidoscopy every 5 years or a colonoscopy every 10 years starting at age 25.  Hepatitis C blood test.  Hepatitis B blood test.  Sexually transmitted disease (STD) testing.  Diabetes screening. This is done by checking your blood sugar (glucose) after you have not eaten for a while (fasting). You may have this done every 1-3 years.  Bone density scan. This is done to screen for osteoporosis. You may have this done starting at age 8.  Mammogram. This may be done every 1-2 years. Talk to your health care provider about how often you should have regular mammograms. Talk with your health care provider about your test results, treatment options, and if necessary, the need for more tests.  Vaccines  Your health care provider may recommend certain vaccines, such as:  Influenza vaccine. This is recommended every year.  Tetanus, diphtheria, and acellular pertussis (Tdap, Td) vaccine. You may need a Td booster every 10 years.  Zoster  vaccine. You may need this after age 24.  Pneumococcal 13-valent conjugate (PCV13) vaccine. One dose is recommended after age 47.  Pneumococcal polysaccharide (PPSV23) vaccine. One dose is recommended after age 37. Talk to your health care provider about which screenings and vaccines you need and how often you need them. This information is not intended to replace advice given to you by your health care provider. Make sure you discuss any questions you have with your health care provider. Document Released: 12/20/2015 Document Revised: 08/12/2016 Document Reviewed: 09/24/2015 Elsevier Interactive Patient Education  2017 [employer] Prevention in the Home Falls can cause injuries. They can happen to people of all ages. There are many things you can do to make your home safe and to help prevent falls. What can I do on the outside of my home?  Regularly fix the edges of walkways and driveways and fix any cracks.  Remove anything that might make you trip as you walk through a door, such as a raised step or threshold.  Trim any bushes or trees on the path to your home.  Use bright outdoor lighting.  Clear any walking paths of anything that might make someone trip, such as rocks or tools.  Regularly check to see if handrails are loose or broken. Make sure that both sides of any steps have handrails.  Any raised decks and porches should have guardrails on the edges.  Have any leaves, snow, or ice cleared regularly.  Use sand or salt on walking paths during winter.  Clean up any spills in your garage right away. This includes oil or grease spills. What can I do in the bathroom?  Use night lights.  Install grab bars by the toilet and in the tub and shower. Do not use towel bars as grab bars.  Use non-skid mats or decals in the tub or shower.  If you need to sit down in the shower, use a plastic, non-slip stool.  Keep the floor dry. Clean up any water that spills on the  floor as soon as it happens.  Remove soap buildup in the tub or shower regularly.  Attach bath mats securely with double-sided non-slip rug tape.  Do not have throw rugs and other things on the floor that can make you trip. What can I do in the bedroom?  Use night lights.  Make sure that you have a light by your bed that is easy to reach.  Do not use any sheets or blankets that are too big for your bed. They should not hang down onto the floor.  Have a firm chair that has side arms. You can use this for support while you get dressed.  Do not have throw rugs and other things on the floor that can make you trip. What can I do in the kitchen?  Clean up any spills right away.  Avoid walking on wet floors.  Keep items that you use a lot in easy-to-reach places.  If you need to reach something above you, use a strong step stool that has a grab bar.  Keep electrical cords out of the way.  Do not use floor polish or wax that makes floors slippery. If you must use wax, use non-skid floor wax.  Do not have throw rugs and other things on the floor that can make you trip. What can I do with my stairs?  Do not leave any items on the stairs.  Make sure that there are handrails on both sides of the stairs and use them. Fix handrails that are broken or loose. Make sure that handrails are as long as the stairways.  Check any carpeting to make sure that it is firmly attached to the stairs. Fix any carpet that is loose or worn.  Avoid having throw rugs at the top or bottom of the stairs. If you do have throw rugs, attach them to the floor with carpet tape.  Make sure that you have a light switch at the top of the stairs and the bottom of the stairs. If you do not have them, ask someone to add them for you. What else can I do to help prevent falls?  Wear shoes that:  Do not have high heels.  Have rubber bottoms.  Are comfortable and fit you well.  Are closed at the toe. Do not wear  sandals.  If you use a stepladder:  Make sure that it is fully opened. Do not climb a closed stepladder.  Make sure that both sides of the stepladder are locked into place.  Ask someone to hold it for you, if possible.  Clearly mark and make sure that you can see:  Any grab bars or handrails.  First and last steps.  Where the edge of each step is.  Use tools that help you move around (mobility aids) if they are needed. These include:  Canes.  Walkers.  Scooters.  Crutches.  Turn on the lights when you go into a dark area. Replace any light bulbs as soon as they burn out.  Set up your furniture so you have a clear path. Avoid moving your furniture around.  If any of your floors are uneven, fix them.  If there are any pets around you, be aware of where they are.  Review your medicines with your doctor. Some medicines can make you feel dizzy. This can increase your chance of falling. Ask your doctor what other things that you can do to help prevent falls. This information is not intended to replace advice given to you by your health care provider. Make sure you discuss any questions you have with your health care provider. Document Released: 09/19/2009 Document Revised: 04/30/2016 Document Reviewed: 12/28/2014 Elsevier Interactive Patient Education  2017 [employer].

## 2020-02-08 ENCOUNTER — Telehealth: Payer: Self-pay | Admitting: Family Medicine

## 2020-02-08 NOTE — Telephone Encounter (Signed)
Patient called and would like Dr. [name] to send a referral for her to see a Podiatrist.

## 2020-02-09 NOTE — Telephone Encounter (Signed)
OK for Podiatry referral? Please advise

## 2020-02-12 NOTE — Telephone Encounter (Signed)
Ok with me. Please place any necessary orders. 

## 2020-02-13 ENCOUNTER — Other Ambulatory Visit: Payer: Self-pay

## 2020-02-13 DIAGNOSIS — L600 Ingrowing nail: Secondary | ICD-10-CM

## 2020-02-13 NOTE — Telephone Encounter (Signed)
Referral placed. Patient notified. 

## 2020-03-04 ENCOUNTER — Other Ambulatory Visit: Payer: Self-pay | Admitting: *Deleted

## 2020-03-04 ENCOUNTER — Telehealth: Payer: Self-pay | Admitting: Family Medicine

## 2020-03-04 NOTE — Progress Notes (Unsigned)
ACUATI

## 2020-03-04 NOTE — Telephone Encounter (Signed)
Patient called in and wanted to see if Dr. [name] could put in a referral for her to [facility]. She having Lower back pain and wants to try Aquatic Therapy.  [facility]  [address] [place], [place] [address] Tel: [phone]

## 2020-03-04 NOTE — Telephone Encounter (Signed)
Ok with me. Please place any necessary orders. 

## 2020-03-04 NOTE — Telephone Encounter (Signed)
Pleased advise.  

## 2020-03-05 ENCOUNTER — Other Ambulatory Visit: Payer: Self-pay

## 2020-03-05 DIAGNOSIS — M5430 Sciatica, unspecified side: Secondary | ICD-10-CM

## 2020-03-05 NOTE — Telephone Encounter (Signed)
Referral placed.

## 2020-03-12 DIAGNOSIS — L821 Other seborrheic keratosis: Secondary | ICD-10-CM | POA: Diagnosis not present

## 2020-03-12 DIAGNOSIS — D485 Neoplasm of uncertain behavior of skin: Secondary | ICD-10-CM | POA: Diagnosis not present

## 2020-03-12 DIAGNOSIS — D225 Melanocytic nevi of trunk: Secondary | ICD-10-CM | POA: Diagnosis not present

## 2020-03-12 DIAGNOSIS — I781 Nevus, non-neoplastic: Secondary | ICD-10-CM | POA: Diagnosis not present

## 2020-03-12 DIAGNOSIS — L565 Disseminated superficial actinic porokeratosis (DSAP): Secondary | ICD-10-CM | POA: Diagnosis not present

## 2020-03-12 DIAGNOSIS — D1801 Hemangioma of skin and subcutaneous tissue: Secondary | ICD-10-CM | POA: Diagnosis not present

## 2020-04-04 ENCOUNTER — Telehealth: Payer: Self-pay | Admitting: Family Medicine

## 2020-04-04 NOTE — Telephone Encounter (Signed)
Spoke with patient schedule a appointment

## 2020-04-04 NOTE — Telephone Encounter (Signed)
Pt called requesting a report of her bone density results. Pt asked if they could be sent to her in the mail. Pt also wanted to follow up when she requested to begin water physical therapy. Please advise.

## 2020-04-08 ENCOUNTER — Encounter: Payer: Self-pay | Admitting: Family Medicine

## 2020-04-08 ENCOUNTER — Telehealth (INDEPENDENT_AMBULATORY_CARE_PROVIDER_SITE_OTHER): Payer: Medicare HMO | Admitting: Family Medicine

## 2020-04-08 DIAGNOSIS — M545 Low back pain, unspecified: Secondary | ICD-10-CM

## 2020-04-08 NOTE — Progress Notes (Signed)
   Lindsay Reyes is a 69 y.o. female who presents today for a telephone visit.  Assessment/Plan:  Chronic Problems Addressed Today: LOW BACK PAIN, CHRONIC Continue physical therapy.  Will place referral today.     Subjective:  HPI:  Patient has had flareup of her back pain.  Will occasionally radiate into her legs.  She has been working with physical therapy which seems to help.       Objective/Observations   NAD  Telephone Visit   I connected with Lindsay Reyes on 04/08/20 at 11:20 AM EDT via telephone and verified that I am speaking with the correct person using two identifiers. I discussed the limitations of evaluation and management by telemedicine and the availability of in person appointments. The patient expressed understanding and agreed to proceed.   Patient location: Home Provider location: [place] participating in the virtual visit: Myself and Patient  A total of 8 minutes were spent on medical discussion.      Algis Greenhouse. [name], MD 04/08/2020 11:58 AM

## 2020-04-08 NOTE — Assessment & Plan Note (Signed)
Continue physical therapy.  Will place referral today.

## 2020-04-11 ENCOUNTER — Telehealth: Payer: Self-pay

## 2020-04-11 NOTE — Telephone Encounter (Signed)
Patient would like a bone density scan. Patient is scheduled to come into the office on 04/18/20.

## 2020-04-15 NOTE — Telephone Encounter (Signed)
Noted  

## 2020-04-17 ENCOUNTER — Telehealth: Payer: Self-pay | Admitting: Family Medicine

## 2020-04-17 ENCOUNTER — Other Ambulatory Visit: Payer: Self-pay

## 2020-04-17 DIAGNOSIS — M8580 Other specified disorders of bone density and structure, unspecified site: Secondary | ICD-10-CM

## 2020-04-17 NOTE — Telephone Encounter (Signed)
Referral has been placed. 

## 2020-04-17 NOTE — Progress Notes (Signed)
bone

## 2020-04-17 NOTE — Telephone Encounter (Signed)
Pt is requesting to have a referral put in for a bone density scan. Please advise.

## 2020-04-18 ENCOUNTER — Ambulatory Visit: Payer: Medicare HMO | Admitting: Family Medicine

## 2020-04-25 ENCOUNTER — Ambulatory Visit (INDEPENDENT_AMBULATORY_CARE_PROVIDER_SITE_OTHER): Payer: Medicare HMO | Admitting: Family Medicine

## 2020-04-25 ENCOUNTER — Other Ambulatory Visit: Payer: Self-pay

## 2020-04-25 ENCOUNTER — Encounter: Payer: Self-pay | Admitting: Family Medicine

## 2020-04-25 VITALS — BP 120/80 | HR 91 | Temp 97.9°F | Ht 64.0 in | Wt 167.0 lb

## 2020-04-25 DIAGNOSIS — E785 Hyperlipidemia, unspecified: Secondary | ICD-10-CM

## 2020-04-25 DIAGNOSIS — M8580 Other specified disorders of bone density and structure, unspecified site: Secondary | ICD-10-CM

## 2020-04-25 DIAGNOSIS — Z6826 Body mass index (BMI) 26.0-26.9, adult: Secondary | ICD-10-CM

## 2020-04-25 DIAGNOSIS — G4700 Insomnia, unspecified: Secondary | ICD-10-CM | POA: Diagnosis not present

## 2020-04-25 DIAGNOSIS — R0989 Other specified symptoms and signs involving the circulatory and respiratory systems: Secondary | ICD-10-CM | POA: Insufficient documentation

## 2020-04-25 DIAGNOSIS — R739 Hyperglycemia, unspecified: Secondary | ICD-10-CM | POA: Diagnosis not present

## 2020-04-25 DIAGNOSIS — Z0001 Encounter for general adult medical examination with abnormal findings: Secondary | ICD-10-CM | POA: Diagnosis not present

## 2020-04-25 LAB — LIPID PANEL
Cholesterol: 215 mg/dL — ABNORMAL HIGH (ref 0–200)
HDL: 39.6 mg/dL (ref >39.00)
LDL Cholesterol: 150 mg/dL — ABNORMAL HIGH (ref 0–99)
NonHDL: 175.15
Total CHOL/HDL Ratio: 5
Triglycerides: 128 mg/dL (ref 0.0–149.0)
VLDL: 25.6 mg/dL (ref 0.0–40.0)

## 2020-04-25 LAB — CBC
HCT: 42.1 % (ref 36.0–46.0)
Hemoglobin: 14 g/dL (ref 12.0–15.0)
MCHC: 33.3 g/dL (ref 30.0–36.0)
MCV: 89.1 fl (ref 78.0–100.0)
Platelets: 148 10*3/uL — ABNORMAL LOW (ref 150.0–400.0)
RBC: 4.73 Mil/uL (ref 3.87–5.11)
RDW: 12.7 % (ref 11.5–15.5)
WBC: 4.8 10*3/uL (ref 4.0–10.5)

## 2020-04-25 LAB — COMPREHENSIVE METABOLIC PANEL
ALT: 9 U/L (ref 0–35)
AST: 16 U/L (ref 0–37)
Albumin: 4.1 g/dL (ref 3.5–5.2)
Alkaline Phosphatase: 108 U/L (ref 39–117)
BUN: 18 mg/dL (ref 6–23)
CO2: 26 mEq/L (ref 19–32)
Calcium: 9.9 mg/dL (ref 8.4–10.5)
Chloride: 107 mEq/L (ref 96–112)
Creatinine, Ser: 0.83 mg/dL (ref 0.40–1.20)
GFR: 68.37 mL/min (ref >60.00)
Glucose, Bld: 100 mg/dL — ABNORMAL HIGH (ref 70–99)
Potassium: 3.9 mEq/L (ref 3.5–5.1)
Sodium: 138 mEq/L (ref 135–145)
Total Bilirubin: 0.6 mg/dL (ref 0.2–1.2)
Total Protein: 6.7 g/dL (ref 6.0–8.3)

## 2020-04-25 LAB — TSH: TSH: 3.49 u[IU]/mL (ref 0.35–4.50)

## 2020-04-25 LAB — HEMOGLOBIN A1C: Hgb A1c MFr Bld: 5.4 % (ref 4.6–6.5)

## 2020-04-25 NOTE — Assessment & Plan Note (Signed)
Will be getting bone density scan next week.

## 2020-04-25 NOTE — Progress Notes (Signed)
 Chief Complaint:  Lindsay Reyes is a 68 y.o. female who presents today for her annual comprehensive physical exam.    Assessment/Plan:  Chronic Problems Addressed Today: Prominent carotid artery No current symptoms.  Recent carotid Doppler within the past few years is normal.  Continue with watchful waiting.  Osteopenia Will be getting bone density scan next week.  Insomnia Continue Seroquel 50 mg nightly and Ambien 10 mg nightly as needed.  Body mass index is 28.67 kg/m. / Overweight BMI Metric Follow Up - 04/25/20 0943      BMI Metric Follow Up-Please document annually   BMI Metric Follow Up  Education provided       Preventative Healthcare: Check CBC, C met, TSH, lipid panel.  Bone density scan pending.  Patient Counseling(The following topics were reviewed and/or handout was given):  -Nutrition: Stressed importance of moderation in sodium/caffeine intake, saturated fat and cholesterol, caloric balance, sufficient intake of fresh fruits, vegetables, and fiber.  -Stressed the importance of regular exercise.   -Substance Abuse: Discussed cessation/primary prevention of tobacco, alcohol, or other drug use; driving or other dangerous activities under the influence; availability of treatment for abuse.   -Injury prevention: Discussed safety belts, safety helmets, smoke detector, smoking near bedding or upholstery.   -Sexuality: Discussed sexually transmitted diseases, partner selection, use of condoms, avoidance of unintended pregnancy and contraceptive alternatives.   -Dental health: Discussed importance of regular tooth brushing, flossing, and dental visits.  -Health maintenance and immunizations reviewed. Please refer to Health maintenance section.  Return to care in 1 year for next preventative visit.     Subjective:  HPI:  She has no acute complaints today.   Lifestyle Diet: Trying to eat a more healthy diet.  Exercise: Trying to do more swimming.   Depression  screen PHQ 2/9 03/01/2018  Decreased Interest 3  Down, Depressed, Hopeless 3  PHQ - 2 Score 6  Altered sleeping 3  Tired, decreased energy 3  Change in appetite 2  Feeling bad or failure about yourself  3  Trouble concentrating 2  Moving slowly or fidgety/restless 3  Suicidal thoughts 3  PHQ-9 Score 25  Difficult doing work/chores Extremely dIfficult    Health Maintenance Due  Topic Date Due  . COVID-19 Vaccine (1) Never done     ROS: Per HPI, otherwise a complete review of systems was negative.   PMH:  The following were reviewed and entered/updated in epic: Past Medical History:  Diagnosis Date  . Arthritis   . Colon polyp   . Diverticulosis   . GERD (gastroesophageal reflux disease)   . Insomnia   . Retinal micro-aneurysm of right eye   . Scoliosis    Patient Active Problem List   Diagnosis Date Noted  . 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 Pelvic pain 08/10/2019  . Rhinorrhea 02/16/2019  . Sleep-disordered breathing 02/16/2019  . 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  . Osteopenia 05/03/2018  . Allergic rhinitis 04/01/2018  . Insomnia 03/01/2018  . 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 Urinary incontinence 01/18/2018  . LOW BACK PAIN, CHRONIC 12/12/2007  . DIVERTICULOSIS, COLON W/O HEM 08/25/2007  . GERD 04/15/2007   Past Surgical History:  Procedure Laterality Date  . ABDOMINAL HYSTERECTOMY  2002  . COLONOSCOPY      Family History  Problem Relation Age of Onset  . Hypertension Mother   . Diabetes Mother   . Osteoporosis Mother   . Heart Problems Mother   . Thyroid cancer Sister   . Colon cancer Neg Hx       Medications- reviewed and updated Current Outpatient Medications  Medication Sig Dispense Refill  . Multiple Vitamin (MULTIVITAMIN) tablet Take 1 tablet by mouth daily.    . pantoprazole (PROTONIX) 40 MG tablet Take 1 tablet (40 mg total) by mouth 2 (two) times daily before a meal. 180 tablet 3  . QUEtiapine (SEROQUEL) 25 MG tablet Take 2 tablets (50 mg  total) by mouth at bedtime. 180 tablet 3  . diclofenac (VOLTAREN) 75 MG EC tablet Take 1 tablet (75 mg total) by mouth 2 (two) times daily. (Patient not taking: Reported on 04/25/2020) 30 tablet 0  . zolpidem (AMBIEN) 10 MG tablet Take 1 tablet (10 mg total) by mouth at bedtime as needed for sleep. 30 tablet 5   No current facility-administered medications for this visit.    Allergies-reviewed and updated No Known Allergies  Social History   Socioeconomic History  . Marital status: Single    Spouse name: Not on file  . Number of children: Not on file  . Years of education: Not on file  . Highest education level: Not on file  Occupational History  . Not on file  Tobacco Use  . Smoking status: Never Smoker  . Smokeless tobacco: Never Used  Substance and Sexual Activity  . Alcohol use: Yes    Comment: socially  . Drug use: No  . Sexual activity: Not on file  Other Topics Concern  . Not on file  Social History Narrative  . Not on file   Social Determinants of Health   Financial Resource Strain:   . Difficulty of Paying Living Expenses:   Food Insecurity:   . Worried About Charity fundraiser in the Last Year:   . Arboriculturist in the Last Year:   Transportation Needs:   . Film/video editor (Medical):   Marland Kitchen Lack of Transportation (Non-Medical):   Physical Activity:   . Days of Exercise per Week:   . Minutes of Exercise per Session:   Stress:   . Feeling of Stress :   Social Connections:   . Frequency of Communication with Friends and Family:   . Frequency of Social Gatherings with Friends and Family:   . Attends Religious Services:   . Active Member of Clubs or Organizations:   . Attends Archivist Meetings:   Marland Kitchen Marital Status:         Objective:  Physical Exam: BP 120/80 (BP Location: Left Arm, Patient Position: Sitting, Cuff Size: Normal)   Pulse 91   Temp 97.9 F (36.6 C) (Temporal)   Ht 5' 4" (1.626 m)   Wt 167 lb (75.8 kg)   SpO2 97%    BMI 28.67 kg/m   Body mass index is 28.67 kg/m. Wt Readings from Last 3 Encounters:  04/25/20 167 lb (75.8 kg)  12/15/19 168 lb (76.2 kg)  11/15/19 168 lb (76.2 kg)   Gen: NAD, resting comfortably HEENT: TMs normal bilaterally. OP clear. No thyromegaly noted.  CV: RRR with no murmurs appreciated Pulm: NWOB, CTAB with no crackles, wheezes, or rhonchi GI: Normal bowel sounds present. Soft, Nontender, Nondistended. MSK: no edema, cyanosis, or clubbing noted Skin: warm, dry Neuro: CN2-12 grossly intact. Strength 5/5 in upper and lower extremities. Reflexes symmetric and intact bilaterally.  Psych: Normal affect and thought content     Casady Voshell M. [name], MD 04/25/2020 10:22 AM

## 2020-04-25 NOTE — Assessment & Plan Note (Signed)
Continue Seroquel 50 mg nightly and Ambien 10 mg nightly as needed.

## 2020-04-25 NOTE — Patient Instructions (Signed)
It was very nice to see you today!  We will check blood work today.  We will contact you with results of this and your bone density scan next week.  Come back in 1 year for your next physical, or sooner if needed.  Take care, Dr [name]  Please try these tips to maintain a healthy lifestyle:   Eat at least 3 REAL meals and 1-2 snacks per day.  Aim for no more than 5 hours between eating.  If you eat breakfast, please do so within one hour of getting up.    Each meal should contain half fruits/vegetables, one quarter protein, and one quarter carbs (no bigger than a computer mouse)   Cut down on sweet beverages. This includes juice, soda, and sweet tea.     Drink at least 1 glass of water with each meal and aim for at least 8 glasses per day   Exercise at least 150 minutes every week.    Preventive Care 15 Years and Older, Female Preventive care refers to lifestyle choices and visits with your health care provider that can promote health and wellness. This includes:  A yearly physical exam. This is also called an annual well check.  Regular dental and eye exams.  Immunizations.  Screening for certain conditions.  Healthy lifestyle choices, such as diet and exercise. What can I expect for my preventive care visit? Physical exam Your health care provider will check:  Height and weight. These may be used to calculate body mass index (BMI), which is a measurement that tells if you are at a healthy weight.  Heart rate and blood pressure.  Your skin for abnormal spots. Counseling Your health care provider may ask you questions about:  Alcohol, tobacco, and drug use.  Emotional well-being.  Home and relationship well-being.  Sexual activity.  Eating habits.  History of falls.  Memory and ability to understand (cognition).  Work and work Statistician.  Pregnancy and menstrual history. What immunizations do I need?  Influenza (flu) vaccine  This is  recommended every year. Tetanus, diphtheria, and pertussis (Tdap) vaccine  You may need a Td booster every 10 years. Varicella (chickenpox) vaccine  You may need this vaccine if you have not already been vaccinated. Zoster (shingles) vaccine  You may need this after age 29. Pneumococcal conjugate (PCV13) vaccine  One dose is recommended after age 71. Pneumococcal polysaccharide (PPSV23) vaccine  One dose is recommended after age 54. Measles, mumps, and rubella (MMR) vaccine  You may need at least one dose of MMR if you were born in 1957 or later. You may also need a second dose. Meningococcal conjugate (MenACWY) vaccine  You may need this if you have certain conditions. Hepatitis A vaccine  You may need this if you have certain conditions or if you travel or work in places where you may be exposed to hepatitis A. Hepatitis B vaccine  You may need this if you have certain conditions or if you travel or work in places where you may be exposed to hepatitis B. Haemophilus influenzae type b (Hib) vaccine  You may need this if you have certain conditions. You may receive vaccines as individual doses or as more than one vaccine together in one shot (combination vaccines). Talk with your health care provider about the risks and benefits of combination vaccines. What tests do I need? Blood tests  Lipid and cholesterol levels. These may be checked every 5 years, or more frequently depending on your overall health.  Hepatitis C test.  Hepatitis B test. Screening  Lung cancer screening. You may have this screening every year starting at age 16 if you have a 30-pack-year history of smoking and currently smoke or have quit within the past 15 years.  Colorectal cancer screening. All adults should have this screening starting at age 64 and continuing until age 57. Your health care provider may recommend screening at age 80 if you are at increased risk. You will have tests every 1-10  years, depending on your results and the type of screening test.  Diabetes screening. This is done by checking your blood sugar (glucose) after you have not eaten for a while (fasting). You may have this done every 1-3 years.  Mammogram. This may be done every 1-2 years. Talk with your health care provider about how often you should have regular mammograms.  BRCA-related cancer screening. This may be done if you have a family history of breast, ovarian, tubal, or peritoneal cancers. Other tests  Sexually transmitted disease (STD) testing.  Bone density scan. This is done to screen for osteoporosis. You may have this done starting at age 79. Follow these instructions at home: Eating and drinking  Eat a diet that includes fresh fruits and vegetables, whole grains, lean protein, and low-fat dairy products. Limit your intake of foods with high amounts of sugar, saturated fats, and salt.  Take vitamin and mineral supplements as recommended by your health care provider.  Do not drink alcohol if your health care provider tells you not to drink.  If you drink alcohol: ? Limit how much you have to 0-1 drink a day. ? Be aware of how much alcohol is in your drink. In the [place] one drink equals one 12 oz bottle of beer (355 mL), one 5 oz glass of wine (148 mL), or one 1 oz glass of hard liquor (44 mL). Lifestyle  Take daily care of your teeth and gums.  Stay active. Exercise for at least 30 minutes on 5 or more days each week.  Do not use any products that contain nicotine or tobacco, such as cigarettes, e-cigarettes, and chewing tobacco. If you need help quitting, ask your health care provider.  If you are sexually active, practice safe sex. Use a condom or other form of protection in order to prevent STIs (sexually transmitted infections).  Talk with your health care provider about taking a low-dose aspirin or statin. What's next?  Go to your health care provider once a year for a well  check visit.  Ask your health care provider how often you should have your eyes and teeth checked.  Stay up to date on all vaccines. This information is not intended to replace advice given to you by your health care provider. Make sure you discuss any questions you have with your health care provider. Document Revised: 11/17/2018 Document Reviewed: 11/17/2018 Elsevier Patient Education  2020 [employer].

## 2020-04-25 NOTE — Assessment & Plan Note (Signed)
No current symptoms.  Recent carotid Doppler within the past few years is normal.  Continue with watchful waiting.

## 2020-04-29 ENCOUNTER — Ambulatory Visit (INDEPENDENT_AMBULATORY_CARE_PROVIDER_SITE_OTHER)
Admission: RE | Admit: 2020-04-29 | Discharge: 2020-04-29 | Disposition: A | Discharge status: Home / Self Care | Payer: Medicare HMO | Source: Ambulatory Visit | Attending: Family Medicine | Admitting: Family Medicine

## 2020-04-29 ENCOUNTER — Other Ambulatory Visit: Payer: Self-pay

## 2020-04-29 DIAGNOSIS — M8580 Other specified disorders of bone density and structure, unspecified site: Secondary | ICD-10-CM

## 2020-04-30 ENCOUNTER — Encounter: Payer: Self-pay | Admitting: Family Medicine

## 2020-04-30 ENCOUNTER — Telehealth: Payer: Self-pay | Admitting: Family Medicine

## 2020-04-30 DIAGNOSIS — E785 Hyperlipidemia, unspecified: Secondary | ICD-10-CM | POA: Insufficient documentation

## 2020-04-30 DIAGNOSIS — R739 Hyperglycemia, unspecified: Secondary | ICD-10-CM | POA: Insufficient documentation

## 2020-04-30 NOTE — Telephone Encounter (Signed)
Pt called asking if Dr. [name] would order her a cologuard test. She also asked if Dr. [name] thought it would be a good idea for her to take water pills. Please advise.

## 2020-04-30 NOTE — Progress Notes (Signed)
Please inform patient of the following:  Her bone density scan shows relatively stable osteopenia.  She does not have osteoporosis.  Would like for her to continue calcium and vitamin D.  We should repeat in 2 years.

## 2020-04-30 NOTE — Telephone Encounter (Signed)
Please advise 

## 2020-04-30 NOTE — Progress Notes (Signed)
Please inform patient of the following:  Cholesterol and blood sugar levels are borderline.  Do not need to start medications.  She should continue working on diet and exercise and we can recheck in year.  All of her other labs are stable.  We can mail a hard copy of her labs and bone density scan to her house if she wishes.

## 2020-05-01 ENCOUNTER — Other Ambulatory Visit: Payer: Self-pay | Admitting: *Deleted

## 2020-05-01 DIAGNOSIS — Z0000 Encounter for general adult medical examination without abnormal findings: Secondary | ICD-10-CM

## 2020-05-01 NOTE — Telephone Encounter (Signed)
Ok with ordering cologuard.   Would not recommend water pills right now as they can lower her blood pressure too low and they can cause issues with dehydration.  Algis Greenhouse. [name], MD 05/01/2020 8:06 AM

## 2020-05-01 NOTE — Progress Notes (Signed)
colorgard

## 2020-05-01 NOTE — Telephone Encounter (Signed)
Pt notified cologuard order  Dr [name] not recommended water pills, my lower BP can cause issues with dehydration  Pt verbalized understanding

## 2020-05-02 ENCOUNTER — Telehealth: Payer: Self-pay | Admitting: Family Medicine

## 2020-05-02 ENCOUNTER — Other Ambulatory Visit: Payer: Self-pay

## 2020-05-02 DIAGNOSIS — Z1212 Encounter for screening for malignant neoplasm of rectum: Secondary | ICD-10-CM

## 2020-05-02 NOTE — Telephone Encounter (Signed)
Patient is calling to see if she if her Voltaren tablets are pain pills, she hurt her back. Please adviser

## 2020-05-03 NOTE — Telephone Encounter (Signed)
Spoke with patient and notified her that voltaren will help with any inflammation and should relieve pain.

## 2020-05-07 ENCOUNTER — Telehealth: Payer: Self-pay | Admitting: Family Medicine

## 2020-05-07 NOTE — Telephone Encounter (Signed)
Patient is calling in asking if someone could call her about her lab results.

## 2020-05-08 NOTE — Telephone Encounter (Signed)
Called patient, disconnected. Called back with no answer

## 2020-05-08 NOTE — Telephone Encounter (Signed)
Date of birth verified by patient  Lab results given,Pt verbalized understanding  Call office if any question   

## 2020-05-09 DIAGNOSIS — L239 Allergic contact dermatitis, unspecified cause: Secondary | ICD-10-CM | POA: Diagnosis not present

## 2020-05-09 DIAGNOSIS — H00025 Hordeolum internum left lower eyelid: Secondary | ICD-10-CM | POA: Diagnosis not present

## 2020-05-14 ENCOUNTER — Telehealth: Payer: Self-pay | Admitting: Family Medicine

## 2020-05-14 NOTE — Telephone Encounter (Signed)
Pt called asking if she could be mailed a hard copy of her bone density results. Please advise.

## 2020-05-22 ENCOUNTER — Telehealth: Payer: Self-pay | Admitting: Family Medicine

## 2020-05-22 NOTE — Telephone Encounter (Signed)
Given results to pt, Pt verbalized understanding  Stated need copy for her to take it to PT and copy was mail by "the other girl"

## 2020-05-22 NOTE — Telephone Encounter (Signed)
Patient called in and stated that no one has called her about her lab results from her  CPE.

## 2020-06-11 ENCOUNTER — Telehealth (INDEPENDENT_AMBULATORY_CARE_PROVIDER_SITE_OTHER): Payer: Medicare HMO | Admitting: Family Medicine

## 2020-06-11 DIAGNOSIS — G4700 Insomnia, unspecified: Secondary | ICD-10-CM | POA: Diagnosis not present

## 2020-06-11 DIAGNOSIS — F439 Reaction to severe stress, unspecified: Secondary | ICD-10-CM | POA: Diagnosis not present

## 2020-06-11 NOTE — Progress Notes (Signed)
   Lindsay Reyes is a 69 y.o. female who presents today for a telephone visit.  Assessment/Plan:  Chronic Problems Addressed Today: Stress No red flags.  Will place referral for therapy per patient request.  Insomnia Stable.  We will continue Ambien 10 mg nightly.     Subjective:  HPI:  Patient is seen to be referred to therapist.  She has been having ongoing issues of feeling empty and dark for the past few years.  Symptoms have become to the point where she is no longer able to manage.  She has never seen a therapist in the past.  No reported SI or HI.       Objective/Observations   NAD  Telephone Visit   I connected with Lindsay Reyes on 06/11/20 at  4:00 PM EDT via telephone and verified that I am speaking with the correct person using two identifiers. I discussed the limitations of evaluation and management by telemedicine and the availability of in person appointments. The patient expressed understanding and agreed to proceed.   Patient location: Home Provider location: [place] participating in the virtual visit: Myself and patient  A total of 11 minutes were spent on medical discussion.      Lindsay Reyes. Lindsay Pain, MD 06/11/2020 11:14 AM

## 2020-06-11 NOTE — Assessment & Plan Note (Signed)
No red flags.  Will place referral for therapy per patient request.

## 2020-06-11 NOTE — Assessment & Plan Note (Signed)
Stable.  We will continue Ambien 10 mg nightly.

## 2020-07-01 ENCOUNTER — Other Ambulatory Visit: Payer: Self-pay | Admitting: Family Medicine

## 2020-07-02 NOTE — Telephone Encounter (Signed)
Last refill: 01/15/20 #30, 5 Last OV: 06/11/20 dx. Insomnia

## 2020-07-03 ENCOUNTER — Telehealth: Payer: Self-pay | Admitting: Family Medicine

## 2020-07-03 ENCOUNTER — Encounter: Payer: Self-pay | Admitting: Family Medicine

## 2020-07-03 ENCOUNTER — Other Ambulatory Visit: Payer: Self-pay

## 2020-07-03 ENCOUNTER — Ambulatory Visit: Payer: Medicare HMO | Admitting: Family Medicine

## 2020-07-03 DIAGNOSIS — F439 Reaction to severe stress, unspecified: Secondary | ICD-10-CM

## 2020-07-03 DIAGNOSIS — G4700 Insomnia, unspecified: Secondary | ICD-10-CM

## 2020-07-03 DIAGNOSIS — M19012 Primary osteoarthritis, left shoulder: Secondary | ICD-10-CM | POA: Diagnosis not present

## 2020-07-03 NOTE — Telephone Encounter (Signed)
Patient is following up on referral  Per chart - Prefer Lisa in office. Can also refer to Dr Zella Ball if Lattie Haw is booked out several weeks.

## 2020-07-03 NOTE — Progress Notes (Signed)
Lindsay Reyes - 69 y.o. female MRN [id number]  Date of birth: 10-Aug-1951  SUBJECTIVE:  Including CC & ROS.  Chief Complaint  Patient presents with  . Shoulder Pain    left    Lindsay Reyes is a 69 y.o. female that is presenting with acute on chronic left shoulder pain.  She has noticed more popping in the shoulder than anything else.  The pain is intermittent in nature.  She tends to swim on a regular basis.  She denies any significant pain during swelling but does have pain afterwards.  Has received injections in the past..  Independent review of the left shoulder x-ray from 2019 shows moderate glenohumeral joint degenerative changes.   Review of Systems See HPI   HISTORY: Past Medical, Surgical, Social, and Family History Reviewed & Updated per EMR.   Pertinent Historical Findings include:  Past Medical History:  Diagnosis Date  . Arthritis   . Colon polyp   . Diverticulosis   . GERD (gastroesophageal reflux disease)   . Insomnia   . Retinal micro-aneurysm of right eye   . Scoliosis     Past Surgical History:  Procedure Laterality Date  . ABDOMINAL HYSTERECTOMY  2002  . COLONOSCOPY      Family History  Problem Relation Age of Onset  . Hypertension Mother   . Diabetes Mother   . Osteoporosis Mother   . Heart Problems Mother   . Thyroid cancer Sister   . Colon cancer Neg Hx     Social History   Socioeconomic History  . Marital status: Single    Spouse name: Not on file  . Number of children: Not on file  . Years of education: Not on file  . Highest education level: Not on file  Occupational History  . Not on file  Tobacco Use  . Smoking status: Never Smoker  . Smokeless tobacco: Never Used  Vaping Use  . Vaping Use: Never used  Substance and Sexual Activity  . Alcohol use: Yes    Comment: socially  . Drug use: No  . Sexual activity: Not on file  Other Topics Concern  . Not on file  Social History Narrative  . Not on file   Social Determinants of  Health   Financial Resource Strain:   . Difficulty of Paying Living Expenses:   Food Insecurity:   . Worried About Charity fundraiser in the Last Year:   . Arboriculturist in the Last Year:   Transportation Needs:   . Film/video editor (Medical):   Marland Kitchen Lack of Transportation (Non-Medical):   Physical Activity:   . Days of Exercise per Week:   . Minutes of Exercise per Session:   Stress:   . Feeling of Stress :   Social Connections:   . Frequency of Communication with Friends and Family:   . Frequency of Social Gatherings with Friends and Family:   . Attends Religious Services:   . Active Member of Clubs or Organizations:   . Attends Archivist Meetings:   Marland Kitchen Marital Status:   Intimate Partner Violence:   . Fear of Current or Ex-Partner:   . Emotionally Abused:   Marland Kitchen Physically Abused:   . Sexually Abused:      PHYSICAL EXAM:  VS: BP (!) 130/86   Ht 5\' 4"  (1.626 m)   Wt 160 lb (72.6 kg)   BMI 27.46 kg/m  Physical Exam Gen: NAD, alert, cooperative with exam, well-appearing MSK:  Left  shoulder: Normal active flexion and abduction. Normal external rotation. Normal empty can testing. Negative speeds test. 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  Has popping from time to time. -Counseled on home exercise therapy and supportive care. -Try Voltaren. -Could consider glenohumeral injection

## 2020-07-03 NOTE — Assessment & Plan Note (Signed)
Acute on chronic in nature.  Has popping from time to time. -Counseled on home exercise therapy and supportive care. -Try Voltaren. -Could consider glenohumeral injection

## 2020-07-03 NOTE — Patient Instructions (Signed)
Nice to meet you Please try heat before the exercises and ice after  Please try the exercises  Please try voltaren   Please send me a message in MyChart with any questions or updates.  Please see me back in 4 weeks.   --Dr. Raeford Razor

## 2020-07-04 NOTE — Telephone Encounter (Signed)
Spoke to pt told her I placed a new referral for another Psychologist being as Lattie Haw is booked out. Someone will contact you to schedule an appt. Pt verbalized understanding.

## 2020-08-26 ENCOUNTER — Encounter: Payer: Self-pay | Admitting: Family Medicine

## 2020-08-26 ENCOUNTER — Telehealth (INDEPENDENT_AMBULATORY_CARE_PROVIDER_SITE_OTHER): Payer: Medicare HMO | Admitting: Family Medicine

## 2020-08-26 VITALS — Ht 64.0 in | Wt 160.0 lb

## 2020-08-26 DIAGNOSIS — G4700 Insomnia, unspecified: Secondary | ICD-10-CM | POA: Diagnosis not present

## 2020-08-26 DIAGNOSIS — G259 Extrapyramidal and movement disorder, unspecified: Secondary | ICD-10-CM

## 2020-08-26 MED ORDER — AUSTEDO 6 MG PO TABS: 6.0000 mg | ORAL_TABLET | Freq: Two times a day (BID) | ORAL | 5 refills | Status: DC

## 2020-08-26 NOTE — Assessment & Plan Note (Signed)
Difficult to assess over the phone though does seem to be consistent with tardive dyskinesia.  Discussed stopping Seroquel though patient does not want to do this at this time.  She is aware that this could be contributing to her symptoms.  We will start Austedo per her request.  May need referral to neurology if insurance does not cover or if symptoms are not improving.

## 2020-08-26 NOTE — Progress Notes (Signed)
   Lindsay Reyes is a 69 y.o. female who presents today for a telephone visit.  Assessment/Plan:  Chronic Problems Addressed Today: Movement disorder Difficult to assess over the phone though does seem to be consistent with tardive dyskinesia.  Discussed stopping Seroquel though patient does not want to do this at this time.  She is aware that this could be contributing to her symptoms.  We will start Austedo per her request.  May need referral to neurology if insurance does not cover or if symptoms are not improving.  Insomnia Overall stable.  Continue Ambien 10 mg nightly and Seroquel as above.  Discussed with patient stopping Seroquel due to her movement disorder however she declined.     Subjective:  HPI:  Patient has worsening pressure to the bridge of her nose for the past few months.  Symptoms started several years ago but have worsened within last few months.  No obvious reason why symptoms have worsened.  She is concerned about possible tardive dyskinesia and would like to try medication for this.  She does not want to change dose of Seroquel.  Currently feels like medications are working very well for her insomnia.       Objective/Observations   NAD  Telephone Visit   I connected with Lindsay Reyes on 08/26/20 at  1:00 PM EDT via telephone and verified that I am speaking with the correct person using two identifiers. I discussed the limitations of evaluation and management by telemedicine and the availability of in person appointments. The patient expressed understanding and agreed to proceed.   Patient location: Home Provider location: [place] participating in the virtual visit: Myself and patient  A total of 7 minutes were spent on medical discussion.      Algis Greenhouse. [name], MD 08/26/2020 10:43 AM

## 2020-08-26 NOTE — Assessment & Plan Note (Signed)
Overall stable.  Continue Ambien 10 mg nightly and Seroquel as above.  Discussed with patient stopping Seroquel due to her movement disorder however she declined.

## 2020-09-02 ENCOUNTER — Telehealth: Payer: Self-pay

## 2020-09-02 NOTE — Telephone Encounter (Signed)
Pt called about the Austedo she was put on. The Prescription was transferred to another store. Pt called number that they gave her and no one has reached out to her. Pt wants Dr. [name] to know she has not been able to start on this medication.

## 2020-09-04 ENCOUNTER — Other Ambulatory Visit: Payer: Self-pay

## 2020-09-04 MED ORDER — INGREZZA 40 MG PO CAPS: 40.0000 mg | ORAL_CAPSULE | Freq: Every day | ORAL | 5 refills | Status: DC

## 2020-09-04 NOTE — Telephone Encounter (Signed)
Ok to send in [place] 40mg  daily.  Algis Greenhouse. [name], MD 09/04/2020 12:10 PM

## 2020-09-04 NOTE — Telephone Encounter (Signed)
Pt requesting change on Rx since it been about two week, insurance has not responded

## 2020-09-04 NOTE — Telephone Encounter (Signed)
Rx sent to pharmacy, called and made pt aware. 

## 2020-09-06 ENCOUNTER — Telehealth: Payer: Self-pay

## 2020-09-06 NOTE — Telephone Encounter (Signed)
Pharmacy called stating that they need prior auth for Austedo medication.   [id number] - insurance company

## 2020-09-09 DIAGNOSIS — F331 Major depressive disorder, recurrent, moderate: Secondary | ICD-10-CM | POA: Diagnosis not present

## 2020-09-11 ENCOUNTER — Telehealth: Payer: Self-pay | Admitting: *Deleted

## 2020-09-11 NOTE — Telephone Encounter (Signed)
Pa  For Rx Ingrezza 40mg  send to [employer] for Determination

## 2020-09-11 NOTE — Telephone Encounter (Signed)
PA request has been denied.  appeal send

## 2020-09-11 NOTE — Telephone Encounter (Signed)
Pt requesting change of medication  Per patient, Something insurance will pay

## 2020-09-12 MED ORDER — TETRABENAZINE 12.5 MG PO TABS: 12.5000 mg | ORAL_TABLET | Freq: Two times a day (BID) | ORAL | 5 refills | Status: DC

## 2020-09-12 NOTE — Telephone Encounter (Signed)
Per PA message Rx tetrabenazine tablet is one of the preferred medication

## 2020-09-12 NOTE — Telephone Encounter (Signed)
I have no way of knowing what her insurance will cover - did her PA give [place] any formulary alternatives?  Algis Greenhouse. [name], MD 09/12/2020 8:10 AM

## 2020-09-16 ENCOUNTER — Other Ambulatory Visit: Payer: Self-pay | Admitting: Family Medicine

## 2020-10-04 ENCOUNTER — Telehealth: Payer: Self-pay | Admitting: *Deleted

## 2020-10-04 NOTE — Telephone Encounter (Signed)
Rx Ingrezza 40MG  capsulesPA Case: [id number], Status: Approved, Coverage Starts on: 12/08/2019 12:00:00 AM, Coverage Ends on: 12/06/2021 12:00:00 AM. Pharmacy notified

## 2020-10-10 ENCOUNTER — Telehealth (INDEPENDENT_AMBULATORY_CARE_PROVIDER_SITE_OTHER): Payer: Medicare HMO | Admitting: Family Medicine

## 2020-10-10 ENCOUNTER — Encounter: Payer: Self-pay | Admitting: Family Medicine

## 2020-10-10 VITALS — Ht 64.0 in | Wt 155.0 lb

## 2020-10-10 DIAGNOSIS — G259 Extrapyramidal and movement disorder, unspecified: Secondary | ICD-10-CM | POA: Diagnosis not present

## 2020-10-10 DIAGNOSIS — M8580 Other specified disorders of bone density and structure, unspecified site: Secondary | ICD-10-CM

## 2020-10-10 MED ORDER — INGREZZA 40 MG PO CAPS: 40.0000 mg | ORAL_CAPSULE | Freq: Every day | ORAL | 5 refills | Status: DC

## 2020-10-10 NOTE — Progress Notes (Signed)
   Lindsay Reyes is a 69 y.o. female who presents today for a telephone visit.  Assessment/Plan:  Chronic Problems Addressed Today: Movement disorder Insurance is approved Ingrezza.  Will resend into her pharmacy.  She will take 40 mg daily.  She would like to try for a few weeks before deciding if this should be long-term or not.  She does not wish to stop Seroquel at this time.  Osteopenia Reviewed recent bone density scan.  Overall improved since last time.  She will continue calcium and vitamin D supplementation.     Subjective:  HPI:  See A/p.         Objective/Observations   NAD  Telephone Visit   I connected with Lindsay Reyes on 10/10/20 at  3:40 PM EDT via telephone and verified that I am speaking with the correct person using two identifiers. I discussed the limitations of evaluation and management by telemedicine and the availability of in person appointments. The patient expressed understanding and agreed to proceed.   Patient location: Home Provider location: [place] participating in the virtual visit: Myself and Patient  A total of 8 minutes were spent on medical discussion.      Algis Greenhouse. [name], MD 10/10/2020 9:27 AM

## 2020-10-10 NOTE — Assessment & Plan Note (Signed)
Reviewed recent bone density scan.  Overall improved since last time.  She will continue calcium and vitamin D supplementation.

## 2020-10-10 NOTE — Assessment & Plan Note (Addendum)
Insurance is approved [place].  Will resend into her pharmacy.  She will take 40 mg daily.  She would like to try for a few weeks before deciding if this should be long-term or not.  She does not wish to stop Seroquel at this time.

## 2020-10-11 ENCOUNTER — Telehealth: Payer: Self-pay

## 2020-10-11 NOTE — Telephone Encounter (Signed)
We have tried the three medications in that class and that is the only one insurance would partially cover. At this point would consider neurology referral to see if there is anything else they can offer.   Algis Greenhouse. [name], MD 10/11/2020 2:53 PM

## 2020-10-11 NOTE — Telephone Encounter (Signed)
Patient refuse referral

## 2020-10-11 NOTE — Telephone Encounter (Signed)
Pt stated Rx Ingrezza prise is to high for her Requesting changes for this medication

## 2020-10-11 NOTE — Telephone Encounter (Signed)
Patient called in and stated that Lindsay Reyes was $7000 and for her out of pocket was going to be $900. She was offered a grant but she was denied so she just wanted to let Lindsay Reyes know not worry about since she can not afford $900.00.

## 2020-11-05 DIAGNOSIS — F331 Major depressive disorder, recurrent, moderate: Secondary | ICD-10-CM | POA: Diagnosis not present

## 2020-11-05 DIAGNOSIS — H524 Presbyopia: Secondary | ICD-10-CM | POA: Diagnosis not present

## 2020-11-26 ENCOUNTER — Emergency Department (HOSPITAL_BASED_OUTPATIENT_CLINIC_OR_DEPARTMENT_OTHER)
Admission: EM | Admit: 2020-11-26 | Discharge: 2020-11-26 | Disposition: A | Discharge status: Home / Self Care | Payer: Medicare HMO | Attending: Emergency Medicine | Admitting: Emergency Medicine

## 2020-11-26 ENCOUNTER — Encounter (HOSPITAL_BASED_OUTPATIENT_CLINIC_OR_DEPARTMENT_OTHER): Payer: Self-pay

## 2020-11-26 ENCOUNTER — Other Ambulatory Visit: Payer: Self-pay

## 2020-11-26 ENCOUNTER — Emergency Department (HOSPITAL_BASED_OUTPATIENT_CLINIC_OR_DEPARTMENT_OTHER): Payer: Medicare HMO

## 2020-11-26 DIAGNOSIS — R079 Chest pain, unspecified: Secondary | ICD-10-CM | POA: Diagnosis not present

## 2020-11-26 DIAGNOSIS — R0602 Shortness of breath: Secondary | ICD-10-CM | POA: Insufficient documentation

## 2020-11-26 DIAGNOSIS — R0789 Other chest pain: Secondary | ICD-10-CM

## 2020-11-26 LAB — CBC
HCT: 43.6 % (ref 36.0–46.0)
Hemoglobin: 14.5 g/dL (ref 12.0–15.0)
MCH: 29.3 pg (ref 26.0–34.0)
MCHC: 33.3 g/dL (ref 30.0–36.0)
MCV: 88.1 fL (ref 80.0–100.0)
Platelets: 174 10*3/uL (ref 150–400)
RBC: 4.95 MIL/uL (ref 3.87–5.11)
RDW: 12.8 % (ref 11.5–15.5)
WBC: 4.4 10*3/uL (ref 4.0–10.5)
nRBC: 0 % (ref 0.0–0.2)

## 2020-11-26 LAB — BASIC METABOLIC PANEL
Anion gap: 9 (ref 5–15)
BUN: 18 mg/dL (ref 8–23)
CO2: 20 mmol/L — ABNORMAL LOW (ref 22–32)
Calcium: 9.9 mg/dL (ref 8.9–10.3)
Chloride: 107 mmol/L (ref 98–111)
Creatinine, Ser: 0.85 mg/dL (ref 0.44–1.00)
GFR, Estimated: >60 mL/min (ref >60)
Glucose, Bld: 106 mg/dL — ABNORMAL HIGH (ref 70–99)
Potassium: 4.3 mmol/L (ref 3.5–5.1)
Sodium: 136 mmol/L (ref 135–145)

## 2020-11-26 LAB — TROPONIN I (HIGH SENSITIVITY)
Troponin I (High Sensitivity): <2 ng/L (ref <18)
Troponin I (High Sensitivity): <2 ng/L (ref <18)

## 2020-11-26 NOTE — ED Notes (Signed)
Pharmacy and medications updated with patient 

## 2020-11-26 NOTE — Discharge Instructions (Signed)
Your heart enzyme tests today is normal.  If you have persistent chest pain when you walk you will need to get a stress test with a cardiologist.  Return to ER if you have worse chest pain, trouble breathing

## 2020-11-26 NOTE — ED Triage Notes (Addendum)
Pt c/o central CP x 30 min-denies fever/flu sx-NAD-steady gait-later added "sinus infection" 4 days ago

## 2020-11-26 NOTE — ED Notes (Signed)
Pt ambulatory with steady gait to room, no s/s of distress, pt denies shob, endorses decreased pain, reports pain increases with deep inspiration

## 2020-11-26 NOTE — ED Provider Notes (Signed)
[facility] EMERGENCY DEPARTMENT Provider Note   CSN: [id number] Arrival date & time: 11/26/20  1511     History Chief Complaint  Patient presents with  . Chest Pain    Lindsay Reyes is a 69 y.o. female history of diverticulosis, scoliosis, here presenting with chest pain.  Patient states that she walked to the mailbox today and then got home and had some chest pressure.  Some associated shortness of breath as well.  Denies any fevers or chills.  Denies any underlying heart problems or history of blood clots.  She denies any Covid exposures.   The history is provided by the patient.       Past Medical History:  Diagnosis Date  . Arthritis   . Colon polyp   . Diverticulosis   . GERD (gastroesophageal reflux disease)   . Insomnia   . Retinal micro-aneurysm of right eye   . Scoliosis     Patient Active Problem List   Diagnosis Date Noted  . Movement disorder 08/26/2020  . Stress 06/11/2020  . Dyslipidemia 04/30/2020  . Hyperglycemia 04/30/2020  . 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 Pelvic pain 08/10/2019  . Rhinorrhea 02/16/2019  . Sleep-disordered breathing 02/16/2019  . 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  . Osteopenia 05/03/2018  . Allergic rhinitis 04/01/2018  . Insomnia 03/01/2018  . 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 Urinary incontinence 01/18/2018  . LOW BACK PAIN, CHRONIC 12/12/2007  . DIVERTICULOSIS, COLON W/O HEM 08/25/2007  . GERD 04/15/2007    Past Surgical History:  Procedure Laterality Date  . ABDOMINAL HYSTERECTOMY  2002  . COLONOSCOPY       OB History   No obstetric history on file.     Family History  Problem Relation Age of Onset  . Hypertension Mother   . Diabetes Mother   . Osteoporosis Mother   . Heart Problems Mother   . Thyroid cancer Sister   . Colon cancer Neg Hx     Social History   Tobacco Use  . Smoking status: Never Smoker  . Smokeless tobacco: Never Used  Vaping Use  . Vaping Use: Never used   Substance Use Topics  . Alcohol use: Yes    Comment: socially  . Drug use: No    Home Medications Prior to Admission medications   Medication Sig Start Date End Date Taking? Authorizing Provider  diclofenac (VOLTAREN) 75 MG EC tablet Take 1 tablet (75 mg total) by mouth 2 (two) times daily. 08/02/19   [name], MD  Multiple Vitamin (MULTIVITAMIN) tablet Take 1 tablet by mouth daily.    [provider]  pantoprazole (PROTONIX) 40 MG tablet Take 1 tablet (40 mg total) by mouth 2 (two) times daily before a meal. 12/15/19   Ladene Artist, MD  QUEtiapine (SEROQUEL) 25 MG tablet TAKE 2 TABLETS (50 MG TOTAL) BY MOUTH AT BEDTIME. 09/16/20   [name], MD  Valbenazine Tosylate [facility]) 40 MG CAPS Take 1 capsule (40 mg total) by mouth daily. 10/10/20   [name], MD  zolpidem (AMBIEN) 10 MG tablet TAKE 1 TABLET BY MOUTH AT BEDTIME AS NEEDED FOR SLEEP. 07/02/20   [name], MD    Allergies    Patient has no known allergies.  Review of Systems   Review of Systems  Cardiovascular: Positive for chest pain.  All other systems reviewed and are negative.   Physical Exam Updated Vital Signs BP (!) 144/104 (BP Location: Left Arm)   Pulse (!) 104  Temp 97.8 F (36.6 C) (Oral)   Resp 20   Ht 5\' 4"  (1.626 m)   Wt 70.8 kg   SpO2 100%   BMI 26.78 kg/m   Physical Exam Vitals and nursing note reviewed.  Constitutional:      Appearance: She is well-developed.  HENT:     Head: Normocephalic.  Eyes:     Extraocular Movements: Extraocular movements intact.     Pupils: Pupils are equal, round, and reactive to light.  Cardiovascular:     Rate and Rhythm: Normal rate and regular rhythm.     Heart sounds: Normal heart sounds.  Pulmonary:     Effort: Pulmonary effort is normal.     Breath sounds: Normal breath sounds.  Abdominal:     General: Bowel sounds are normal.     Palpations: Abdomen is soft.  Musculoskeletal:        General: Normal range of motion.      Cervical back: Normal range of motion and neck supple.  Skin:    General: Skin is warm.     Capillary Refill: Capillary refill takes less than 2 seconds.  Neurological:     General: No focal deficit present.     Mental Status: She is alert and oriented to person, place, and time.  Psychiatric:        Mood and Affect: Mood normal.        Behavior: Behavior normal.     ED Results / Procedures / Treatments   Labs (all labs ordered are listed, but only abnormal results are displayed) Labs Reviewed  BASIC METABOLIC PANEL - Abnormal; Notable for the following components:      Result Value   CO2 20 (*)    Glucose, Bld 106 (*)    All other components within normal limits  CBC  TROPONIN I (HIGH SENSITIVITY)    EKG EKG Interpretation  Date/Time:  Tuesday November 26 2020 15:11:53 EST Ventricular Rate:  103 PR Interval:  164 QRS Duration: 82 QT Interval:  324 QTC Calculation: 424 R Axis:   58 Text Interpretation: Sinus tachycardia Otherwise normal ECG No significant change since last tracing Confirmed by Wandra Arthurs [phone]) on 11/26/2020 4:15:30 PM   Radiology DG Chest 2 View  Result Date: 11/26/2020 CLINICAL DATA:  Central chest pain for 30 minutes EXAM: CHEST - 2 VIEW COMPARISON:  09/10/2013 FINDINGS: Frontal and lateral views of the chest demonstrate an unremarkable cardiac silhouette. No airspace disease, effusion, or pneumothorax. There are no acute bony abnormalities. Right convex thoracolumbar scoliosis unchanged. IMPRESSION: 1. No acute intrathoracic process. Electronically Signed   By: Randa Ngo M.D.   On: 11/26/2020 15:38    Procedures Procedures (including critical care time)  Medications Ordered in ED Medications - No data to display  ED Course  I have reviewed the triage vital signs and the nursing notes.  Pertinent labs & imaging results that were available during my care of the patient were reviewed by me and considered in my medical decision  making (see chart for details).    MDM Rules/Calculators/A&P                          Lindsay Reyes is a 69 y.o. female here with chest pain.  Patient has chest pain after she walked to the mailbox.  Patient has no underlying CAD.  Her last echo was in 2016 and was unremarkable.  She never saw a cardiologist or had a  stress test.  Low risk for ACS.  Patient was initially slightly tachycardic but no longer tachycardic.  Has no PE risk factors.  Plan to get CBC, bmp, troponin x2, chest x-ray.  6:35 PM Labs showed troponin negative x2. Patient remains pain-free. I told her that if she has persistent chest pain with exertion, she will need a stress test. Will refer to cardiology outpatient.  Final Clinical Impression(s) / ED Diagnoses Final diagnoses:  None    Rx / DC Orders ED Discharge Orders    None       Drenda Freeze, MD 11/26/20 1836

## 2021-01-10 ENCOUNTER — Other Ambulatory Visit: Payer: Self-pay | Admitting: Family Medicine

## 2021-01-10 NOTE — Telephone Encounter (Signed)
Ambien last rx 07/02/20 #30 5 RF LOV: 10/10/20 Osteopenia

## 2021-01-31 ENCOUNTER — Telehealth: Payer: Self-pay

## 2021-01-31 NOTE — Telephone Encounter (Signed)
Patient called in stating she would like to schedule a telephone call appointment to discuss goat issues in her R toe. Asked patient if she would be able to come in the office, but Lindsay Reyes stated that she did not want to come in because we are still requiring a mask.   Spoke with Lindsay Reyes to see if we would be able to see if we a telehealth appointment, to which she explained it would need to be in office, I spoke with patient and explained that we would need to have her come in for an appointment. Lindsay Reyes states she will call at a later date to be seen.

## 2021-02-10 ENCOUNTER — Other Ambulatory Visit: Payer: Self-pay | Admitting: Gastroenterology

## 2021-02-13 ENCOUNTER — Ambulatory Visit (INDEPENDENT_AMBULATORY_CARE_PROVIDER_SITE_OTHER): Payer: Medicare HMO

## 2021-02-13 DIAGNOSIS — Z0000 Encounter for general adult medical examination without abnormal findings: Secondary | ICD-10-CM | POA: Diagnosis not present

## 2021-02-13 NOTE — Progress Notes (Signed)
Virtual Visit via Telephone Note  I connected with  Lindsay Reyes on 02/13/21 at 11:45 AM EST by telephone and verified that I am speaking with the correct person using two identifiers.  Medicare Annual Wellness visit completed telephonically due to Covid-19 pandemic.   Persons participating in this call: This Health Coach and this patient.   Location: Patient: home Provider: office   I discussed the limitations, risks, security and privacy concerns of performing an evaluation and management service by telephone and the availability of in person appointments. The patient expressed understanding and agreed to proceed.  Unable to perform video visit due to video visit attempted and failed and/or patient does not have video capability.   Some vital signs may be absent or patient reported.   Willette Brace, LPN    Subjective:   Lindsay Reyes is a 70 y.o. female who presents for Medicare Annual (Subsequent) preventive examination.  Review of Systems     Cardiac Risk Factors include: advanced age (>71men, >4 women);dyslipidemia     Objective:    There were no vitals filed for this visit. There is no height or weight on file to calculate BMI.  Advanced Directives 02/13/2021 11/26/2020 01/15/2020 05/03/2017 04/16/2017 09/10/2013  Does Patient Have a Medical Advance Directive? No No No No No Patient does not have advance directive;Patient would like information  Type of Advance Directive [place]  Does patient want to make changes to medical advance directive? No - Patient declined - - - - -  Would patient like information on creating a medical advance directive? - - No - Patient declined - - Advance directive packet given    Current Medications (verified) Outpatient Encounter Medications as of 02/13/2021  Medication Sig  . diclofenac (VOLTAREN) 75 MG EC tablet Take 1 tablet (75 mg total) by mouth 2 (two) times daily.  . Multiple Vitamin (MULTIVITAMIN)  tablet Take 1 tablet by mouth daily.  . pantoprazole (PROTONIX) 40 MG tablet TAKE 1 TABLET (40 MG TOTAL) BY MOUTH 2 (TWO) TIMES DAILY BEFORE A MEAL.  Marland Kitchen QUEtiapine (SEROQUEL) 25 MG tablet TAKE 2 TABLETS (50 MG TOTAL) BY MOUTH AT BEDTIME.  Marland Kitchen zolpidem (AMBIEN) 10 MG tablet TAKE 1 TABLET BY MOUTH EVERY DAY AT BEDTIME AS NEEDED FOR SLEEP  . [DISCONTINUED] Valbenazine Tosylate (INGREZZA) 40 MG CAPS Take 1 capsule (40 mg total) by mouth daily. (Patient not taking: Reported on 02/13/2021)   No facility-administered encounter medications on file as of 02/13/2021.    Allergies (verified) Patient has no known allergies.   History: Past Medical History:  Diagnosis Date  . Arthritis   . Colon polyp   . Diverticulosis   . GERD (gastroesophageal reflux disease)   . Insomnia   . Retinal micro-aneurysm of right eye   . Scoliosis    Past Surgical History:  Procedure Laterality Date  . ABDOMINAL HYSTERECTOMY  2002  . COLONOSCOPY     Family History  Problem Relation Age of Onset  . Hypertension Mother   . Diabetes Mother   . Osteoporosis Mother   . Heart Problems Mother   . Thyroid cancer Sister   . Colon cancer Neg Hx    Social History   Socioeconomic History  . Marital status: Single    Spouse name: Not on file  . Number of children: Not on file  . Years of education: Not on file  . Highest education level: Not on file  Occupational History  .  Not on file  Tobacco Use  . Smoking status: Never Smoker  . Smokeless tobacco: Never Used  Vaping Use  . Vaping Use: Never used  Substance and Sexual Activity  . Alcohol use: Not Currently    Comment: socially  . Drug use: No  . Sexual activity: Not on file  Other Topics Concern  . Not on file  Social History Narrative  . Not on file   Social Determinants of Health   Financial Resource Strain: Low Risk   . Difficulty of Paying Living Expenses: Not hard at all  Food Insecurity: No Food Insecurity  . Worried About Sales executive in the Last Year: Never true  . Ran Out of Food in the Last Year: Never true  Transportation Needs: No Transportation Needs  . Lack of Transportation (Medical): No  . Lack of Transportation (Non-Medical): No  Physical Activity: Sufficiently Active  . Days of Exercise per Week: 3 days  . Minutes of Exercise per Session: 50 min  Stress: No Stress Concern Present  . Feeling of Stress : Not at all  Social Connections: Socially Isolated  . Frequency of Communication with Friends and Family: Once a week  . Frequency of Social Gatherings with Friends and Family: Never  . Attends Religious Services: Never  . Active Member of Clubs or Organizations: No  . Attends Archivist Meetings: Never  . Marital Status: Never married    Tobacco Counseling Counseling given: Not Answered   Clinical Intake:  Pre-visit preparation completed: Yes  Pain : No/denies pain     BMI - recorded: 26.76 Nutritional Status: BMI 25 -29 Overweight Nutritional Risks: None Diabetes: No  How often do you need to have someone help you when you read instructions, pamphlets, or other written materials from your doctor or pharmacy?: 1 - Never  Diabetic?no  Interpreter Needed?: No  Information entered by :: Charlott Rakes, LPN   Activities of Daily Living In your present state of health, do you have any difficulty performing the following activities: 02/13/2021  Hearing? N  Vision? N  Difficulty concentrating or making decisions? N  Walking or climbing stairs? N  Dressing or bathing? N  Doing errands, shopping? N  Preparing Food and eating ? N  Using the Toilet? N  In the past six months, have you accidently leaked urine? Y  Do you have problems with loss of bowel control? N  Managing your Medications? N  Managing your Finances? N  Housekeeping or managing your Housekeeping? N  Some recent data might be hidden    Patient Care Team: [name], MD as PCP - General (Family  Medicine) Ladene Artist, MD as Consulting Physician (Gastroenterology) Gerda Diss, DO as Consulting Physician (Sports Medicine) Sherryl Barters, OD (Optometry)  Indicate any recent Medical Services you may have received from other than [facility] providers in the past year (date may be approximate).     Assessment:   This is a routine wellness examination for Lindsay Reyes.  Hearing/Vision screen  Hearing Screening   125Hz  250Hz  500Hz  1000Hz  2000Hz  3000Hz  4000Hz  6000Hz  8000Hz   Right ear:           Left ear:           Comments: Pt denies any hearing issues   Vision Screening Comments: Pt denies annual eye exams   Dietary issues and exercise activities discussed: Current Exercise Habits: Home exercise routine, Type of exercise: Other - see comments, Time (Minutes): 45, Frequency (  Times/Week): 3, Weekly Exercise (Minutes/Week): 135  Goals   None    Depression Screen PHQ 2/9 Scores 02/13/2021 01/15/2020 03/01/2018  PHQ - 2 Score 0 - 6  PHQ- 9 Score - - 25  Exception Documentation - Patient refusal -    Fall Risk Fall Risk  02/13/2021 01/15/2020 09/29/2018 06/30/2018 03/01/2018  Falls in the past year? 0 0 No Yes No  Number falls in past yr: 0 0 - 2 or more -  Injury with Fall? 0 0 - No -  Risk Factor Category  - - - High Fall Risk -  Risk for fall due to : Impaired vision - - - -  Follow up Falls prevention discussed Falls evaluation completed;Education provided;Falls prevention discussed - - -    FALL RISK PREVENTION PERTAINING TO THE HOME:  Any stairs in or around the home? No  If so, are there any without handrails? No  Home free of loose throw rugs in walkways, pet beds, electrical cords, etc? Yes  Adequate lighting in your home to reduce risk of falls? Yes   ASSISTIVE DEVICES UTILIZED TO PREVENT FALLS:  Life alert? No  Use of a cane, walker or w/c? No  Grab bars in the bathroom? No  Shower chair or bench in shower? No  Elevated toilet seat or a handicapped toilet? No    TIMED UP AND GO:  Was the test performed? No .      Cognitive Function: declined 6CIT      6CIT Screen 01/15/2020  What Year? 0 points  What month? 0 points  What time? 0 points  Count back from 20 0 points  Months in reverse 0 points  Repeat phrase 0 points  Total Score 0    Immunizations Immunization History  Administered Date(s) Administered  . Td 12/08/2003  . Tdap 05/23/2014  . Zoster Recombinat (Shingrix) 12/26/2019, 03/25/2020    TDAP status: Up to date  Flu Vaccine status: Declined, Education has been provided regarding the importance of this vaccine but patient still declined. Advised may receive this vaccine at local pharmacy or Health Dept. Aware to provide a copy of the vaccination record if obtained from local pharmacy or Health Dept. Verbalized acceptance and understanding.  Pneumococcal vaccine status: Declined,  Education has been provided regarding the importance of this vaccine but patient still declined. Advised may receive this vaccine at local pharmacy or Health Dept. Aware to provide a copy of the vaccination record if obtained from local pharmacy or Health Dept. Verbalized acceptance and understanding.   Covid-19 vaccine status: Declined, Education has been provided regarding the importance of this vaccine but patient still declined. Advised may receive this vaccine at local pharmacy or Health Dept.or vaccine clinic. Aware to provide a copy of the vaccination record if obtained from local pharmacy or Health Dept. Verbalized acceptance and understanding.  Qualifies for Shingles Vaccine? Yes   Zostavax completed Yes   Shingrix Completed?: Yes  Screening Tests Health Maintenance  Topic Date Due  . COVID-19 Vaccine (1) Never done  . INFLUENZA VACCINE  03/06/2021 (Originally 07/07/2020)  . COLONOSCOPY (Pts 45-16yrs Insurance coverage will need to be confirmed)  07/15/2031 (Originally 05/01/1997)  . TETANUS/TDAP  05/23/2024  . DEXA SCAN  Completed  .  Hepatitis C Screening  Completed  . HPV VACCINES  Aged Out  . MAMMOGRAM  Discontinued  . PNA vac Low Risk Adult  Discontinued    Health Maintenance  Health Maintenance Due  Topic Date Due  . COVID-19  Vaccine (1) Never done    Colorectal cancer screening: No longer required.   Mammogram status: Completed 01/21/17. Repeat every year  Bone Density status: Completed 04/29/20. Results reflect: Bone density results: OSTEOPENIA. Repeat every 2 years.  Additional Screening:  Hepatitis C Screening:  Completed 09/29/18  Vision Screening: Recommended annual ophthalmology exams for early detection of glaucoma and other disorders of the eye. Is the patient up to date with their annual eye exam?  No  Who is the provider or what is the name of the office in which the patient attends annual eye exams? Has not followed up  If pt is not established with a provider, would they like to be referred to a provider to establish care? No .   Dental Screening: Recommended annual dental exams for proper oral hygiene  Community Resource Referral / Chronic Care Management: CRR required this visit?  No   CCM required this visit?  No      Plan:     I have personally reviewed and noted the following in the patient's chart:   . Medical and social history . Use of alcohol, tobacco or illicit drugs  . Current medications and supplements . Functional ability and status . Nutritional status . Physical activity . Advanced directives . List of other physicians . Hospitalizations, surgeries, and ER visits in previous 12 months . Vitals . Screenings to include cognitive, depression, and falls . Referrals and appointments  In addition, I have reviewed and discussed with patient certain preventive protocols, quality metrics, and best practice recommendations. A written personalized care plan for preventive services as well as general preventive health recommendations were provided to patient.     Willette Brace, LPN   04/01/8340   Nurse Notes: Pt declined and 6CIT and making another appt for AWV

## 2021-02-13 NOTE — Patient Instructions (Addendum)
Lindsay Reyes , Thank you for taking time to come for your Medicare Wellness Visit. I appreciate your ongoing commitment to your health goals. Please review the following plan we discussed and let me know if I can assist you in the future.   Screening recommendations/referrals: Colonoscopy: Declined  Mammogram: Done 01/21/17 Bone Density: Done 04/29/20 Recommended yearly ophthalmology/optometry visit for glaucoma screening and checkup Recommended yearly dental visit for hygiene and checkup  Vaccinations: Influenza vaccine: Declined  Pneumococcal vaccine: Declined  Tdap vaccine: Up to date Shingles vaccine: Completed 1/19 & 03/25/20   Covid-19:Declined and discussed no Vaccines   Advanced directives: Advance directive discussed with you today. Even though you declined this today please call our office should you change your mind and we can give you the proper paperwork for you to fill out.  Conditions/risks identified: None at this time   Next appointment: Follow up in one year for your annual wellness visit    Preventive Care 65 Years and Older, Female Preventive care refers to lifestyle choices and visits with your health care provider that can promote health and wellness. What does preventive care include?  A yearly physical exam. This is also called an annual well check.  Dental exams once or twice a year.  Routine eye exams. Ask your health care provider how often you should have your eyes checked.  Personal lifestyle choices, including:  Daily care of your teeth and gums.  Regular physical activity.  Eating a healthy diet.  Avoiding tobacco and drug use.  Limiting alcohol use.  Practicing safe sex.  Taking low-dose aspirin every day.  Taking vitamin and mineral supplements as recommended by your health care provider. What happens during an annual well check? The services and screenings done by your health care provider during your annual well check will depend on  your age, overall health, lifestyle risk factors, and family history of disease. Counseling  Your health care provider may ask you questions about your:  Alcohol use.  Tobacco use.  Drug use.  Emotional well-being.  Home and relationship well-being.  Sexual activity.  Eating habits.  History of falls.  Memory and ability to understand (cognition).  Work and work Statistician.  Reproductive health. Screening  You may have the following tests or measurements:  Height, weight, and BMI.  Blood pressure.  Lipid and cholesterol levels. These may be checked every 5 years, or more frequently if you are over 42 years old.  Skin check.  Lung cancer screening. You may have this screening every year starting at age 88 if you have a 30-pack-year history of smoking and currently smoke or have quit within the past 15 years.  Fecal occult blood test (FOBT) of the stool. You may have this test every year starting at age 88.  Flexible sigmoidoscopy or colonoscopy. You may have a sigmoidoscopy every 5 years or a colonoscopy every 10 years starting at age 44.  Hepatitis C blood test.  Hepatitis B blood test.  Sexually transmitted disease (STD) testing.  Diabetes screening. This is done by checking your blood sugar (glucose) after you have not eaten for a while (fasting). You may have this done every 1-3 years.  Bone density scan. This is done to screen for osteoporosis. You may have this done starting at age 25.  Mammogram. This may be done every 1-2 years. Talk to your health care provider about how often you should have regular mammograms. Talk with your health care provider about your test results, treatment options,  and if necessary, the need for more tests. Vaccines  Your health care provider may recommend certain vaccines, such as:  Influenza vaccine. This is recommended every year.  Tetanus, diphtheria, and acellular pertussis (Tdap, Td) vaccine. You may need a Td booster  every 10 years.  Zoster vaccine. You may need this after age 40.  Pneumococcal 13-valent conjugate (PCV13) vaccine. One dose is recommended after age 64.  Pneumococcal polysaccharide (PPSV23) vaccine. One dose is recommended after age 24. Talk to your health care provider about which screenings and vaccines you need and how often you need them. This information is not intended to replace advice given to you by your health care provider. Make sure you discuss any questions you have with your health care provider. Document Released: 12/20/2015 Document Revised: 08/12/2016 Document Reviewed: 09/24/2015 Elsevier Interactive Patient Education  2017 [place] Prevention in the Home Falls can cause injuries. They can happen to people of all ages. There are many things you can do to make your home safe and to help prevent falls. What can I do on the outside of my home?  Regularly fix the edges of walkways and driveways and fix any cracks.  Remove anything that might make you trip as you walk through a door, such as a raised step or threshold.  Trim any bushes or trees on the path to your home.  Use bright outdoor lighting.  Clear any walking paths of anything that might make someone trip, such as rocks or tools.  Regularly check to see if handrails are loose or broken. Make sure that both sides of any steps have handrails.  Any raised decks and porches should have guardrails on the edges.  Have any leaves, snow, or ice cleared regularly.  Use sand or salt on walking paths during winter.  Clean up any spills in your garage right away. This includes oil or grease spills. What can I do in the bathroom?  Use night lights.  Install grab bars by the toilet and in the tub and shower. Do not use towel bars as grab bars.  Use non-skid mats or decals in the tub or shower.  If you need to sit down in the shower, use a plastic, non-slip stool.  Keep the floor dry. Clean up any  water that spills on the floor as soon as it happens.  Remove soap buildup in the tub or shower regularly.  Attach bath mats securely with double-sided non-slip rug tape.  Do not have throw rugs and other things on the floor that can make you trip. What can I do in the bedroom?  Use night lights.  Make sure that you have a light by your bed that is easy to reach.  Do not use any sheets or blankets that are too big for your bed. They should not hang down onto the floor.  Have a firm chair that has side arms. You can use this for support while you get dressed.  Do not have throw rugs and other things on the floor that can make you trip. What can I do in the kitchen?  Clean up any spills right away.  Avoid walking on wet floors.  Keep items that you use a lot in easy-to-reach places.  If you need to reach something above you, use a strong step stool that has a grab bar.  Keep electrical cords out of the way.  Do not use floor polish or wax that makes floors slippery. If  you must use wax, use non-skid floor wax.  Do not have throw rugs and other things on the floor that can make you trip. What can I do with my stairs?  Do not leave any items on the stairs.  Make sure that there are handrails on both sides of the stairs and use them. Fix handrails that are broken or loose. Make sure that handrails are as long as the stairways.  Check any carpeting to make sure that it is firmly attached to the stairs. Fix any carpet that is loose or worn.  Avoid having throw rugs at the top or bottom of the stairs. If you do have throw rugs, attach them to the floor with carpet tape.  Make sure that you have a light switch at the top of the stairs and the bottom of the stairs. If you do not have them, ask someone to add them for you. What else can I do to help prevent falls?  Wear shoes that:  Do not have high heels.  Have rubber bottoms.  Are comfortable and fit you well.  Are closed  at the toe. Do not wear sandals.  If you use a stepladder:  Make sure that it is fully opened. Do not climb a closed stepladder.  Make sure that both sides of the stepladder are locked into place.  Ask someone to hold it for you, if possible.  Clearly mark and make sure that you can see:  Any grab bars or handrails.  First and last steps.  Where the edge of each step is.  Use tools that help you move around (mobility aids) if they are needed. These include:  Canes.  Walkers.  Scooters.  Crutches.  Turn on the lights when you go into a dark area. Replace any light bulbs as soon as they burn out.  Set up your furniture so you have a clear path. Avoid moving your furniture around.  If any of your floors are uneven, fix them.  If there are any pets around you, be aware of where they are.  Review your medicines with your doctor. Some medicines can make you feel dizzy. This can increase your chance of falling. Ask your doctor what other things that you can do to help prevent falls. This information is not intended to replace advice given to you by your health care provider. Make sure you discuss any questions you have with your health care provider. Document Released: 09/19/2009 Document Revised: 04/30/2016 Document Reviewed: 12/28/2014 Elsevier Interactive Patient Education  2017 [employer].

## 2021-02-28 ENCOUNTER — Ambulatory Visit (INDEPENDENT_AMBULATORY_CARE_PROVIDER_SITE_OTHER): Payer: Medicare HMO | Admitting: Family Medicine

## 2021-02-28 ENCOUNTER — Other Ambulatory Visit: Payer: Self-pay

## 2021-02-28 ENCOUNTER — Encounter: Payer: Self-pay | Admitting: Family Medicine

## 2021-02-28 VITALS — BP 111/74 | HR 91 | Temp 98.0°F | Ht 64.0 in | Wt 156.2 lb

## 2021-02-28 DIAGNOSIS — G4700 Insomnia, unspecified: Secondary | ICD-10-CM | POA: Diagnosis not present

## 2021-02-28 DIAGNOSIS — L299 Pruritus, unspecified: Secondary | ICD-10-CM | POA: Diagnosis not present

## 2021-02-28 NOTE — Assessment & Plan Note (Signed)
Recommended Sarna lotion.  Had labs within the last year which were normal.

## 2021-02-28 NOTE — Assessment & Plan Note (Signed)
Doing well with current regimen of Ambien and Seroquel.  Does not need refills today.

## 2021-02-28 NOTE — Progress Notes (Signed)
   Lindsay Reyes is a 70 y.o. female who presents today for an office visit.  Assessment/Plan:  Chronic Problems Addressed Today: Pruritus Recommended Sarna lotion.  Had labs within the last year which were normal.  Insomnia Doing well with current regimen of Ambien and Seroquel.  Does not need refills today.  Preventative Healthcare Discussed advanced directives and DNR today.  She wishes to be DNR.  She was given advance directive packet.  She will complete at home and get notarized and return to our office.    Subjective:  HPI:  Patient would like to have DNR and advanced directive form completed today.  She is otherwise doing well.  She has been having issues with pruritus.  She has tried using lotions which does not help.  Symptoms are worse after getting out of chair.  She has had issue with dry skin as well.       Objective:  Physical Exam: BP 111/74   Pulse 91   Temp 98 F (36.7 C) (Temporal)   Ht 5\' 4"  (1.626 m)   Wt 156 lb 3.2 oz (70.9 kg)   SpO2 99%   BMI 26.81 kg/m   Gen: No acute distress, resting comfortably Neuro: Grossly normal, moves all extremities Psych: Normal affect and thought content      Ambreen Tufte M. [name], MD 02/28/2021 12:32 PM

## 2021-02-28 NOTE — Patient Instructions (Signed)
It was very nice to see you today!  We will complete your advance directive and DNR forms today.  Please try the sarna lotion.  Take care,  Dr [name]  PLEASE NOTE:  If you had any lab tests please let us know if you have not heard back within a few days. You may see your results on mychart before we have a chance to review them but we will give you a call once they are reviewed by [place]. If we ordered any referrals today, please let us know if you have not heard from their office within the next week.   Please try these tips to maintain a healthy lifestyle:   Eat at least 3 REAL meals and 1-2 snacks per day.  Aim for no more than 5 hours between eating.  If you eat breakfast, please do so within one hour of getting up.    Each meal should contain half fruits/vegetables, one quarter protein, and one quarter carbs (no bigger than a computer mouse)   Cut down on sweet beverages. This includes juice, soda, and sweet tea.     Drink at least 1 glass of water with each meal and aim for at least 8 glasses per day   Exercise at least 150 minutes every week.

## 2021-04-08 ENCOUNTER — Other Ambulatory Visit: Payer: Self-pay

## 2021-04-08 ENCOUNTER — Encounter: Payer: Self-pay | Admitting: Family Medicine

## 2021-04-08 ENCOUNTER — Ambulatory Visit (INDEPENDENT_AMBULATORY_CARE_PROVIDER_SITE_OTHER): Payer: Medicare HMO | Admitting: Family Medicine

## 2021-04-08 VITALS — BP 116/78 | HR 89 | Temp 97.4°F | Ht 64.0 in | Wt 153.0 lb

## 2021-04-08 DIAGNOSIS — M545 Low back pain, unspecified: Secondary | ICD-10-CM

## 2021-04-08 MED ORDER — KETOROLAC TROMETHAMINE 60 MG/2ML IM SOLN
60.0000 mg | Freq: Once | INTRAMUSCULAR | Status: AC
  Administered 2021-04-08: 60 mg via INTRAMUSCULAR

## 2021-04-08 MED ORDER — METHYLPREDNISOLONE ACETATE 80 MG/ML IJ SUSP
80.0000 mg | Freq: Once | INTRAMUSCULAR | Status: DC
Start: 2021-04-08 — End: 2021-04-08

## 2021-04-08 MED ORDER — METHYLPREDNISOLONE ACETATE 80 MG/ML IJ SUSP
80.0000 mg | Freq: Once | INTRAMUSCULAR | Status: AC
  Administered 2021-04-08: 80 mg via INTRAMUSCULAR

## 2021-04-08 MED ORDER — CYCLOBENZAPRINE HCL 10 MG PO TABS: 10.0000 mg | ORAL_TABLET | Freq: Three times a day (TID) | ORAL | 0 refills | Status: DC | PRN

## 2021-04-08 NOTE — Assessment & Plan Note (Signed)
Flareup today of her chronic low back pain.  No red flags.  She does have a fair degree of muscular tenderness which is also likely playing a role.  Given her severe degree of pain will give 80 mg of Depo-Medrol and 60 mg of Toradol today.  We will also start Flexeril.  She has seen physical therapy in the past would like to see a specialist.  Will place referral to sports medicine.  Discussed reasons to return to care.

## 2021-04-08 NOTE — Addendum Note (Signed)
Addended by: Betti Cruz on: 04/08/2021 01:41 PM   Modules accepted: Orders

## 2021-04-08 NOTE — Patient Instructions (Signed)
It was very nice to see you today!  We will give you 2 injections today to help you with your pain and inflammation.  Please continue using ice packs.  You can also alternate with heat.  Please take the muscle relaxer as needed.  I will place a referral for you to see a sports medicine specialist.  Take care, Dr [name]  PLEASE NOTE:  If you had any lab tests please let us know if you have not heard back within a few days. You may see your results on mychart before we have a chance to review them but we will give you a call once they are reviewed by [place]. If we ordered any referrals today, please let us know if you have not heard from their office within the next week.   Please try these tips to maintain a healthy lifestyle:   Eat at least 3 REAL meals and 1-2 snacks per day.  Aim for no more than 5 hours between eating.  If you eat breakfast, please do so within one hour of getting up.    Each meal should contain half fruits/vegetables, one quarter protein, and one quarter carbs (no bigger than a computer mouse)   Cut down on sweet beverages. This includes juice, soda, and sweet tea.     Drink at least 1 glass of water with each meal and aim for at least 8 glasses per day   Exercise at least 150 minutes every week.

## 2021-04-08 NOTE — Progress Notes (Signed)
   Lindsay Reyes is a 70 y.o. female who presents today for an office visit.  Assessment/Plan:  Chronic Problems Addressed Today: LOW BACK PAIN, CHRONIC Flareup today of her chronic low back pain.  No red flags.  She does have a fair degree of muscular tenderness which is also likely playing a role.  Given her severe degree of pain will give 80 mg of Depo-Medrol and 60 mg of Toradol today.  We will also start Flexeril.  She has seen physical therapy in the past would like to see a specialist.  Will place referral to sports medicine.  Discussed reasons to return to care.     Subjective:  HPI:  Patient here with severe right lower back and buttock pain.  Started a few weeks ago.  No obvious injuries or precipitating events.  Has tried ibuprofen and ice with modest improvement.  Worse with certain activities.  She tried exercising and made symptoms worse.  Pain does not radiate.  Hurts when pressing on upper buttocks.       Objective:  Physical Exam: BP 116/78   Pulse 89   Temp (!) 97.4 F (36.3 C) (Temporal)   Ht 5\' 4"  (1.626 m)   Wt 153 lb (69.4 kg)   SpO2 97%   BMI 26.26 kg/m   Gen: No acute distress, resting comfortably MSK: Back without deformities.  Tenderness to palpation along superior iliac crest and posterior greater trochanter.  Pain elicited with hip flexion, extension and rotation. Neuro: Grossly normal, moves all extremities Psych: Normal affect and thought content      Matty Deamer M. [name], MD 04/08/2021 1:26 PM

## 2021-04-09 NOTE — Progress Notes (Signed)
Subjective:    CC: Low back pain  I, Molly Weber, LAT, ATC, am serving as scribe for Dr. [name].  HPI: Pt is a 70 y/o female presenting w/ chronic low back pain w/ no known mechanism. Pt reports she woke up one morning with the pain. She was seen by her PCP on 04/08/21 and was provided an IM depo 80 and Toradol 60 injection.  She locates her pain to R side low back/SIJ region w/ radiating pain to lateral aspect of R hip .  Radiating pain: yes LE numbness/tingling: no Aggravating factors: movement, walking Treatments tried: Flexeril; PT in the past;   Diagnostic imaging: L-spine XR- 04/05/08  Pertinent review of Systems: No fevers or chills  Relevant historical information: Osteopenia   Objective:    Vitals:   04/10/21 1343  BP: 140/90  Pulse: 76  SpO2: 96%   General: Well Developed, well nourished, and in no acute distress.   MSK: L-spine normal. Nontender midline. Tender palpation right lower lumbar paraspinal musculature and SI joint region. Decreased lumbar motion. Lower extremity strength is intact except noted below. Right hip normal-appearing Normal motion pain with flexion and adduction Tender to palpation greater trochanter and buttocks and along the course of gluteus medius Hip abduction and external rotation strength diminished 4/5 with pain.  Lab and Radiology Results X-ray images right hip and L-spine obtained today personally and independently interpreted  L-spine: Significant degenerative changes lumbar spine with scoliosis pattern present convex left.  No acute fractures are visible.  Right hip: No acute fractures.  No significant degenerative changes.  Tiny amount of enthesiopathy change superior portion of greater trochanter  Await formal radiology review   Impression and Recommendations:    Assessment and Plan: 70 y.o. female with right buttocks to the lateral hip pain acutely worsening over the last few weeks thought to be related  to muscle dysfunction.  She certainly does have considerable degenerative changes in her lumbar spine and probably is playing a role here however the acutely worsening pain along the course of musculature would suggest that muscle spasm and dysfunction is the main source of pain.  Plan for physical therapy as main treatment modality along with heating pad and TENS unit.  She already has been prescribed muscle relaxers with little benefit and just recently had a steroid injection intramuscular that is too early to tell if it is going to help much.  Recheck in about 6 weeks.  Return sooner if needed.Marland Kitchen  PDMP not reviewed this encounter. Orders Placed This Encounter  Procedures  . DG Lumbar Spine 2-3 Views    Standing Status:   Future    Number of Occurrences:   1    Standing Expiration Date:   04/10/2022    Order Specific Question:   Reason for Exam (SYMPTOM  OR DIAGNOSIS REQUIRED)    Answer:   eval rt low back pain    Order Specific Question:   Preferred imaging location?    Answer:   [facility]  . DG HIP UNILAT WITH PELVIS 2-3 VIEWS RIGHT    Standing Status:   Future    Number of Occurrences:   1    Standing Expiration Date:   04/10/2022    Order Specific Question:   Reason for Exam (SYMPTOM  OR DIAGNOSIS REQUIRED)    Answer:   eval hip pain    Order Specific Question:   Preferred imaging location?    Answer:   [facility]  .  Ambulatory referral to Physical Therapy    Referral Priority:   Routine    Referral Type:   Physical Medicine    Referral Reason:   Specialty Services Required    Requested Specialty:   Physical Therapy   No orders of the defined types were placed in this encounter.   Discussed warning signs or symptoms. Please see discharge instructions. Patient expresses understanding.   The above documentation has been reviewed and is accurate and complete [name], M.D.

## 2021-04-10 ENCOUNTER — Ambulatory Visit: Payer: Medicare HMO | Admitting: Family Medicine

## 2021-04-10 ENCOUNTER — Other Ambulatory Visit: Payer: Self-pay

## 2021-04-10 ENCOUNTER — Ambulatory Visit (INDEPENDENT_AMBULATORY_CARE_PROVIDER_SITE_OTHER): Payer: Medicare HMO

## 2021-04-10 VITALS — BP 140/90 | HR 76 | Ht 64.0 in | Wt 151.8 lb

## 2021-04-10 DIAGNOSIS — M7061 Trochanteric bursitis, right hip: Secondary | ICD-10-CM | POA: Diagnosis not present

## 2021-04-10 DIAGNOSIS — M47816 Spondylosis without myelopathy or radiculopathy, lumbar region: Secondary | ICD-10-CM | POA: Diagnosis not present

## 2021-04-10 DIAGNOSIS — M545 Low back pain, unspecified: Secondary | ICD-10-CM

## 2021-04-10 NOTE — Patient Instructions (Addendum)
Thank you for coming in today.  I've referred you to Physical Therapy.  Let us know if you don't hear from them in one week.  Please get an Xray today before you leave  Use heating pad and TENS unit.   TENS UNIT: This is helpful for muscle pain and spasm.   Search and Purchase a TENS 7000 2nd edition at  www.[email or web address] or www.[email or web address] It should be less than $30.     TENS unit instructions: Do not shower or bathe with the unit on . Turn the unit off before removing electrodes or batteries . If the electrodes lose stickiness add a drop of water to the electrodes after they are disconnected from the unit and place on plastic sheet. If you continued to have difficulty, call the TENS unit company to purchase more electrodes. . Do not apply lotion on the skin area prior to use. Make sure the skin is clean and dry as this will help prolong the life of the electrodes. . After use, always check skin for unusual red areas, rash or other skin difficulties. If there are any skin problems, does not apply electrodes to the same area. . Never remove the electrodes from the unit by pulling the wires. . Do not use the TENS unit or electrodes other than as directed. . Do not change electrode placement without consultating your therapist or physician. Marland Kitchen Keep 2 fingers with between each electrode. . Wear time ratio is 2:1, on to off times.    For example on for 30 minutes off for 15 minutes and then on for 30 minutes off for 15 minutes    Recheck with me in about 4 weeks.   Let me know sooner if you have a problem.   Try to off load the pressure on your right hip.

## 2021-04-14 NOTE — Progress Notes (Signed)
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  The right hip looks okay on x-ray.

## 2021-04-14 NOTE — Progress Notes (Signed)
X-ray lumbar spine shows some stable scoliosis

## 2021-04-22 ENCOUNTER — Other Ambulatory Visit: Payer: Self-pay

## 2021-04-22 ENCOUNTER — Ambulatory Visit: Payer: Medicare HMO | Attending: Family Medicine

## 2021-04-22 DIAGNOSIS — M25552 Pain in left hip: Secondary | ICD-10-CM | POA: Insufficient documentation

## 2021-04-22 DIAGNOSIS — M25551 Pain in right hip: Secondary | ICD-10-CM | POA: Insufficient documentation

## 2021-04-22 DIAGNOSIS — R252 Cramp and spasm: Secondary | ICD-10-CM | POA: Diagnosis not present

## 2021-04-22 DIAGNOSIS — M545 Low back pain, unspecified: Secondary | ICD-10-CM | POA: Insufficient documentation

## 2021-04-22 NOTE — Therapy (Signed)
[address]. [place], [place], [address] Phone: [phone]   Fax:  [phone]  Physical Therapy Evaluation  Patient Details  Name: Lindsay Reyes MRN: [id number] Date of Birth: December 21, 1950 Referring Provider (PT): Georgina Snell   Encounter Date: 04/22/2021   PT End of Session - 04/22/21 1452    Visit Number 1    Number of Visits 17    Date for PT Re-Evaluation 06/24/21    Authorization Type [employer]    PT Start Time 1400    PT Stop Time 1445    PT Time Calculation (min) 45 min    Activity Tolerance Patient tolerated treatment well;Patient limited by pain    Behavior During Therapy [facility] for tasks assessed/performed;Restless   Needed multiple positional changes thorughout eval d/t pain and discomfort          Past Medical History:  Diagnosis Date  . Arthritis   . Colon polyp   . Diverticulosis   . GERD (gastroesophageal reflux disease)   . Insomnia   . Retinal micro-aneurysm of right eye   . Scoliosis     Past Surgical History:  Procedure Laterality Date  . ABDOMINAL HYSTERECTOMY  2002  . COLONOSCOPY      There were no vitals filed for this visit.    Subjective Assessment - 04/22/21 1402    Subjective Pt with low back pain  into hip that is more recently onto the left. She say she has been horizontal (side lying) for a month, unable to astand and sit for a length of time. Exacerbated about a month ago and reports she has had episodes of this on and off. More noticeable when walking as well. Difficulty/unable to bend to do housework, even brush teeth. Wsa previously going to gym 3d/wk but was unable to when the pain happened again. Also reports getting injections into both side of back/posterior hip but relief only lasted 2 days    Pertinent History reports back issues since 70 yo.  arthritis, osteopenia (gets xrays every 2 yrs), heartburn    Patient Stated Goals to get spasms under control, rid of pain, back to  normal, be able to roll out of bed without pain    Currently in Pain? Yes    Pain Score 7     Pain Location Back   mainly on the left side into back of left hip now   Pain Descriptors / Indicators Spasm   if moves quickly it can be a sharp pain   Pain Type Acute pain    Aggravating Factors  rolling over    Pain Relieving Factors laying horzontal, heating pad (only helps while its on)              [facility] PT Assessment - 04/22/21 1338      Assessment   Medical Diagnosis M54.50 (ICD-10-CM) - Acute right-sided low back pain without sciatica  M70.61 (ICD-10-CM) - Trochanteric bursitis of right hip    Referring Provider (PT) Georgina Snell    Onset Date/Surgical Date --   about 1 month ago   Prior Therapy has had injections , no PT      Balance Screen   Has the patient fallen in the past 6 months Yes    How many times? "tend to drag feet"    Has the patient had a decrease in activity level because of a fear of falling?  No    Is the patient reluctant to leave their home because  of a fear of falling?  No      Home Environment   Additional Comments townhome, alone. 1 level no steps at all      Prior Function   Level of Independence Independent    Vocation Requirements Retired: used to work for [employer] walk, drive, exercise - To be able to go somewhere without limitations of pain      Cognition   Overall Cognitive Status Within Functional Limits for tasks assessed      Observation/Other Assessments   Focus on Therapeutic Outcomes (FOTO)  36%      ROM / Strength   AROM / PROM / Strength AROM;Strength      AROM   Overall AROM Comments rotation 50% B. Lat flex 25% to R wnd 30% to the left. 10% extension + pain into sacrum and tailbone. Forward flexion full palsm to floor but slow and painful.      Strength   Overall Strength Comments grosly 3+/5 B hips (limited by pain) and 4-/5 B knees      Palpation   Palpation comment TTP lumbar spine, paraspinals, QL,  glutes (left side >> right side)      Ambulation/Gait   Gait Comments Antalgic (L>R), asymmetrical WS, decreased step length/stride B      Standardized Balance Assessment   Standardized Balance Assessment Five Times Sit to Stand    Five times sit to stand comments  23 seconds   reports incr pain  with first 2 reps but decreased more she did it.                     Objective measurements completed on examination: See above findings.               PT Education - 04/22/21 1452    Education Details PT POC and HEP. Access Code: [email or web address] . Also discussed safe use of heating pad, moving slowly with mobility and strength exercises within pain tolerated ranges    Person(s) Educated Patient    Methods Explanation;Demonstration;Handout    Comprehension Verbalized understanding;Returned demonstration;Need further instruction            PT Short Term Goals - 04/22/21 1615      PT SHORT TERM GOAL #1   Title Independent with initial HEP    Time 2    Period Weeks    Status New    Target Date 05/06/21      PT SHORT TERM GOAL #2   Title Pt will report improved sitting and standing tolerance with </=4/10 pain in back and hips    Time 5    Period Weeks    Target Date 05/27/21             PT Long Term Goals - 04/22/21 1615      PT LONG TERM GOAL #1   Title Pt will be independent with advanced HEP and will return to independent gym program    Time 8    Period Weeks    Status New    Target Date 06/24/21      PT LONG TERM GOAL #2   Title Pt will complete 5TSTS in at least 10 seconds without UE support and with </= 2/10 pain to demosntrate improved functional LE strength    Time 8    Period Weeks    Status New    Target Date 06/24/21      PT LONG TERM GOAL #  3   Title Pt will ambulate with normalized gait pattern and </= 2/10 back and hip pain    Time 8    Period Weeks    Target Date 06/24/21      PT LONG TERM GOAL #4   Title Lumbar ROM improved by at  least 25 % in all directions with no greater than 2/10 pain    Time 8    Period Weeks    Target Date 06/24/21      PT LONG TERM GOAL #5   Title FOTO improved to 60% to demonstrate improved functional mobility    Baseline 04/22/21: 36%    Time 8    Period Weeks    Target Date 06/24/21                  Plan - 04/22/21 1350    Clinical Impression Statement Pt is a 70 yo female who presents with acute exacerbation of LBP in addition to pain into the left hip. She reports at time she saw MD pain was more right sided and over past week or so pain seems to have shifted over to the left. She currently presents with highly irritable LBP with pain into the gluteal region bilaterally, grossly limited lumbar ROM d/t pain, decreased strength and abnormal gait. As a result she has been having difficulty completing ADLs, functional mobility d/t pain. She will benefit from skilled physical therapy to address aforementioned limitations, decrease pain, and  work towards PLOF.    Personal Factors and Comorbidities Past/Current Experience    Examination-Activity Limitations Locomotion Level;Bed Mobility;Bend;Carry;Stand;Lift    Stability/Clinical Decision Making Evolving/Moderate complexity    Clinical Decision Making Moderate    Rehab Potential Fair    PT Frequency 2x / week    PT Duration 8 weeks    PT Treatment/Interventions ADLs/Self Care Home Management;Cryotherapy;Electrical Stimulation;Iontophoresis 4mg /ml Dexamethasone;Moist Heat;Neuromuscular re-education;Balance training;Therapeutic exercise;Therapeutic activities;Functional mobility training;Patient/family education;Manual techniques;Energy conservation;Dry needling;Taping;Vestibular    PT Next Visit Plan Reassess HEP. Gradually progress core stab, lumbar mobility, BLE strengthening as toelrated. Mnaual and modalities as needed.    PT Home Exercise Plan see pt edu    Consulted and Agree with Plan of Care Patient           Patient will  benefit from skilled therapeutic intervention in order to improve the following deficits and impairments:  Abnormal gait,Difficulty walking,Increased muscle spasms,Decreased endurance,Pain,Improper body mechanics,Decreased strength,Decreased mobility  Visit Diagnosis: Cramp and spasm - Plan: PT plan of care cert/re-cert  Pain in left hip - Plan: PT plan of care cert/re-cert  Pain in right hip - Plan: PT plan of care cert/re-cert  Acute low back pain, unspecified back pain laterality, unspecified whether sciatica present - Plan: PT plan of care cert/re-cert     Problem List Patient Active Problem List   Diagnosis Date Noted  . Pruritus 02/28/2021  . Movement disorder 08/26/2020  . Stress 06/11/2020  . Dyslipidemia 04/30/2020  . Hyperglycemia 04/30/2020  . 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 Pelvic pain 08/10/2019  . Rhinorrhea 02/16/2019  . Sleep-disordered breathing 02/16/2019  . 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  . Osteopenia 05/03/2018  . Allergic rhinitis 04/01/2018  . Insomnia 03/01/2018  . 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 Urinary incontinence 01/18/2018  . LOW BACK PAIN, CHRONIC 12/12/2007  . DIVERTICULOSIS, COLON W/O HEM 08/25/2007  . GERD 04/15/2007    Hall Busing, PT, DPT 04/22/2021, 6:22 PM  [address].  [place], [place], [address] Phone: [phone]   Fax:  [phone]  Name: Ritu Gagliardo MRN: [id number] Date of Birth: 03-20-1951

## 2021-04-22 NOTE — Patient Instructions (Signed)
Access Code: [email or web address] URL: [email or web address] Date: 04/22/2021 Prepared by: Sherlynn Stalls  Exercises Seated March - 1 x daily - 7 x weekly - 2 sets - 10 reps Seated Long Arc Quad - 1 x daily - 7 x weekly - 2 sets - 10 reps Supine Posterior Pelvic Tilt - 1 x daily - 7 x weekly - 2 sets - 10 reps Hooklying Gluteal Sets - 1 x daily - 7 x weekly - 2 sets - 10 reps - 5- 10 seconds hold Supine Lower Trunk Rotation - 1 x daily - 7 x weekly - 2 sets - 10 reps   Discussed moving slowly within pain tolerated ranges,safe use of heating pad as needed

## 2021-04-25 ENCOUNTER — Ambulatory Visit: Payer: Medicare HMO | Admitting: Physical Therapy

## 2021-04-28 ENCOUNTER — Other Ambulatory Visit: Payer: Self-pay

## 2021-04-28 ENCOUNTER — Encounter: Payer: Self-pay | Admitting: Physical Therapy

## 2021-04-28 ENCOUNTER — Ambulatory Visit: Payer: Medicare HMO | Admitting: Physical Therapy

## 2021-04-28 DIAGNOSIS — M25552 Pain in left hip: Secondary | ICD-10-CM

## 2021-04-28 DIAGNOSIS — M545 Low back pain, unspecified: Secondary | ICD-10-CM | POA: Diagnosis not present

## 2021-04-28 DIAGNOSIS — M25551 Pain in right hip: Secondary | ICD-10-CM | POA: Diagnosis not present

## 2021-04-28 DIAGNOSIS — R252 Cramp and spasm: Secondary | ICD-10-CM | POA: Diagnosis not present

## 2021-04-28 NOTE — Therapy (Signed)
[address]. [place], [place], [address] Phone: [phone]   Fax:  [phone]  Physical Therapy Treatment  Patient Details  Name: Lindsay Reyes MRN: [id number] Date of Birth: [birth year] Referring Provider (PT): Marikay Alar Date: 04/28/2021   PT End of Session - 04/28/21 1451    Visit Number 2    Number of Visits 17    Date for PT Re-Evaluation 06/24/21    Authorization Type [employer]    PT Start Time 1401    PT Stop Time 1444    PT Time Calculation (min) 43 min    Activity Tolerance Patient tolerated treatment well;Patient limited by pain    Behavior During Therapy [facility] for tasks assessed/performed           Past Medical History:  Diagnosis Date  . Arthritis   . Colon polyp   . Diverticulosis   . GERD (gastroesophageal reflux disease)   . Insomnia   . Retinal micro-aneurysm of right eye   . Scoliosis     Past Surgical History:  Procedure Laterality Date  . ABDOMINAL HYSTERECTOMY  2002  . COLONOSCOPY      There were no vitals filed for this visit.   Subjective Assessment - 04/28/21 1359    Subjective Pt states no better; has been doing the exercises 2x/day.    Currently in Pain? Yes    Pain Score 10-Worst pain ever    Pain Location Back                             [facility] Adult PT Treatment/Exercise - 04/28/21 0001      Exercises   Exercises Knee/Hip      Knee/Hip Exercises: Stretches   Active Hamstring Stretch Both;1 rep;20 seconds    Active Hamstring Stretch Limitations seated    Piriformis Stretch Both;2 reps;20 seconds    Piriformis Stretch Limitations seated figure four    Other Knee/Hip Stretches seated fwd/lat lumbar flexion x3 3-5 sec hold      Knee/Hip Exercises: Aerobic   Nustep L3 x 6 min      Knee/Hip Exercises: Standing   Hip Flexion Both;1 set;10 reps    Hip Abduction Both;1 set;10 reps    Hip Extension Both;1 set;10 reps      Knee/Hip Exercises:  Supine   Bridges Both;1 set;10 reps    Bridges Limitations very limited ROM with PPT prior to bridge    Other Supine Knee/Hip Exercises dktc, ltr, small bridges on exercise ball x15   unable to tolerate small bridges     Modalities   Modalities Iontophoresis      Iontophoresis   Type of Iontophoresis Dexamethasone    Location L lower lumbar    Dose 80 mA    Time 4 hrs                    PT Short Term Goals - 04/22/21 1615      PT SHORT TERM GOAL #1   Title Independent with initial HEP    Time 2    Period Weeks    Status New    Target Date 05/06/21      PT SHORT TERM GOAL #2   Title Pt will report improved sitting and standing tolerance with </=4/10 pain in back and hips    Time 5    Period Weeks    Target Date 05/27/21  PT Long Term Goals - 04/22/21 1615      PT LONG TERM GOAL #1   Title Pt will be independent with advanced HEP and will return to independent gym program    Time 8    Period Weeks    Status New    Target Date 06/24/21      PT LONG TERM GOAL #2   Title Pt will complete 5TSTS in at least 10 seconds without UE support and with </= 2/10 pain to demosntrate improved functional LE strength    Time 8    Period Weeks    Status New    Target Date 06/24/21      PT LONG TERM GOAL #3   Title Pt will ambulate with normalized gait pattern and </= 2/10 back and hip pain    Time 8    Period Weeks    Target Date 06/24/21      PT LONG TERM GOAL #4   Title Lumbar ROM improved by at least 25 % in all directions with no greater than 2/10 pain    Time 8    Period Weeks    Target Date 06/24/21      PT LONG TERM GOAL #5   Title FOTO improved to 60% to demonstrate improved functional mobility    Baseline 04/22/21: 36%    Time 8    Period Weeks    Target Date 06/24/21                 Plan - 04/28/21 1452    Clinical Impression Statement Very gentle progression into TE with focus on ex's within painfree ROM. Able to tolerate  supine ex's for ~7-10 minutes before needing to readjust. No pain with standing hip ex's. Education on painfree progression of stretching. Added some stretching/strengthening to HEP, see pt instructions. Pain seems to be localized to L SI joint today; may want to assess next rx. Tried ionto for pain relief, assess response next rx.    PT Treatment/Interventions ADLs/Self Care Home Management;Cryotherapy;Electrical Stimulation;Iontophoresis 4mg /ml Dexamethasone;Moist Heat;Neuromuscular re-education;Balance training;Therapeutic exercise;Therapeutic activities;Functional mobility training;Patient/family education;Manual techniques;Energy conservation;Dry needling;Taping;Vestibular    PT Next Visit Plan Gradually progress core stab, lumbar mobility, BLE strengthening as toelrated. Mnaual and modalities as needed.    Consulted and Agree with Plan of Care Patient           Patient will benefit from skilled therapeutic intervention in order to improve the following deficits and impairments:  Abnormal gait,Difficulty walking,Increased muscle spasms,Decreased endurance,Pain,Improper body mechanics,Decreased strength,Decreased mobility  Visit Diagnosis: Cramp and spasm  Pain in left hip  Pain in right hip  Acute low back pain, unspecified back pain laterality, unspecified whether sciatica present     Problem List Patient Active Problem List   Diagnosis Date Noted  . Pruritus 02/28/2021  . Movement disorder 08/26/2020  . Stress 06/11/2020  . Dyslipidemia 04/30/2020  . Hyperglycemia 04/30/2020  . 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 Pelvic pain 08/10/2019  . Rhinorrhea 02/16/2019  . Sleep-disordered breathing 02/16/2019  . 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  . Osteopenia 05/03/2018  . Allergic rhinitis 04/01/2018  . Insomnia 03/01/2018  . 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 Urinary incontinence 01/18/2018  . LOW BACK PAIN, CHRONIC 12/12/2007  . DIVERTICULOSIS, COLON W/O HEM 08/25/2007   . GERD 04/15/2007   Amador Cunas, PT, DPT Donald Prose Shaketa Serafin 04/28/2021, 2:54 PM  [address]. [place], [place], [address] Phone: [phone]   Fax:  [phone]  Name: Lindsay Reyes MRN: [id number] Date of Birth: [birth year]

## 2021-04-30 ENCOUNTER — Other Ambulatory Visit: Payer: Self-pay

## 2021-04-30 ENCOUNTER — Encounter: Payer: Self-pay | Admitting: Physical Therapy

## 2021-04-30 ENCOUNTER — Ambulatory Visit: Payer: Medicare HMO | Admitting: Physical Therapy

## 2021-04-30 DIAGNOSIS — M545 Low back pain, unspecified: Secondary | ICD-10-CM | POA: Diagnosis not present

## 2021-04-30 DIAGNOSIS — M25552 Pain in left hip: Secondary | ICD-10-CM | POA: Diagnosis not present

## 2021-04-30 DIAGNOSIS — R252 Cramp and spasm: Secondary | ICD-10-CM

## 2021-04-30 DIAGNOSIS — M25551 Pain in right hip: Secondary | ICD-10-CM

## 2021-04-30 NOTE — Therapy (Signed)
[address]. [place], [place], [address] Phone: [phone]   Fax:  [phone]  Physical Therapy Treatment  Patient Details  Name: Lindsay Reyes MRN: [id number] Date of Birth: 15-Jan-1951 Referring Provider (PT): Marikay Alar Date: 04/30/2021   PT End of Session - 04/30/21 1341    Visit Number 3    Date for PT Re-Evaluation 06/24/21    Authorization Type [employer]    PT Start Time 1300    PT Stop Time 1343    PT Time Calculation (min) 43 min    Activity Tolerance Patient tolerated treatment well;Patient limited by pain    Behavior During Therapy [facility] for tasks assessed/performed           Past Medical History:  Diagnosis Date  . Arthritis   . Colon polyp   . Diverticulosis   . GERD (gastroesophageal reflux disease)   . Insomnia   . Retinal micro-aneurysm of right eye   . Scoliosis     Past Surgical History:  Procedure Laterality Date  . ABDOMINAL HYSTERECTOMY  2002  . COLONOSCOPY      There were no vitals filed for this visit.   Subjective Assessment - 04/30/21 1301    Subjective "Fine until I get out of bed" Cant stand or sit for any length of time or she has pain form back in her L flank    Currently in Pain? Yes    Pain Score 7     Pain Location Back   into L flank   Pain Orientation Left                             [facility] Adult PT Treatment/Exercise - 04/30/21 0001      Exercises   Exercises Lumbar;Knee/Hip      Lumbar Exercises: Stretches   Passive Hamstring Stretch Left;Right;2 reps;10 seconds    Single Knee to Chest Stretch 4 reps;10 seconds    Double Knee to Chest Stretch 4 reps;10 seconds;20 seconds    Lower Trunk Rotation 4 reps;10 seconds;20 seconds    Piriformis Stretch Right;Left;4 reps;10 seconds      Lumbar Exercises: Machines for Strengthening   Cybex Knee Extension 5lb 2x10    Cybex Knee Flexion 20lb 2x10      Lumbar Exercises: Standing   Row  Strengthening;Both;20 reps;Theraband    Theraband Level (Row) Level 3 (Green)    Shoulder Extension Both;20 reps;Theraband;Strengthening    Theraband Level (Shoulder Extension) Level 2 (Red)      Lumbar Exercises: Seated   Other Seated Lumbar Exercises Lats 20lb 2x10    Other Seated Lumbar Exercises ab set w/ pball 2x10      Knee/Hip Exercises: Aerobic   Nustep L4 x 6 min      Knee/Hip Exercises: Standing   Other Standing Knee Exercises Hip abd & Ext 2x10      Knee/Hip Exercises: Supine   Bridges Both;1 set;10 reps    Bridges Limitations very limited ROM                    PT Short Term Goals - 04/22/21 1615      PT SHORT TERM GOAL #1   Title Independent with initial HEP    Time 2    Period Weeks    Status New    Target Date 05/06/21      PT SHORT TERM GOAL #2  Title Pt will report improved sitting and standing tolerance with </=4/10 pain in back and hips    Time 5    Period Weeks    Target Date 05/27/21             PT Long Term Goals - 04/22/21 1615      PT LONG TERM GOAL #1   Title Pt will be independent with advanced HEP and will return to independent gym program    Time 8    Period Weeks    Status New    Target Date 06/24/21      PT LONG TERM GOAL #2   Title Pt will complete 5TSTS in at least 10 seconds without UE support and with </= 2/10 pain to demosntrate improved functional LE strength    Time 8    Period Weeks    Status New    Target Date 06/24/21      PT LONG TERM GOAL #3   Title Pt will ambulate with normalized gait pattern and </= 2/10 back and hip pain    Time 8    Period Weeks    Target Date 06/24/21      PT LONG TERM GOAL #4   Title Lumbar ROM improved by at least 25 % in all directions with no greater than 2/10 pain    Time 8    Period Weeks    Target Date 06/24/21      PT LONG TERM GOAL #5   Title FOTO improved to 60% to demonstrate improved functional mobility    Baseline 04/22/21: 36%    Time 8    Period Weeks     Target Date 06/24/21                 Plan - 04/30/21 1341    Clinical Impression Statement Pt enters clinic reporting pain with prolong sitting and standing. Tried to pick interventions that would alternate sitting and standing to avoid pain. She did well with a mote active treatment. LE did fatigue with standing hip exercises. Postural weakness present with standing shoulder extensions. Pt had good Hs flexibility. Some tightness noted in piriformis bilaterally and with lower trunk rotations.    Personal Factors and Comorbidities Past/Current Experience    Examination-Activity Limitations Locomotion Level;Bed Mobility;Bend;Carry;Stand;Lift    Stability/Clinical Decision Making Evolving/Moderate complexity    Rehab Potential Fair    PT Frequency 2x / week    PT Duration 8 weeks    PT Treatment/Interventions ADLs/Self Care Home Management;Cryotherapy;Electrical Stimulation;Iontophoresis 4mg /ml Dexamethasone;Moist Heat;Neuromuscular re-education;Balance training;Therapeutic exercise;Therapeutic activities;Functional mobility training;Patient/family education;Manual techniques;Energy conservation;Dry needling;Taping;Vestibular    PT Next Visit Plan Gradually progress core stab, lumbar mobility, BLE strengthening as toelrated. Mnaual and modalities as needed.           Patient will benefit from skilled therapeutic intervention in order to improve the following deficits and impairments:  Abnormal gait,Difficulty walking,Increased muscle spasms,Decreased endurance,Pain,Improper body mechanics,Decreased strength,Decreased mobility  Visit Diagnosis: Acute low back pain, unspecified back pain laterality, unspecified whether sciatica present  Pain in right hip  Pain in left hip  Cramp and spasm     Problem List Patient Active Problem List   Diagnosis Date Noted  . Pruritus 02/28/2021  . Movement disorder 08/26/2020  . Stress 06/11/2020  . Dyslipidemia 04/30/2020  . Hyperglycemia  04/30/2020  . 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 Pelvic pain 08/10/2019  . Rhinorrhea 02/16/2019  . Sleep-disordered breathing 02/16/2019  . 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  . Osteopenia 05/03/2018  .  Allergic rhinitis 04/01/2018  . Insomnia 03/01/2018  . 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 Urinary incontinence 01/18/2018  . LOW BACK PAIN, CHRONIC 12/12/2007  . DIVERTICULOSIS, COLON W/O HEM 08/25/2007  . GERD 04/15/2007    Scot Jun 04/30/2021, 1:45 PM  [address]. [place], [place], [address] Phone: [phone]   Fax:  [phone]  Name: Lindsay Reyes MRN: [id number] Date of Birth: 19-Aug-1951

## 2021-05-07 ENCOUNTER — Ambulatory Visit: Payer: Medicare HMO | Attending: Family Medicine | Admitting: Physical Therapy

## 2021-05-07 ENCOUNTER — Other Ambulatory Visit: Payer: Self-pay

## 2021-05-07 ENCOUNTER — Encounter: Payer: Self-pay | Admitting: Physical Therapy

## 2021-05-07 DIAGNOSIS — R252 Cramp and spasm: Secondary | ICD-10-CM | POA: Diagnosis not present

## 2021-05-07 DIAGNOSIS — M25552 Pain in left hip: Secondary | ICD-10-CM | POA: Diagnosis not present

## 2021-05-07 DIAGNOSIS — M545 Low back pain, unspecified: Secondary | ICD-10-CM | POA: Insufficient documentation

## 2021-05-07 DIAGNOSIS — M25551 Pain in right hip: Secondary | ICD-10-CM | POA: Diagnosis not present

## 2021-05-07 NOTE — Therapy (Signed)
[place]. [place], [place], [address] Phone: [phone]   Fax:  [phone]  Physical Therapy Treatment  Patient Details  Name: Lindsay Reyes MRN: [id number] Date of Birth: [birth year] Referring Provider (PT): Marikay Alar Date: 05/07/2021   PT End of Session - 05/07/21 1658    Visit Number 4    Number of Visits 17    Date for PT Re-Evaluation 06/24/21    Authorization Type [employer]    PT Start Time 1613    PT Stop Time 1709    PT Time Calculation (min) 56 min    Activity Tolerance Patient tolerated treatment well;Patient limited by pain    Behavior During Therapy [facility] for tasks assessed/performed           Past Medical History:  Diagnosis Date  . Arthritis   . Colon polyp   . Diverticulosis   . GERD (gastroesophageal reflux disease)   . Insomnia   . Retinal micro-aneurysm of right eye   . Scoliosis     Past Surgical History:  Procedure Laterality Date  . ABDOMINAL HYSTERECTOMY  2002  . COLONOSCOPY      There were no vitals filed for this visit.   Subjective Assessment - 05/07/21 1621    Subjective My hip is doing a little better, c/o more left flank pain, reports that sitting causes this, she points to left rib area and just above the iliac crest    Currently in Pain? Yes    Pain Score 4     Pain Location Flank    Pain Orientation Left    Pain Descriptors / Indicators Tightness;Spasm    Aggravating Factors  sitting                             [facility] Adult PT Treatment/Exercise - 05/07/21 0001      Lumbar Exercises: Stretches   Passive Hamstring Stretch Left;Right;4 reps;20 seconds    Piriformis Stretch Right;Left;4 reps;10 seconds      Lumbar Exercises: Supine   Other Supine Lumbar Exercises feet on ball K2C, trunk rotation, small bridges and isometric abs      Knee/Hip Exercises: Aerobic   Nustep L4 x 6 min      Modalities   Modalities Electrical  Stimulation;Moist Heat      Moist Heat Therapy   Number Minutes Moist Heat 10 Minutes    Moist Heat Location Lumbar Spine      Electrical Stimulation   Electrical Stimulation Location left buttock and flank    Electrical Stimulation Action IFC    Electrical Stimulation Parameters supine    Electrical Stimulation Goals Pain      Manual Therapy   Manual Therapy Manual Traction    Manual Traction manual pelvic traction with use of a sheet                    PT Short Term Goals - 05/07/21 1710      PT SHORT TERM GOAL #1   Title Independent with initial HEP    Status Achieved      PT SHORT TERM GOAL #2   Title Pt will report improved sitting and standing tolerance with </=4/10 pain in back and hips    Status Partially Met             PT Long Term Goals - 04/22/21 1615      PT  LONG TERM GOAL #1   Title Pt will be independent with advanced HEP and will return to independent gym program    Time 8    Period Weeks    Status New    Target Date 06/24/21      PT LONG TERM GOAL #2   Title Pt will complete 5TSTS in at least 10 seconds without UE support and with </= 2/10 pain to demosntrate improved functional LE strength    Time 8    Period Weeks    Status New    Target Date 06/24/21      PT LONG TERM GOAL #3   Title Pt will ambulate with normalized gait pattern and </= 2/10 back and hip pain    Time 8    Period Weeks    Target Date 06/24/21      PT LONG TERM GOAL #4   Title Lumbar ROM improved by at least 25 % in all directions with no greater than 2/10 pain    Time 8    Period Weeks    Target Date 06/24/21      PT LONG TERM GOAL #5   Title FOTO improved to 60% to demonstrate improved functional mobility    Baseline 04/22/21: 36%    Time 8    Period Weeks    Target Date 06/24/21                 Plan - 05/07/21 1659    Clinical Impression Statement Patient reports pain in the hip is a little better, c/o mostly left flank pain, she is very tight  in the lumbar area, she is tender in the left iliac crest and the left SI area.  I tried some manual traction, this seemd to feel good while we were doing it, I elected to try estim to her painful and tight areas to see if it would decrease pain and if we could get some carry over, I cautioned her that if this helped it is not a cure and that we need to continue with core strength and proper body mechanics    PT Next Visit Plan see if what we tried helped    Consulted and Agree with Plan of Care Patient           Patient will benefit from skilled therapeutic intervention in order to improve the following deficits and impairments:  Abnormal gait,Difficulty walking,Increased muscle spasms,Decreased endurance,Pain,Improper body mechanics,Decreased strength,Decreased mobility  Visit Diagnosis: Acute low back pain, unspecified back pain laterality, unspecified whether sciatica present  Pain in right hip  Pain in left hip  Cramp and spasm     Problem List Patient Active Problem List   Diagnosis Date Noted  . Pruritus 02/28/2021  . Movement disorder 08/26/2020  . Stress 06/11/2020  . Dyslipidemia 04/30/2020  . Hyperglycemia 04/30/2020  . 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 Pelvic pain 08/10/2019  . Rhinorrhea 02/16/2019  . Sleep-disordered breathing 02/16/2019  . 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  . Osteopenia 05/03/2018  . Allergic rhinitis 04/01/2018  . Insomnia 03/01/2018  . 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 Urinary incontinence 01/18/2018  . LOW BACK PAIN, CHRONIC 12/12/2007  . DIVERTICULOSIS, COLON W/O HEM 08/25/2007  . GERD 04/15/2007    Sumner Boast., PT 05/07/2021, 5:11 PM  [place]. [place], [place], [address] Phone: [phone]   Fax:  [phone]  Name: Danira Nylander MRN: [id number] Date of Birth: [birth year]

## 2021-05-08 ENCOUNTER — Encounter: Payer: Self-pay | Admitting: Physical Therapy

## 2021-05-08 ENCOUNTER — Ambulatory Visit: Payer: Medicare HMO | Admitting: Physical Therapy

## 2021-05-08 DIAGNOSIS — M545 Low back pain, unspecified: Secondary | ICD-10-CM | POA: Diagnosis not present

## 2021-05-08 DIAGNOSIS — M25551 Pain in right hip: Secondary | ICD-10-CM | POA: Diagnosis not present

## 2021-05-08 DIAGNOSIS — R252 Cramp and spasm: Secondary | ICD-10-CM

## 2021-05-08 DIAGNOSIS — M25552 Pain in left hip: Secondary | ICD-10-CM

## 2021-05-08 NOTE — Therapy (Signed)
[place]. [place], [place], [address] Phone: [phone]   Fax:  [phone]  Physical Therapy Treatment  Patient Details  Name: Lindsay Reyes MRN: [id number] Date of Birth: [birth year] Referring Provider (PT): Georgina Snell   Encounter Date: 05/08/2021   PT End of Session - 05/08/21 1500    Visit Number 5    Number of Visits 17    Date for PT Re-Evaluation 06/24/21    Authorization Type [employer]    PT Start Time 1430    PT Stop Time 1511    PT Time Calculation (min) 41 min    Activity Tolerance Patient tolerated treatment well    Behavior During Therapy [facility] for tasks assessed/performed           Past Medical History:  Diagnosis Date  . Arthritis   . Colon polyp   . Diverticulosis   . GERD (gastroesophageal reflux disease)   . Insomnia   . Retinal micro-aneurysm of right eye   . Scoliosis     Past Surgical History:  Procedure Laterality Date  . ABDOMINAL HYSTERECTOMY  2002  . COLONOSCOPY      There were no vitals filed for this visit.   Subjective Assessment - 05/08/21 1429    Subjective "I feel good "    Pertinent History reports back issues since 70 yo.  arthritis, osteopenia (gets xrays every 2 yrs), heartburn    Currently in Pain? No/denies                             [facility] Adult PT Treatment/Exercise - 05/08/21 0001      Lumbar Exercises: Stretches   Passive Hamstring Stretch Left;Right;4 reps;20 seconds    Piriformis Stretch Right;Left;4 reps;10 seconds      Lumbar Exercises: Supine   Ab Set 10 reps;2 seconds   x2   Other Supine Lumbar Exercises feet on ball K2C, trunk rotation, small bridges and isometric abs      Knee/Hip Exercises: Aerobic   Nustep L4 x 6 min      Moist Heat Therapy   Number Minutes Moist Heat 11 Minutes    Moist Heat Location Lumbar Spine      Electrical Stimulation   Electrical Stimulation Location left buttock and flank    Electrical  Stimulation Action IFC    Electrical Stimulation Parameters supine    Electrical Stimulation Goals Pain      Manual Therapy   Manual Therapy Manual Traction    Manual Traction manual distraction toboth LEs                    PT Short Term Goals - 05/07/21 1710      PT SHORT TERM GOAL #1   Title Independent with initial HEP    Status Achieved      PT SHORT TERM GOAL #2   Title Pt will report improved sitting and standing tolerance with </=4/10 pain in back and hips    Status Partially Met             PT Long Term Goals - 04/22/21 1615      PT LONG TERM GOAL #1   Title Pt will be independent with advanced HEP and will return to independent gym program    Time 8    Period Weeks    Status New    Target Date 06/24/21      PT  LONG TERM GOAL #2   Title Pt will complete 5TSTS in at least 10 seconds without UE support and with </= 2/10 pain to demosntrate improved functional LE strength    Time 8    Period Weeks    Status New    Target Date 06/24/21      PT LONG TERM GOAL #3   Title Pt will ambulate with normalized gait pattern and </= 2/10 back and hip pain    Time 8    Period Weeks    Target Date 06/24/21      PT LONG TERM GOAL #4   Title Lumbar ROM improved by at least 25 % in all directions with no greater than 2/10 pain    Time 8    Period Weeks    Target Date 06/24/21      PT LONG TERM GOAL #5   Title FOTO improved to 60% to demonstrate improved functional mobility    Baseline 04/22/21: 36%    Time 8    Period Weeks    Target Date 06/24/21                 Plan - 05/08/21 1500    Clinical Impression Statement Positive response from last session so interventions kept the same. Little elevation noted with supine bridges. Added supine ab sets without issue. Good HS flexibility noted. Reports she could feel it on her L side with lower trunk rotations. Positive response to e-stim    Personal Factors and Comorbidities Past/Current Experience     Examination-Activity Limitations Locomotion Level;Bed Mobility;Bend;Carry;Stand;Lift    Stability/Clinical Decision Making Evolving/Moderate complexity    Rehab Potential Fair    PT Frequency 2x / week    PT Duration 8 weeks    PT Treatment/Interventions ADLs/Self Care Home Management;Cryotherapy;Electrical Stimulation;Iontophoresis 26m/ml Dexamethasone;Moist Heat;Neuromuscular re-education;Balance training;Therapeutic exercise;Therapeutic activities;Functional mobility training;Patient/family education;Manual techniques;Energy conservation;Dry needling;Taping;Vestibular    PT Next Visit Plan core strength, modalities as needed           Patient will benefit from skilled therapeutic intervention in order to improve the following deficits and impairments:  Abnormal gait,Difficulty walking,Increased muscle spasms,Decreased endurance,Pain,Improper body mechanics,Decreased strength,Decreased mobility  Visit Diagnosis: Pain in left hip  Cramp and spasm  Pain in right hip  Acute low back pain, unspecified back pain laterality, unspecified whether sciatica present     Problem List Patient Active Problem List   Diagnosis Date Noted  . Pruritus 02/28/2021  . Movement disorder 08/26/2020  . Stress 06/11/2020  . Dyslipidemia 04/30/2020  . Hyperglycemia 04/30/2020  . 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 Pelvic pain 08/10/2019  . Rhinorrhea 02/16/2019  . Sleep-disordered breathing 02/16/2019  . 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  . Osteopenia 05/03/2018  . Allergic rhinitis 04/01/2018  . Insomnia 03/01/2018  . 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 Urinary incontinence 01/18/2018  . LOW BACK PAIN, CHRONIC 12/12/2007  . DIVERTICULOSIS, COLON W/O HEM 08/25/2007  . GERD 04/15/2007    RScot Jun6/01/2021, 3:05 PM  [place] [address]Phone: [phone]  Fax:  [phone] Name: Lindsay GuintherMRN: [id number] of Birth: [birth year]

## 2021-05-13 ENCOUNTER — Other Ambulatory Visit: Payer: Self-pay

## 2021-05-13 ENCOUNTER — Ambulatory Visit: Payer: Medicare HMO

## 2021-05-13 DIAGNOSIS — R252 Cramp and spasm: Secondary | ICD-10-CM | POA: Diagnosis not present

## 2021-05-13 DIAGNOSIS — M25552 Pain in left hip: Secondary | ICD-10-CM | POA: Diagnosis not present

## 2021-05-13 DIAGNOSIS — M545 Low back pain, unspecified: Secondary | ICD-10-CM

## 2021-05-13 DIAGNOSIS — M25551 Pain in right hip: Secondary | ICD-10-CM | POA: Diagnosis not present

## 2021-05-13 NOTE — Therapy (Signed)
[place]. [place], [place], [address] Phone: [phone]   Fax:  [phone]  Physical Therapy Treatment  Patient Details  Name: Lindsay Reyes MRN: [id number] Date of Birth: 1951-03-06 Referring Provider (PT): Marikay Alar Date: 05/13/2021   PT End of Session - 05/13/21 8502    Visit Number 6    Number of Visits 17    Date for PT Re-Evaluation 06/24/21    Authorization Type [employer]    PT Start Time 7741    PT Stop Time 1430    PT Time Calculation (min) 45 min    Activity Tolerance Patient tolerated treatment well    Behavior During Therapy [facility] for tasks assessed/performed           Past Medical History:  Diagnosis Date  . Arthritis   . Colon polyp   . Diverticulosis   . GERD (gastroesophageal reflux disease)   . Insomnia   . Retinal micro-aneurysm of right eye   . Scoliosis     Past Surgical History:  Procedure Laterality Date  . ABDOMINAL HYSTERECTOMY  2002  . COLONOSCOPY      There were no vitals filed for this visit.   Subjective Assessment - 05/13/21 1351    Subjective Doing okay.  Have been walking a little bit more often. Still getting tightness on side    Pertinent History reports back issues since 70 yo.  arthritis, osteopenia (gets xrays every 2 yrs), heartburn    Currently in Pain? Yes    Pain Score 1    side of trunk on the left, slightly in front   Pain Descriptors / Indicators Sharp;Dull   "hurts to take a deep breath"                            [facility] Adult PT Treatment/Exercise - 05/13/21 0001      Lumbar Exercises: Stretches   Passive Hamstring Stretch Left;Right;4 reps;20 seconds    Piriformis Stretch Right;Left;4 reps;10 seconds    Other Lumbar Stretch Exercise open book stretch x 5 each side    Other Lumbar Stretch Exercise 3 wall pball stretch x 5 each in sitting     Lumbar Exercises: Seated   Sit to Stand --   2x10 with red med ball, 1st set  from elevated mat table   Other Seated Lumbar Exercises alt BUE/BLE lifts 5x3      Lumbar Exercises: Supine   Ab Set 10 reps;2 seconds    Other Supine Lumbar Exercises feet on ball K2Cx15, trunk rotationx10 B, small bridges x15 and isometric absx15      Knee/Hip Exercises: Aerobic   Nustep L5 x 6 min                          PT Short Term Goals - 05/07/21 1710      PT SHORT TERM GOAL #1   Title Independent with initial HEP    Status Achieved      PT SHORT TERM GOAL #2   Title Pt will report improved sitting and standing tolerance with </=4/10 pain in back and hips    Status Partially Met             PT Long Term Goals - 04/22/21 1615      PT LONG TERM GOAL #1   Title Pt will be independent with advanced HEP and  will return to independent gym program    Time 8    Period Weeks    Status New    Target Date 06/24/21      PT LONG TERM GOAL #2   Title Pt will complete 5TSTS in at least 10 seconds without UE support and with </= 2/10 pain to demosntrate improved functional LE strength    Time 8    Period Weeks    Status New    Target Date 06/24/21      PT LONG TERM GOAL #3   Title Pt will ambulate with normalized gait pattern and </= 2/10 back and hip pain    Time 8    Period Weeks    Target Date 06/24/21      PT LONG TERM GOAL #4   Title Lumbar ROM improved by at least 25 % in all directions with no greater than 2/10 pain    Time 8    Period Weeks    Target Date 06/24/21      PT LONG TERM GOAL #5   Title FOTO improved to 60% to demonstrate improved functional mobility    Baseline 04/22/21: 36%    Time 8    Period Weeks    Target Date 06/24/21                 Plan - 05/13/21 1438    Clinical Impression Statement Pt opted aganst modalities today and wanted to focus more on exercises and stretches recommended by PT, c/o some pain/tightness in her left side and wanted to do some things for this. She tolerated all exercises well, improved  height noted with bridges, no increased discomfort reported with LTRs. initiated some sit to stand exercises today and core strength in sitting with good some cues required for coordination of UE/LE movements but otherwise no worsening pain. Plan to assess tolerance to these exercises in  next visit and progress as toelrated.    Personal Factors and Comorbidities Past/Current Experience    Examination-Activity Limitations Locomotion Level;Bed Mobility;Bend;Carry;Stand;Lift    Stability/Clinical Decision Making Evolving/Moderate complexity    Rehab Potential Fair    PT Frequency 2x / week    PT Duration 8 weeks    PT Treatment/Interventions ADLs/Self Care Home Management;Cryotherapy;Electrical Stimulation;Iontophoresis 9m/ml Dexamethasone;Moist Heat;Neuromuscular re-education;Balance training;Therapeutic exercise;Therapeutic activities;Functional mobility training;Patient/family education;Manual techniques;Energy conservation;Dry needling;Taping;Vestibular    PT Next Visit Plan core strength, modalities as needed    Recommended Other Services Pt reported continued pain near ischial tuberosities/lower glutes/pelvic area. Has a history of tailbone pain and reports getting burning sensation if se sits for too long. Discussed potential benefit of pelvic floor PT to assist with this but pt expressed she would like to just continue with current PT.    Consulted and Agree with Plan of Care Patient           Patient will benefit from skilled therapeutic intervention in order to improve the following deficits and impairments:  Abnormal gait,Difficulty walking,Increased muscle spasms,Decreased endurance,Pain,Improper body mechanics,Decreased strength,Decreased mobility  Visit Diagnosis: Cramp and spasm  Pain in left hip  Pain in right hip  Acute low back pain, unspecified back pain laterality, unspecified whether sciatica present     Problem List Patient Active Problem List   Diagnosis Date  Noted  . Pruritus 02/28/2021  . Movement disorder 08/26/2020  . Stress 06/11/2020  . Dyslipidemia 04/30/2020  . Hyperglycemia 04/30/2020  . 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 Pelvic pain 08/10/2019  . Rhinorrhea 02/16/2019  .  Sleep-disordered breathing 02/16/2019  . 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  . Osteopenia 05/03/2018  . Allergic rhinitis 04/01/2018  . Insomnia 03/01/2018  . 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 Urinary incontinence 01/18/2018  . LOW BACK PAIN, CHRONIC 12/12/2007  . DIVERTICULOSIS, COLON W/O HEM 08/25/2007  . GERD 04/15/2007    Hall Busing, PT, DPT 05/13/2021, 2:42 PM  [place]. [place], [place], [address] Phone: [phone]   Fax:  [phone]  Name: Lindsay Reyes MRN: [id number] Date of Birth: 1951-08-28

## 2021-05-15 ENCOUNTER — Ambulatory Visit: Payer: Medicare HMO

## 2021-05-15 ENCOUNTER — Other Ambulatory Visit: Payer: Self-pay

## 2021-05-15 DIAGNOSIS — M25552 Pain in left hip: Secondary | ICD-10-CM | POA: Diagnosis not present

## 2021-05-15 DIAGNOSIS — M545 Low back pain, unspecified: Secondary | ICD-10-CM | POA: Diagnosis not present

## 2021-05-15 DIAGNOSIS — M25551 Pain in right hip: Secondary | ICD-10-CM | POA: Diagnosis not present

## 2021-05-15 DIAGNOSIS — R252 Cramp and spasm: Secondary | ICD-10-CM

## 2021-05-15 NOTE — Therapy (Signed)
[place]. [place], [place], [address] Phone: [phone]   Fax:  [phone]  Physical Therapy Treatment  Patient Details  Name: Lindsay Reyes MRN: [id number] Date of Birth: 04-Oct-1951 Referring Provider (PT): Georgina Snell   Encounter Date: 05/15/2021   PT End of Session - 05/15/21 1352     Visit Number 7    Number of Visits 17    Date for PT Re-Evaluation 06/24/21    Authorization Type [employer]    PT Start Time 3710    PT Stop Time 1433    PT Time Calculation (min) 48 min    Activity Tolerance Patient tolerated treatment well    Behavior During Therapy [facility] for tasks assessed/performed             Past Medical History:  Diagnosis Date   Arthritis    Colon polyp    Diverticulosis    GERD (gastroesophageal reflux disease)    Insomnia    Retinal micro-aneurysm of right eye    Scoliosis     Past Surgical History:  Procedure Laterality Date   ABDOMINAL HYSTERECTOMY  2002   COLONOSCOPY      There were no vitals filed for this visit.   Subjective Assessment - 05/15/21 1350     Subjective Doing okay now. Felt fine after last session but later that day dropped a salt shaker and had to sweep, vacuum, and sweep up with dustpan to clean it up and this set off pain to a 10. had to lay on her side until 10 pm yesterday due to pain.    Pertinent History reports back issues since 70 yo.  arthritis, osteopenia (gets xrays every 2 yrs), heartburn    Currently in Pain? No/denies                               [facility] Adult PT Treatment/Exercise - 05/15/21 0001       Lumbar Exercises: Standing   Other Standing Lumbar Exercises trunk rotation in standing with red med ball x 2. Trialed in sitting with small pball instead x2 with right thoracic mms spasm/discomfort      Lumbar Exercises: Seated   Sit to Stand --   x5 with red med ball, yellow med ball x5, 2 x 10   Other Seated Lumbar Exercises --       Lumbar Exercises: Supine   Other Supine Lumbar Exercises feet on ball K2Cx20, trunk rotationx15 B      Knee/Hip Exercises: Aerobic   Nustep L5 x 6 min      Modalities   Modalities Electrical Stimulation;Moist Heat      Moist Heat Therapy   Number Minutes Moist Heat 10 Minutes    Moist Heat Location Lumbar Spine      Electrical Stimulation   Electrical Stimulation Location right thoracolumbar region    Electrical Stimulation Action IFC    Electrical Stimulation Goals Pain;Tone      Manual Therapy   Manual therapy comments STM and sustained pressure to areas of mms spasm  lower right thoracic, paraspinals, QL intercostals                      PT Short Term Goals - 05/07/21 1710       PT SHORT TERM GOAL #1   Title Independent with initial HEP    Status Achieved      PT  SHORT TERM GOAL #2   Title Pt will report improved sitting and standing tolerance with </=4/10 pain in back and hips    Status Partially Met               PT Long Term Goals - 04/22/21 1615       PT LONG TERM GOAL #1   Title Pt will be independent with advanced HEP and will return to independent gym program    Time 8    Period Weeks    Status New    Target Date 06/24/21      PT LONG TERM GOAL #2   Title Pt will complete 5TSTS in at least 10 seconds without UE support and with </= 2/10 pain to demosntrate improved functional LE strength    Time 8    Period Weeks    Status New    Target Date 06/24/21      PT LONG TERM GOAL #3   Title Pt will ambulate with normalized gait pattern and </= 2/10 back and hip pain    Time 8    Period Weeks    Target Date 06/24/21      PT LONG TERM GOAL #4   Title Lumbar ROM improved by at least 25 % in all directions with no greater than 2/10 pain    Time 8    Period Weeks    Target Date 06/24/21      PT LONG TERM GOAL #5   Title FOTO improved to 60% to demonstrate improved functional mobility    Baseline 04/22/21: 36%    Time 8    Period  Weeks    Target Date 06/24/21                   Plan - 05/15/21 1352     Clinical Impression Statement Despite report of pain exacerbation at home a few days ago pt was agreeable and expressed willingness to continue with PT exercises and interventions today. She did have an area of tenderness/ms spasm which was revealed during upright trunk rotation exercises, unable to complete d/t this (she does report noticing this area of discomfort over past week or so). Other interventions tolerated nicely, initial discomfort with supine LTRs but this subsided with increased reps. Incorporated some more MT and modalities end of session to help with this discomfort with mild improvement. Pt expressed concern about how high pain can still get and decreased quality of life when pain exacerbations occur and I encouraged her to follow up with MD about this.    Personal Factors and Comorbidities Past/Current Experience    Examination-Activity Limitations Locomotion Level;Bed Mobility;Bend;Carry;Stand;Lift    Stability/Clinical Decision Making Evolving/Moderate complexity    Rehab Potential Fair    PT Frequency 2x / week    PT Duration 8 weeks    PT Treatment/Interventions ADLs/Self Care Home Management;Cryotherapy;Electrical Stimulation;Iontophoresis 36m/ml Dexamethasone;Moist Heat;Neuromuscular re-education;Balance training;Therapeutic exercise;Therapeutic activities;Functional mobility training;Patient/family education;Manual techniques;Energy conservation;Dry needling;Taping;Vestibular    PT Next Visit Plan core strength, modalities as needed    Consulted and Agree with Plan of Care Patient             Patient will benefit from skilled therapeutic intervention in order to improve the following deficits and impairments:  Abnormal gait, Difficulty walking, Increased muscle spasms, Decreased endurance, Pain, Improper body mechanics, Decreased strength, Decreased mobility  Visit Diagnosis: Pain in  left hip  Cramp and spasm  Pain in right hip  Acute low back pain, unspecified  back pain laterality, unspecified whether sciatica present     Problem List Patient Active Problem List   Diagnosis Date Noted   Pruritus 02/28/2021   Movement disorder 08/26/2020   Stress 06/11/2020   Dyslipidemia 04/30/2020   Hyperglycemia 04/30/2020   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   Pelvic pain 08/10/2019   Rhinorrhea 02/16/2019   Sleep-disordered breathing 02/16/2019   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   Osteopenia 05/03/2018   Allergic rhinitis 04/01/2018   Insomnia 03/01/2018   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   Urinary incontinence 01/18/2018   LOW BACK PAIN, CHRONIC 12/12/2007   DIVERTICULOSIS, COLON W/O HEM 08/25/2007   GERD 04/15/2007    Hall Busing, PT, DPT 05/15/2021, 2:32 PM  [place]. [place], [place], [address] Phone: [phone]   Fax:  [phone]  Name: Laquitha Heslin MRN: [id number] Date of Birth: [birth year]

## 2021-05-20 ENCOUNTER — Encounter: Payer: Self-pay | Admitting: Physical Therapy

## 2021-05-20 ENCOUNTER — Other Ambulatory Visit: Payer: Self-pay

## 2021-05-20 ENCOUNTER — Ambulatory Visit: Payer: Medicare HMO | Admitting: Physical Therapy

## 2021-05-20 DIAGNOSIS — M25552 Pain in left hip: Secondary | ICD-10-CM

## 2021-05-20 DIAGNOSIS — M25551 Pain in right hip: Secondary | ICD-10-CM | POA: Diagnosis not present

## 2021-05-20 DIAGNOSIS — R252 Cramp and spasm: Secondary | ICD-10-CM

## 2021-05-20 DIAGNOSIS — M545 Low back pain, unspecified: Secondary | ICD-10-CM | POA: Diagnosis not present

## 2021-05-20 NOTE — Therapy (Signed)
[place]. [place], [place], [address] Phone: [phone]   Fax:  [phone]  Physical Therapy Treatment  Patient Details  Name: Lindsay Reyes MRN: [id number] Date of Birth: [birth year] Referring Provider (PT): Georgina Snell   Encounter Date: 05/20/2021   PT End of Session - 05/20/21 1425     Visit Number 8    Date for PT Re-Evaluation 06/24/21    Authorization Type [employer]    PT Start Time 0109    PT Stop Time 1427    PT Time Calculation (min) 42 min    Activity Tolerance Patient tolerated treatment well    Behavior During Therapy [facility] for tasks assessed/performed             Past Medical History:  Diagnosis Date   Arthritis    Colon polyp    Depression    Diverticulosis    GERD (gastroesophageal reflux disease)    Hyperlipidemia    Insomnia    Retinal micro-aneurysm of right eye    Scoliosis     Past Surgical History:  Procedure Laterality Date   ABDOMINAL HYSTERECTOMY  2002   COLONOSCOPY      There were no vitals filed for this visit.   Subjective Assessment - 05/20/21 1346     Subjective doing pretty good, no pain right now. Reports pain comes around 6 o'clock at night    Currently in Pain? No/denies                               [facility] Adult PT Treatment/Exercise - 05/20/21 0001       Lumbar Exercises: Machines for Strengthening   Other Lumbar Machine Exercise Rows & lats 20lb 2x10      Lumbar Exercises: Standing   Shoulder Extension Strengthening;Power Tower;20 reps    Shoulder Extension Limitations 5      Lumbar Exercises: Supine   Ab Set 10 reps;2 seconds    Bridge Compliant;20 reps;2 seconds    Straight Leg Raise 5 reps;2 seconds   3 sets each   Other Supine Lumbar Exercises ball squeezes 2x10, Hip abd green 2x15    Other Supine Lumbar Exercises Hooklying & march x10      Knee/Hip Exercises: Aerobic   Recumbent Bike L1 x 6 min      Knee/Hip Exercises:  Standing   Forward Step Up Both;2 sets;5 reps;Hand Hold: 0;Step Height: 4"    Walking with Sports Cord 20lb sides step x3 each                      PT Short Term Goals - 05/07/21 1710       PT SHORT TERM GOAL #1   Title Independent with initial HEP    Status Achieved      PT SHORT TERM GOAL #2   Title Pt will report improved sitting and standing tolerance with </=4/10 pain in back and hips    Status Partially Met               PT Long Term Goals - 04/22/21 1615       PT LONG TERM GOAL #1   Title Pt will be independent with advanced HEP and will return to independent gym program    Time 8    Period Weeks    Status New    Target Date 06/24/21      PT LONG  TERM GOAL #2   Title Pt will complete 5TSTS in at least 10 seconds without UE support and with </= 2/10 pain to demosntrate improved functional LE strength    Time 8    Period Weeks    Status New    Target Date 06/24/21      PT LONG TERM GOAL #3   Title Pt will ambulate with normalized gait pattern and </= 2/10 back and hip pain    Time 8    Period Weeks    Target Date 06/24/21      PT LONG TERM GOAL #4   Title Lumbar ROM improved by at least 25 % in all directions with no greater than 2/10 pain    Time 8    Period Weeks    Target Date 06/24/21      PT LONG TERM GOAL #5   Title FOTO improved to 60% to demonstrate improved functional mobility    Baseline 04/22/21: 36%    Time 8    Period Weeks    Target Date 06/24/21                   Plan - 05/20/21 1426     Clinical Impression Statement Pt enters clinic reporting no pain. She did well with a progressed treatment session. Some instability present with resisted side step. She did well with step ups. Postural cues needed with standing shoulder extensions. Pt elected to do more exercise instead of E-Stim. Added more supine core interventions Some LE fatigue with SLR.    Personal Factors and Comorbidities Past/Current Experience     Examination-Activity Limitations Locomotion Level;Bed Mobility;Bend;Carry;Stand;Lift    Stability/Clinical Decision Making Evolving/Moderate complexity    Rehab Potential Fair    PT Frequency 2x / week    PT Duration 8 weeks    PT Treatment/Interventions ADLs/Self Care Home Management;Cryotherapy;Electrical Stimulation;Iontophoresis 9m/ml Dexamethasone;Moist Heat;Neuromuscular re-education;Balance training;Therapeutic exercise;Therapeutic activities;Functional mobility training;Patient/family education;Manual techniques;Energy conservation;Dry needling;Taping;Vestibular    PT Next Visit Plan core strength, modalities as needed             Patient will benefit from skilled therapeutic intervention in order to improve the following deficits and impairments:  Abnormal gait, Difficulty walking, Increased muscle spasms, Decreased endurance, Pain, Improper body mechanics, Decreased strength, Decreased mobility  Visit Diagnosis: Pain in right hip  Acute low back pain, unspecified back pain laterality, unspecified whether sciatica present  Cramp and spasm  Pain in left hip     Problem List Patient Active Problem List   Diagnosis Date Noted   Pruritus 02/28/2021   Movement disorder 08/26/2020   Stress 06/11/2020   Dyslipidemia 04/30/2020   Hyperglycemia 04/30/2020   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   Pelvic pain 08/10/2019   Rhinorrhea 02/16/2019   Sleep-disordered breathing 02/16/2019   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   Osteopenia 05/03/2018   Allergic rhinitis 04/01/2018   Insomnia 03/01/2018   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   Urinary incontinence 01/18/2018   LOW BACK PAIN, CHRONIC 12/12/2007   DIVERTICULOSIS, COLON W/O HEM 08/25/2007   GERD 04/15/2007    RScot Jun PTA 05/20/2021, 2:30 PM  [place] [address]Phone: [phone]  Fax:  [phone] Name: Lindsay LahmannMRN: [id number] of Birth: [birth year]

## 2021-05-22 ENCOUNTER — Other Ambulatory Visit: Payer: Self-pay

## 2021-05-22 ENCOUNTER — Ambulatory Visit: Payer: Medicare HMO | Admitting: Physical Therapy

## 2021-05-22 ENCOUNTER — Encounter: Payer: Self-pay | Admitting: Physical Therapy

## 2021-05-22 DIAGNOSIS — M25551 Pain in right hip: Secondary | ICD-10-CM | POA: Diagnosis not present

## 2021-05-22 DIAGNOSIS — M545 Low back pain, unspecified: Secondary | ICD-10-CM

## 2021-05-22 DIAGNOSIS — R252 Cramp and spasm: Secondary | ICD-10-CM

## 2021-05-22 DIAGNOSIS — M25552 Pain in left hip: Secondary | ICD-10-CM | POA: Diagnosis not present

## 2021-05-22 NOTE — Therapy (Signed)
[place]. [place], [place], [address] Phone: [phone]   Fax:  [phone]  Physical Therapy Treatment  Patient Details  Name: Lindsay Reyes MRN: [id number] Date of Birth: 07/10/1951 Referring Provider (PT): Georgina Snell   Encounter Date: 05/22/2021   PT End of Session - 05/22/21 1425     Visit Number 9    Number of Visits 17    Date for PT Re-Evaluation 06/24/21    Authorization Type [employer]    PT Start Time 1343    PT Stop Time 1427    PT Time Calculation (min) 44 min    Activity Tolerance Patient tolerated treatment well    Behavior During Therapy [facility] for tasks assessed/performed             Past Medical History:  Diagnosis Date   Arthritis    Colon polyp    Depression    Diverticulosis    GERD (gastroesophageal reflux disease)    Hyperlipidemia    Insomnia    Retinal micro-aneurysm of right eye    Scoliosis     Past Surgical History:  Procedure Laterality Date   ABDOMINAL HYSTERECTOMY  2002   COLONOSCOPY      There were no vitals filed for this visit.   Subjective Assessment - 05/22/21 1348     Subjective I have no pain now, a little stiffness in the hips    Currently in Pain? No/denies                               [facility] Adult PT Treatment/Exercise - 05/22/21 0001       Lumbar Exercises: Stretches   Single Knee to Chest Stretch Right;Left;4 reps;10 seconds      Lumbar Exercises: Machines for Strengthening   Cybex Knee Extension 5lb 2x10    Cybex Knee Flexion 20lb 2x10    Leg Press 20lb 2x10    Other Lumbar Machine Exercise Rows & lats 20lb 2x10      Lumbar Exercises: Standing   Shoulder Extension Strengthening;Power Tower;20 reps    Shoulder Extension Limitations 5      Lumbar Exercises: Supine   Other Supine Lumbar Exercises feet on ball K2Cx20, trunk rotationx15 B      Knee/Hip Exercises: Aerobic   Nustep L5 x 6 min                       PT Short Term Goals - 05/07/21 1710       PT SHORT TERM GOAL #1   Title Independent with initial HEP    Status Achieved      PT SHORT TERM GOAL #2   Title Pt will report improved sitting and standing tolerance with </=4/10 pain in back and hips    Status Partially Met               PT Long Term Goals - 04/22/21 1615       PT LONG TERM GOAL #1   Title Pt will be independent with advanced HEP and will return to independent gym program    Time 8    Period Weeks    Status New    Target Date 06/24/21      PT LONG TERM GOAL #2   Title Pt will complete 5TSTS in at least 10 seconds without UE support and with </= 2/10 pain to demosntrate improved functional LE  strength    Time 8    Period Weeks    Status New    Target Date 06/24/21      PT LONG TERM GOAL #3   Title Pt will ambulate with normalized gait pattern and </= 2/10 back and hip pain    Time 8    Period Weeks    Target Date 06/24/21      PT LONG TERM GOAL #4   Title Lumbar ROM improved by at least 25 % in all directions with no greater than 2/10 pain    Time 8    Period Weeks    Target Date 06/24/21      PT LONG TERM GOAL #5   Title FOTO improved to 60% to demonstrate improved functional mobility    Baseline 04/22/21: 36%    Time 8    Period Weeks    Target Date 06/24/21                   Plan - 05/22/21 1426     Clinical Impression Statement Pt tolerated today's intervention well, evident by no subjective reports of increase pain. Added additional postural and LE strength without issue. Cues needed to engage core with seated rows and extensions. Tactile cues not to allow trunk flexion with shoulder extensions.    Personal Factors and Comorbidities Past/Current Experience    Examination-Activity Limitations Locomotion Level;Bed Mobility;Bend;Carry;Stand;Lift    Stability/Clinical Decision Making Evolving/Moderate complexity    Rehab Potential Fair    PT Frequency 2x / week     PT Duration 8 weeks    PT Treatment/Interventions ADLs/Self Care Home Management;Cryotherapy;Electrical Stimulation;Iontophoresis 77m/ml Dexamethasone;Moist Heat;Neuromuscular re-education;Balance training;Therapeutic exercise;Therapeutic activities;Functional mobility training;Patient/family education;Manual techniques;Energy conservation;Dry needling;Taping;Vestibular    PT Next Visit Plan core strength, modalities as needed             Patient will benefit from skilled therapeutic intervention in order to improve the following deficits and impairments:  Abnormal gait, Difficulty walking, Increased muscle spasms, Decreased endurance, Pain, Improper body mechanics, Decreased strength, Decreased mobility  Visit Diagnosis: Pain in right hip  Pain in left hip  Cramp and spasm  Acute low back pain, unspecified back pain laterality, unspecified whether sciatica present     Problem List Patient Active Problem List   Diagnosis Date Noted   Pruritus 02/28/2021   Movement disorder 08/26/2020   Stress 06/11/2020   Dyslipidemia 04/30/2020   Hyperglycemia 04/30/2020   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   Pelvic pain 08/10/2019   Rhinorrhea 02/16/2019   Sleep-disordered breathing 02/16/2019   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   Osteopenia 05/03/2018   Allergic rhinitis 04/01/2018   Insomnia 03/01/2018   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   Urinary incontinence 01/18/2018   LOW BACK PAIN, CHRONIC 12/12/2007   DIVERTICULOSIS, COLON W/O HEM 08/25/2007   GERD 04/15/2007    RScot Jun6/16/2022, 2:29 PM  [place] [address]Phone: [phone]  Fax:  [phone] Name: Lindsay BettcherMRN: [id number] of Birth: [birth year]

## 2021-05-27 ENCOUNTER — Ambulatory Visit: Payer: Medicare HMO

## 2021-05-27 ENCOUNTER — Other Ambulatory Visit: Payer: Self-pay

## 2021-05-27 DIAGNOSIS — M25552 Pain in left hip: Secondary | ICD-10-CM

## 2021-05-27 DIAGNOSIS — M545 Low back pain, unspecified: Secondary | ICD-10-CM | POA: Diagnosis not present

## 2021-05-27 DIAGNOSIS — M25551 Pain in right hip: Secondary | ICD-10-CM

## 2021-05-27 DIAGNOSIS — R252 Cramp and spasm: Secondary | ICD-10-CM | POA: Diagnosis not present

## 2021-05-27 NOTE — Therapy (Signed)
[place]. [place], [place], [address] Phone: [phone]   Fax:  [phone]  Physical Therapy Treatment Progress Note Reporting Period 04/22/21 to 05/27/21  See note below for Objective Data and Assessment of Progress/Goals.     Patient Details  Name: Lindsay Reyes MRN: [id number] Date of Birth: April 04, 1951 Referring Provider (PT): Marikay Alar Date: 05/27/2021   PT End of Session - 05/27/21 1311     Visit Number 10    Number of Visits 17    Date for PT Re-Evaluation 06/24/21    Authorization Type [employer]    PT Start Time 1300    PT Stop Time 1340    PT Time Calculation (min) 40 min    Activity Tolerance Patient tolerated treatment well    Behavior During Therapy [facility] for tasks assessed/performed             Past Medical History:  Diagnosis Date   Arthritis    Colon polyp    Depression    Diverticulosis    GERD (gastroesophageal reflux disease)    Hyperlipidemia    Insomnia    Retinal micro-aneurysm of right eye    Scoliosis     Past Surgical History:  Procedure Laterality Date   ABDOMINAL HYSTERECTOMY  2002   COLONOSCOPY      There were no vitals filed for this visit.   Subjective Assessment - 05/27/21 1310     Subjective I have no pain now but i have been layingdown most of day. My side hurts when i do house chores    Currently in Pain? No/denies                [facility] PT Assessment - 05/27/21 0001       Assessment   Medical Diagnosis M54.50 (ICD-10-CM) - Acute right-sided low back pain without sciatica  M70.61 (ICD-10-CM) - Trochanteric bursitis of right hip    Referring Provider (PT) Georgina Snell      Standardized Balance Assessment   Standardized Balance Assessment Five Times Sit to Stand    Five times sit to stand comments  9                           [facility] Adult PT Treatment/Exercise - 05/27/21 0001       Lumbar Exercises: Stretches   ITB Stretch  Left;2 reps   3-4 breaths   Other Lumbar Stretch Exercise ITB stretch by wall.      Lumbar Exercises: Machines for Strengthening   Cybex Knee Extension 10lb 2x10    Cybex Knee Flexion 20lb 2x10    Leg Press 30lb 2x10    Other Lumbar Machine Exercise Rows & lats 20lb 2x15      Lumbar Exercises: Standing   Shoulder Extension Strengthening;Power Tower;20 reps    Shoulder Extension Limitations 5      Lumbar Exercises: Supine   Other Supine Lumbar Exercises feet on ball K2Cx20, trunk rotationx15 B      Knee/Hip Exercises: Aerobic   Nustep L5 x 6 min                      PT Short Term Goals - 05/07/21 1710       PT SHORT TERM GOAL #1   Title Independent with initial HEP    Status Achieved      PT SHORT TERM GOAL #2   Title Pt will  report improved sitting and standing tolerance with </=4/10 pain in back and hips    Status Partially Met               PT Long Term Goals - 05/27/21 1311       PT LONG TERM GOAL #1   Title Pt will be independent with advanced HEP and will return to independent gym program    Time 8    Period Weeks    Status On-going      PT LONG TERM GOAL #2   Title Pt will complete 5TSTS in at least 10 seconds without UE support and with </= 2/10 pain to demosntrate improved functional LE strength    Time 8    Period Weeks    Status Achieved      PT LONG TERM GOAL #3   Title Pt will ambulate with normalized gait pattern and </= 2/10 back and hip pain    Time 8    Period Weeks    Status Partially Met   pain with certain ADLs     PT LONG TERM GOAL #4   Title Lumbar ROM improved by at least 25 % in all directions with no greater than 2/10 pain    Time --    Period --    Status On-going      PT LONG TERM GOAL #5   Title FOTO improved to 60% to demonstrate improved functional mobility    Baseline 04/22/21: 36%. 05/27/21: 46%    Time --    Period --    Status On-going                   Plan - 05/27/21 1339     Clinical  Impression Statement Pt is making good progress towards goals. Pt continues to tolerate progression of exercises well. Hip pain has pretty much resolved but she has been experiencing some persistent left side pain between lower ribs and top of ilium/pelvis which relieves with rest and with heat for a short period. Overall she reports she feels PT has helped for hip but still feels pretty limited with mobility d/t the pain in her side. Some relief with gentle stretch but does exacerbate some back pain. Discussed today continuation of exercises at home and plan to follow up with MD if this side pain continues to eprsist unchanged.    Personal Factors and Comorbidities Past/Current Experience    Examination-Activity Limitations Locomotion Level;Bed Mobility;Bend;Carry;Stand;Lift    Stability/Clinical Decision Making Evolving/Moderate complexity    Rehab Potential Fair    PT Frequency 2x / week    PT Duration 8 weeks    PT Treatment/Interventions ADLs/Self Care Home Management;Cryotherapy;Electrical Stimulation;Iontophoresis 43m/ml Dexamethasone;Moist Heat;Neuromuscular re-education;Balance training;Therapeutic exercise;Therapeutic activities;Functional mobility training;Patient/family education;Manual techniques;Energy conservation;Dry needling;Taping;Vestibular    PT Next Visit Plan core strength, modalities as needed             Patient will benefit from skilled therapeutic intervention in order to improve the following deficits and impairments:  Abnormal gait, Difficulty walking, Increased muscle spasms, Decreased endurance, Pain, Improper body mechanics, Decreased strength, Decreased mobility  Visit Diagnosis: Pain in right hip  Pain in left hip  Cramp and spasm  Acute low back pain, unspecified back pain laterality, unspecified whether sciatica present     Problem List Patient Active Problem List   Diagnosis Date Noted   Pruritus 02/28/2021   Movement disorder 08/26/2020   Stress  06/11/2020   Dyslipidemia 04/30/2020   Hyperglycemia 04/30/2020  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   Pelvic pain 08/10/2019   Rhinorrhea 02/16/2019   Sleep-disordered breathing 02/16/2019   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   Osteopenia 05/03/2018   Allergic rhinitis 04/01/2018   Insomnia 03/01/2018   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   Urinary incontinence 01/18/2018   LOW BACK PAIN, CHRONIC 12/12/2007   DIVERTICULOSIS, COLON W/O HEM 08/25/2007   GERD 04/15/2007    Hall Busing, PT, DPT 05/27/2021, 1:44 PM  [place]. [place], [place], [address] Phone: [phone]   Fax:  [phone]  Name: Maleeka Sabatino MRN: [id number] Date of Birth: July 10, 1951

## 2021-05-30 ENCOUNTER — Other Ambulatory Visit: Payer: Self-pay

## 2021-05-30 ENCOUNTER — Ambulatory Visit: Payer: Medicare HMO | Admitting: Physical Therapy

## 2021-05-30 ENCOUNTER — Encounter: Payer: Self-pay | Admitting: Physical Therapy

## 2021-05-30 DIAGNOSIS — M25551 Pain in right hip: Secondary | ICD-10-CM

## 2021-05-30 DIAGNOSIS — M545 Low back pain, unspecified: Secondary | ICD-10-CM | POA: Diagnosis not present

## 2021-05-30 DIAGNOSIS — M25552 Pain in left hip: Secondary | ICD-10-CM

## 2021-05-30 DIAGNOSIS — R252 Cramp and spasm: Secondary | ICD-10-CM | POA: Diagnosis not present

## 2021-05-30 NOTE — Therapy (Signed)
[place]. [place], [place], [address] Phone: [phone]   Fax:  [phone]  Physical Therapy Treatment  Patient Details  Name: Lindsay Reyes MRN: [id number] Date of Birth: [birth year] Referring Provider (PT): Georgina Snell   Encounter Date: 05/30/2021   PT End of Session - 05/30/21 1149     Visit Number 11    Number of Visits 17    Date for PT Re-Evaluation 06/24/21    Authorization Type [employer]    PT Start Time 1058    PT Stop Time 1141    PT Time Calculation (min) 43 min    Activity Tolerance Patient tolerated treatment well    Behavior During Therapy [facility] for tasks assessed/performed             Past Medical History:  Diagnosis Date   Arthritis    Colon polyp    Depression    Diverticulosis    GERD (gastroesophageal reflux disease)    Hyperlipidemia    Insomnia    Retinal micro-aneurysm of right eye    Scoliosis     Past Surgical History:  Procedure Laterality Date   ABDOMINAL HYSTERECTOMY  2002   COLONOSCOPY      There were no vitals filed for this visit.   Subjective Assessment - 05/30/21 1100     Subjective Pt is having no pain yet today but typically has pain later in the day. Her pain is still on the left side and slightly forward.    Pain Score 0-No pain   side of trunk on left, and slightly front   Aggravating Factors  sitting    Pain Relieving Factors laying down                               [facility] Adult PT Treatment/Exercise - 05/30/21 0001       Lumbar Exercises: Stretches   Single Knee to Chest Stretch Right;Left;30 seconds;1 rep    Piriformis Stretch 2 reps;30 seconds      Lumbar Exercises: Seated   Sit to Stand 10 reps   x2 OHP w/ yellow ball   Other Seated Lumbar Exercises abdominal bracing with pball 2x10 holding 5 sec, diagnoal with yellow ball 2x12 (cues for slowing down)      Lumbar Exercises: Supine   Other Supine Lumbar Exercises supine  marching x2 (one set with green theraband around thighs)      Lumbar Exercises: Sidelying   Clam 20 reps;Right;Left   green theraband, tactile cues for keeping hip forward     Knee/Hip Exercises: Aerobic   Nustep L5 x 6 min      Knee/Hip Exercises: Standing   Hip Abduction Right;Left;2 sets;Knee straight;10 reps   red band   Hip Extension 2 sets;15 reps;Knee straight;Left   green theraband     Manual Therapy   Manual Therapy Soft tissue mobilization    Manual therapy comments Trigger point release to L lumbar and glut med area                      PT Short Term Goals - 05/07/21 1710       PT SHORT TERM GOAL #1   Title Independent with initial HEP    Status Achieved      PT SHORT TERM GOAL #2   Title Pt will report improved sitting and standing tolerance with </=4/10 pain in back and  hips    Status Partially Met               PT Long Term Goals - 05/27/21 1311       PT LONG TERM GOAL #1   Title Pt will be independent with advanced HEP and will return to independent gym program    Time 8    Period Weeks    Status On-going      PT LONG TERM GOAL #2   Title Pt will complete 5TSTS in at least 10 seconds without UE support and with </= 2/10 pain to demosntrate improved functional LE strength    Time 8    Period Weeks    Status Achieved      PT LONG TERM GOAL #3   Title Pt will ambulate with normalized gait pattern and </= 2/10 back and hip pain    Time 8    Period Weeks    Status Partially Met   pain with certain ADLs     PT LONG TERM GOAL #4   Title Lumbar ROM improved by at least 25 % in all directions with no greater than 2/10 pain    Time --    Period --    Status On-going      PT LONG TERM GOAL #5   Title FOTO improved to 60% to demonstrate improved functional mobility    Baseline 04/22/21: 36%. 05/27/21: 46%    Time --    Period --    Status On-going                   Plan - 05/30/21 1104     Clinical Impression Statement Pt  is doing well she wants to be challenged more in therapy and is hesitant to go back to the doctor. She came in with no pain but said she will start to feel the pain later in the day. Pt tolerated all exercises well and continued to progress hip and core strength. Soft tissue moblization was done to L lumbar/glut med area. Pt was very tender throughout. She reported feeling well following treatment.    PT Treatment/Interventions ADLs/Self Care Home Management;Cryotherapy;Electrical Stimulation;Iontophoresis 46m/ml Dexamethasone;Moist Heat;Neuromuscular re-education;Balance training;Therapeutic exercise;Therapeutic activities;Functional mobility training;Patient/family education;Manual techniques;Energy conservation;Dry needling;Taping;Vestibular    PT Next Visit Plan core strength, modalities as needed    PT Home Exercise Plan see pt edu             Patient will benefit from skilled therapeutic intervention in order to improve the following deficits and impairments:  Abnormal gait, Difficulty walking, Increased muscle spasms, Decreased endurance, Pain, Improper body mechanics, Decreased strength, Decreased mobility  Visit Diagnosis: Pain in right hip  Pain in left hip  Cramp and spasm  Acute low back pain, unspecified back pain laterality, unspecified whether sciatica present     Problem List Patient Active Problem List   Diagnosis Date Noted   Pruritus 02/28/2021   Movement disorder 08/26/2020   Stress 06/11/2020   Dyslipidemia 04/30/2020   Hyperglycemia 04/30/2020   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   Pelvic pain 08/10/2019   Rhinorrhea 02/16/2019   Sleep-disordered breathing 02/16/2019   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   Osteopenia 05/03/2018   Allergic rhinitis 04/01/2018   Insomnia 03/01/2018   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   Urinary incontinence 01/18/2018   LOW BACK PAIN, CHRONIC 12/12/2007   DIVERTICULOSIS, COLON W/O HEM 08/25/2007   GERD 04/15/2007     EDawayne Cirri SPTA 05/30/2021, 11:50 AM  [place]  [place]. [place], [place], [address] Phone: [phone]   Fax:  [phone]  Name: Lindsay Reyes MRN: [id number] Date of Birth: 11/12/1951

## 2021-06-03 ENCOUNTER — Ambulatory Visit: Payer: Medicare HMO | Admitting: Physical Therapy

## 2021-06-03 ENCOUNTER — Encounter: Payer: Self-pay | Admitting: Physical Therapy

## 2021-06-03 ENCOUNTER — Other Ambulatory Visit: Payer: Self-pay

## 2021-06-03 DIAGNOSIS — M25551 Pain in right hip: Secondary | ICD-10-CM | POA: Diagnosis not present

## 2021-06-03 DIAGNOSIS — M545 Low back pain, unspecified: Secondary | ICD-10-CM | POA: Diagnosis not present

## 2021-06-03 DIAGNOSIS — M25552 Pain in left hip: Secondary | ICD-10-CM

## 2021-06-03 DIAGNOSIS — R252 Cramp and spasm: Secondary | ICD-10-CM | POA: Diagnosis not present

## 2021-06-03 NOTE — Therapy (Signed)
[place]. [place], [place], [address] Phone: [phone]   Fax:  [phone]  Physical Therapy Treatment  Patient Details  Name: Lindsay Reyes MRN: [id number] Date of Birth: [birth year] Referring Provider (PT): Lindsay Reyes   Encounter Date: 06/03/2021   PT End of Session - 06/03/21 1641     Visit Number 12    PT Start Time 2229    PT Stop Time 7989    PT Time Calculation (min) 40 min    Activity Tolerance Patient tolerated treatment well    Behavior During Therapy [facility] for tasks assessed/performed             Past Medical History:  Diagnosis Date   Arthritis    Colon polyp    Depression    Diverticulosis    GERD (gastroesophageal reflux disease)    Hyperlipidemia    Insomnia    Retinal micro-aneurysm of right eye    Scoliosis     Past Surgical History:  Procedure Laterality Date   ABDOMINAL HYSTERECTOMY  2002   COLONOSCOPY      There were no vitals filed for this visit.   Subjective Assessment - 06/03/21 1515     Subjective Pt still c/o increase in L side/LB pain. Pain worse later in the day.    Currently in Pain? Yes    Pain Score 2     Pain Location Flank    Pain Orientation Left                               [facility] Adult PT Treatment/Exercise - 06/03/21 0001       Lumbar Exercises: Stretches   Gastroc Stretch Right;Left;1 rep;20 seconds      Lumbar Exercises: Machines for Strengthening   Cybex Knee Extension 15# 3x10    Cybex Knee Flexion 25# 3x10    Leg Press 40# 3x10    Other Lumbar Machine Exercise rows and lats 25# 3x10    Other Lumbar Machine Exercise standing shoulder ext 10# 3x10; AR press 10# 2x10 B      Lumbar Exercises: Standing   Heel Raises 15 reps    Other Standing Lumbar Exercises resisted gait 30# x4 each direction      Knee/Hip Exercises: Aerobic   Nustep L5 x 6 min                      PT Short Term Goals - 05/07/21 1710        PT SHORT TERM GOAL #1   Title Independent with initial HEP    Status Achieved      PT SHORT TERM GOAL #2   Title Pt will report improved sitting and standing tolerance with </=4/10 pain in back and hips    Status Partially Met               PT Long Term Goals - 05/27/21 1311       PT LONG TERM GOAL #1   Title Pt will be independent with advanced HEP and will return to independent gym program    Time 8    Period Weeks    Status On-going      PT LONG TERM GOAL #2   Title Pt will complete 5TSTS in at least 10 seconds without UE support and with </= 2/10 pain to demosntrate improved functional LE strength    Time 8  Period Weeks    Status Achieved      PT LONG TERM GOAL #3   Title Pt will ambulate with normalized gait pattern and </= 2/10 back and hip pain    Time 8    Period Weeks    Status Partially Met   pain with certain ADLs     PT LONG TERM GOAL #4   Title Lumbar ROM improved by at least 25 % in all directions with no greater than 2/10 pain    Time --    Period --    Status On-going      PT LONG TERM GOAL #5   Title FOTO improved to 60% to demonstrate improved functional mobility    Baseline 04/22/21: 36%. 05/27/21: 46%    Time --    Period --    Status On-going                   Plan - 06/03/21 1641     Clinical Impression Statement Pt presents to clinic requesting more challeging therapy; states she would like to increase weights, reps, and add in core ex's. Demos increased fatigue throughout session and required stretching breaks b/n sets. Denies increase in LBP/R hip pain/flank pain with exercise. Reinforced previous education regarding returning to MD if flank pain does not resolve; pt hesitant to go back to MD. Continue to progress to tolerance.    PT Treatment/Interventions ADLs/Self Care Home Management;Cryotherapy;Electrical Stimulation;Iontophoresis 41m/ml Dexamethasone;Moist Heat;Neuromuscular re-education;Balance training;Therapeutic  exercise;Therapeutic activities;Functional mobility training;Patient/family education;Manual techniques;Energy conservation;Dry needling;Taping;Vestibular    PT Next Visit Plan core strength, modalities as needed    Consulted and Agree with Plan of Care Patient             Patient will benefit from skilled therapeutic intervention in order to improve the following deficits and impairments:  Abnormal gait, Difficulty walking, Increased muscle spasms, Decreased endurance, Pain, Improper body mechanics, Decreased strength, Decreased mobility  Visit Diagnosis: Pain in right hip  Pain in left hip  Cramp and spasm  Acute low back pain, unspecified back pain laterality, unspecified whether sciatica present     Problem List Patient Active Problem List   Diagnosis Date Noted   Pruritus 02/28/2021   Movement disorder 08/26/2020   Stress 06/11/2020   Dyslipidemia 04/30/2020   Hyperglycemia 04/30/2020   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   Pelvic pain 08/10/2019   Rhinorrhea 02/16/2019   Sleep-disordered breathing 02/16/2019   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   Osteopenia 05/03/2018   Allergic rhinitis 04/01/2018   Insomnia 03/01/2018   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   Urinary incontinence 01/18/2018   LOW BACK PAIN, CHRONIC 12/12/2007   DIVERTICULOSIS, COLON W/O HEM 08/25/2007   GERD 04/15/2007   AAmador Cunas PT, DPT ADonald ProseSugg 06/03/2021, 4:44 PM  [place] [address]Phone: [phone]  Fax:  [phone] Name: DLauriann MililloMRN: [id number] of Birth: [birth year]

## 2021-06-05 ENCOUNTER — Other Ambulatory Visit: Payer: Self-pay

## 2021-06-05 ENCOUNTER — Ambulatory Visit: Payer: Medicare HMO | Admitting: Physical Therapy

## 2021-06-05 ENCOUNTER — Encounter: Payer: Self-pay | Admitting: Physical Therapy

## 2021-06-05 DIAGNOSIS — M545 Low back pain, unspecified: Secondary | ICD-10-CM | POA: Diagnosis not present

## 2021-06-05 DIAGNOSIS — M25551 Pain in right hip: Secondary | ICD-10-CM | POA: Diagnosis not present

## 2021-06-05 DIAGNOSIS — R252 Cramp and spasm: Secondary | ICD-10-CM | POA: Diagnosis not present

## 2021-06-05 DIAGNOSIS — M25552 Pain in left hip: Secondary | ICD-10-CM

## 2021-06-05 NOTE — Therapy (Signed)
[place]. [place], [place], [address] Phone: [phone]   Fax:  [phone]  Physical Therapy Treatment  Patient Details  Name: Lindsay Reyes MRN: [id number] Date of Birth: 12/26/1950 Referring Provider (PT): Marikay Alar Date: 06/05/2021   PT End of Session - 06/05/21 1440     Visit Number 13    Number of Visits 17    Date for PT Re-Evaluation 06/24/21    Authorization Type [employer]    PT Start Time 0146    PT Stop Time 0226    PT Time Calculation (min) 40 min    Behavior During Therapy [facility] for tasks assessed/performed             Past Medical History:  Diagnosis Date   Arthritis    Colon polyp    Depression    Diverticulosis    GERD (gastroesophageal reflux disease)    Hyperlipidemia    Insomnia    Retinal micro-aneurysm of right eye    Scoliosis     Past Surgical History:  Procedure Laterality Date   ABDOMINAL HYSTERECTOMY  2002   COLONOSCOPY      There were no vitals filed for this visit.   Subjective Assessment - 06/05/21 1349     Subjective Pt still has pain when going out. She can only go out for about an hour.    Currently in Pain? No/denies                               [facility] Adult PT Treatment/Exercise - 06/05/21 0001       High Level Balance   High Level Balance Comments Marching on airex 2x10, B SLS 2x20 sec      Lumbar Exercises: Standing   Other Standing Lumbar Exercises resisted gait 30# x5 fwd/bwd    Other Standing Lumbar Exercises AR press 10b x10, AR press with rotation x10      Lumbar Exercises: Supine   Bridge 20 reps   feet on pball   Other Supine Lumbar Exercises double leg toe tap 2x10   cues for pelvic tilt   Other Supine Lumbar Exercises trunk rotation feet on the pball x10      Lumbar Exercises: Quadruped   Opposite Arm/Leg Raise 5 reps;Right arm/Left leg;Left arm/Right leg   1 set just legs x10, pt reports pain in wrist      Knee/Hip Exercises: Aerobic   Recumbent Bike L1.5 x 6 min      Knee/Hip Exercises: Standing   Other Standing Knee Exercises hip hinge with bar x5, deadlift 5lbs each hand 2x10      Knee/Hip Exercises: Seated   Sit to Sand 2 sets;20 reps   5 lbs                     PT Short Term Goals - 06/05/21 1437       PT SHORT TERM GOAL #1   Title Independent with initial HEP    Time 2    Period Weeks    Status Achieved    Target Date 05/06/21      PT SHORT TERM GOAL #2   Title Pt will report improved sitting and standing tolerance with </=4/10 pain in back and hips    Time 5    Period Weeks    Status Partially Met  PT Long Term Goals - 06/05/21 1439       PT LONG TERM GOAL #1   Title Pt will be independent with advanced HEP and will return to independent gym program    Time 8    Period Weeks    Status On-going      PT LONG TERM GOAL #2   Title Pt will complete 5TSTS in at least 10 seconds without UE support and with </= 2/10 pain to demosntrate improved functional LE strength    Time 8    Period Weeks    Status Achieved      PT LONG TERM GOAL #3   Title Pt will ambulate with normalized gait pattern and </= 2/10 back and hip pain    Time 8    Period Weeks    Status Partially Met      PT LONG TERM GOAL #4   Title Lumbar ROM improved by at least 25 % in all directions with no greater than 2/10 pain    Time 8    Period Weeks    Status On-going      PT LONG TERM GOAL #5   Title FOTO improved to 60% to demonstrate improved functional mobility    Baseline 04/22/21: 36%. 05/27/21: 46%    Time 8    Period Weeks    Status On-going                   Plan - 06/05/21 1432     Clinical Impression Statement Pt came into clinic feeling well. She has no pain but reports she has pain while out shopping, and when bending, lifting and twisting. Tolerated all strengthening exercises well. During balance activities pt required min assistance with  frequent LOB. She required min assistance during quadraped exercises and it caused some wrist pain. Educated pt on hip hinging while lifting objects. She had no increase in pain throughout treatment.    PT Treatment/Interventions ADLs/Self Care Home Management;Cryotherapy;Electrical Stimulation;Iontophoresis 52m/ml Dexamethasone;Moist Heat;Neuromuscular re-education;Balance training;Therapeutic exercise;Therapeutic activities;Functional mobility training;Patient/family education;Manual techniques;Energy conservation;Dry needling;Taping;Vestibular    PT Next Visit Plan balance, core strength, modalities as needed    PT Home Exercise Plan see pt edu    Consulted and Agree with Plan of Care Patient             Patient will benefit from skilled therapeutic intervention in order to improve the following deficits and impairments:  Abnormal gait, Difficulty walking, Increased muscle spasms, Decreased endurance, Pain, Improper body mechanics, Decreased strength, Decreased mobility  Visit Diagnosis: Pain in right hip  Pain in left hip  Cramp and spasm  Acute low back pain, unspecified back pain laterality, unspecified whether sciatica present     Problem List Patient Active Problem List   Diagnosis Date Noted   Pruritus 02/28/2021   Movement disorder 08/26/2020   Stress 06/11/2020   Dyslipidemia 04/30/2020   Hyperglycemia 04/30/2020   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   Pelvic pain 08/10/2019   Rhinorrhea 02/16/2019   Sleep-disordered breathing 02/16/2019   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   Osteopenia 05/03/2018   Allergic rhinitis 04/01/2018   Insomnia 03/01/2018   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   Urinary incontinence 01/18/2018   LOW BACK PAIN, CHRONIC 12/12/2007   DIVERTICULOSIS, COLON W/O HEM 08/25/2007   GERD 04/15/2007    EDawayne Cirri SPTA 06/05/2021, 2:42 PM  [place]  [address]Phone: [phone]  Fax:  [phone]  Name: Lindsay Reyes MRN: [id number] Date of Birth: March 15, 1951

## 2021-06-10 ENCOUNTER — Encounter: Payer: Self-pay | Admitting: Physical Therapy

## 2021-06-10 ENCOUNTER — Other Ambulatory Visit: Payer: Self-pay

## 2021-06-10 ENCOUNTER — Ambulatory Visit: Payer: Medicare HMO | Attending: Family Medicine | Admitting: Physical Therapy

## 2021-06-10 DIAGNOSIS — M545 Low back pain, unspecified: Secondary | ICD-10-CM | POA: Diagnosis not present

## 2021-06-10 DIAGNOSIS — M25552 Pain in left hip: Secondary | ICD-10-CM | POA: Insufficient documentation

## 2021-06-10 DIAGNOSIS — M25551 Pain in right hip: Secondary | ICD-10-CM | POA: Insufficient documentation

## 2021-06-10 DIAGNOSIS — R252 Cramp and spasm: Secondary | ICD-10-CM | POA: Diagnosis not present

## 2021-06-10 NOTE — Therapy (Signed)
[place]. [place], [place], [address] Phone: [phone]   Fax:  [phone]  Physical Therapy Treatment  Patient Details  Name: Lindsay Reyes MRN: [id number] Date of Birth: Nov 07, 1951 Referring Provider (PT): Marikay Alar Date: 06/10/2021   PT End of Session - 06/10/21 1222     Visit Number 14    Number of Visits 17    Date for PT Re-Evaluation 06/24/21    PT Start Time 0321    PT Stop Time 1229    PT Time Calculation (min) 44 min    Activity Tolerance Patient tolerated treatment well    Behavior During Therapy [facility] for tasks assessed/performed             Past Medical History:  Diagnosis Date   Arthritis    Colon polyp    Depression    Diverticulosis    GERD (gastroesophageal reflux disease)    Hyperlipidemia    Insomnia    Retinal micro-aneurysm of right eye    Scoliosis     Past Surgical History:  Procedure Laterality Date   ABDOMINAL HYSTERECTOMY  2002   COLONOSCOPY      There were no vitals filed for this visit.   Subjective Assessment - 06/10/21 1148     Subjective No too bad, no pain since last session, but has not done anything. Some pain in L his/sides yesterday going to the store    Currently in Pain? No/denies                               [facility] Adult PT Treatment/Exercise - 06/10/21 0001       Lumbar Exercises: Stretches   Passive Hamstring Stretch Right;Left;4 reps;20 seconds    Single Knee to Chest Stretch Right;Left;30 seconds;1 rep    ITB Stretch Left;Right;3 reps;20 seconds    Other Lumbar Stretch Exercise trunk flexion pball x10      Lumbar Exercises: Machines for Strengthening   Cybex Knee Extension 10# 2x10    Cybex Knee Flexion 20# 2x10    Other Lumbar Machine Exercise rows and lats 20# 2x10      Lumbar Exercises: Standing   Other Standing Lumbar Exercises resisted gait 20# x10 lateral over 1/2 foam roller    Other Standing Lumbar  Exercises shoulder ext 2x10 10#      Knee/Hip Exercises: Aerobic   Elliptical L1 x 2 min    Recumbent Bike L2.4 x 5 min      Knee/Hip Exercises: Standing   Other Standing Knee Exercises lateral step up 8" x10      Knee/Hip Exercises: Seated   Sit to Sand 2 sets   x12 blue ball                     PT Short Term Goals - 06/05/21 1437       PT SHORT TERM GOAL #1   Title Independent with initial HEP    Time 2    Period Weeks    Status Achieved    Target Date 05/06/21      PT SHORT TERM GOAL #2   Title Pt will report improved sitting and standing tolerance with </=4/10 pain in back and hips    Time 5    Period Weeks    Status Partially Met  PT Long Term Goals - 06/05/21 1439       PT LONG TERM GOAL #1   Title Pt will be independent with advanced HEP and will return to independent gym program    Time 8    Period Weeks    Status On-going      PT LONG TERM GOAL #2   Title Pt will complete 5TSTS in at least 10 seconds without UE support and with </= 2/10 pain to demosntrate improved functional LE strength    Time 8    Period Weeks    Status Achieved      PT LONG TERM GOAL #3   Title Pt will ambulate with normalized gait pattern and </= 2/10 back and hip pain    Time 8    Period Weeks    Status Partially Met      PT LONG TERM GOAL #4   Title Lumbar ROM improved by at least 25 % in all directions with no greater than 2/10 pain    Time 8    Period Weeks    Status On-going      PT LONG TERM GOAL #5   Title FOTO improved to 60% to demonstrate improved functional mobility    Baseline 04/22/21: 36%. 05/27/21: 46%    Time 8    Period Weeks    Status On-going                   Plan - 06/10/21 1222     Clinical Impression Statement Added elliptical to aerobic warm up, pt only able to tolerated one minute due to LE fatigue. Started session with function and standing interventions. She did report some low back tightness with sit to  stands, that was relieved after Pball stretching. Cues to complete the full ROM with machine level LE strengthening. Some instability noted with resisted side step over foam roll.    Personal Factors and Comorbidities Past/Current Experience    Examination-Activity Limitations Locomotion Level;Bed Mobility;Bend;Carry;Stand;Lift    Stability/Clinical Decision Making Evolving/Moderate complexity    Rehab Potential Fair    PT Frequency 2x / week    PT Duration 8 weeks    PT Treatment/Interventions ADLs/Self Care Home Management;Cryotherapy;Electrical Stimulation;Iontophoresis 80m/ml Dexamethasone;Moist Heat;Neuromuscular re-education;Balance training;Therapeutic exercise;Therapeutic activities;Functional mobility training;Patient/family education;Manual techniques;Energy conservation;Dry needling;Taping;Vestibular    PT Next Visit Plan balance, core strength, modalities as needed             Patient will benefit from skilled therapeutic intervention in order to improve the following deficits and impairments:  Abnormal gait, Difficulty walking, Increased muscle spasms, Decreased endurance, Pain, Improper body mechanics, Decreased strength, Decreased mobility  Visit Diagnosis: Pain in right hip  Pain in left hip  Cramp and spasm  Acute low back pain, unspecified back pain laterality, unspecified whether sciatica present     Problem List Patient Active Problem List   Diagnosis Date Noted   Pruritus 02/28/2021   Movement disorder 08/26/2020   Stress 06/11/2020   Dyslipidemia 04/30/2020   Hyperglycemia 04/30/2020   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   Pelvic pain 08/10/2019   Rhinorrhea 02/16/2019   Sleep-disordered breathing 02/16/2019   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   Osteopenia 05/03/2018   Allergic rhinitis 04/01/2018   Insomnia 03/01/2018   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   Urinary incontinence 01/18/2018   LOW BACK PAIN, CHRONIC 12/12/2007   DIVERTICULOSIS, COLON  W/O HEM 08/25/2007   GERD 04/15/2007    RScot Jun7/04/2021, 12:27 PM  [facility]  [place]. [place], [place], [address] Phone: [phone]   Fax:  [phone]  Name: Lindsay Reyes MRN: [id number] Date of Birth: [birth year]

## 2021-06-12 ENCOUNTER — Other Ambulatory Visit: Payer: Self-pay

## 2021-06-12 ENCOUNTER — Ambulatory Visit: Payer: Medicare HMO | Admitting: Physical Therapy

## 2021-06-12 ENCOUNTER — Encounter: Payer: Self-pay | Admitting: Physical Therapy

## 2021-06-12 DIAGNOSIS — M25551 Pain in right hip: Secondary | ICD-10-CM | POA: Diagnosis not present

## 2021-06-12 DIAGNOSIS — R252 Cramp and spasm: Secondary | ICD-10-CM | POA: Diagnosis not present

## 2021-06-12 DIAGNOSIS — M545 Low back pain, unspecified: Secondary | ICD-10-CM

## 2021-06-12 DIAGNOSIS — M25552 Pain in left hip: Secondary | ICD-10-CM

## 2021-06-12 NOTE — Therapy (Signed)
[place]. [place], [place], [address] Phone: [phone]   Fax:  [phone]  Physical Therapy Treatment  Patient Details  Name: Lindsay Reyes MRN: [id number] Date of Birth: September 18, 1951 Referring Provider (PT): Marikay Alar Date: 06/12/2021   PT End of Session - 06/12/21 1341     Visit Number 15    Date for PT Re-Evaluation 06/24/21    Authorization Type [employer]    Activity Tolerance Patient tolerated treatment well    Behavior During Therapy [facility] for tasks assessed/performed             Past Medical History:  Diagnosis Date   Arthritis    Colon polyp    Depression    Diverticulosis    GERD (gastroesophageal reflux disease)    Hyperlipidemia    Insomnia    Retinal micro-aneurysm of right eye    Scoliosis     Past Surgical History:  Procedure Laterality Date   ABDOMINAL HYSTERECTOMY  2002   COLONOSCOPY      There were no vitals filed for this visit.   Subjective Assessment - 06/12/21 1258     Subjective had some pain after last session, has resolved as time passed    Currently in Pain? Yes    Pain Score 4     Pain Location Flank    Pain Orientation Left                [facility] PT Assessment - 06/12/21 0001       AROM   AROM Assessment Site Lumbar    Lumbar Flexion WFL    Lumbar Extension WFL    Lumbar - Right Side Bend WFL    Lumbar - Left Side Bend WFL    Lumbar - Right Rotation limited 25%    Lumbar - Left Rotation limited 25%                           [facility] Adult PT Treatment/Exercise - 06/12/21 0001       Lumbar Exercises: Machines for Strengthening   Cybex Knee Extension 10# 2x10    Cybex Knee Flexion 20# 2x10    Other Lumbar Machine Exercise rows and lats 20# 2x12    Other Lumbar Machine Exercise standing shoulder ext 5lb 2x10      Lumbar Exercises: Standing   Other Standing Lumbar Exercises 20lb resisted side step x5 each    Other Standing  Lumbar Exercises Rev grip Rows 10lb 2x10      Lumbar Exercises: Seated   Sit to Stand 10 reps   x2     Lumbar Exercises: Supine   Other Supine Lumbar Exercises feet on ball K2C, bridges, ortations      Knee/Hip Exercises: Aerobic   Nustep L5 x 6 min      Knee/Hip Exercises: Standing   Forward Step Up Both;1 set;10 reps;Hand Hold: 0;Step Height: 6"   4lb dumbbell                     PT Short Term Goals - 06/05/21 1437       PT SHORT TERM GOAL #1   Title Independent with initial HEP    Time 2    Period Weeks    Status Achieved    Target Date 05/06/21      PT SHORT TERM GOAL #2   Title Pt will report improved sitting and standing  tolerance with </=4/10 pain in back and hips    Time 5    Period Weeks    Status Partially Met               PT Long Term Goals - 06/12/21 1306       PT LONG TERM GOAL #1   Title Pt will be independent with advanced HEP and will return to independent gym program      PT LONG TERM GOAL #2   Title Pt will complete 5TSTS in at least 10 seconds without UE support and with </= 2/10 pain to demosntrate improved functional LE strength    Status Achieved      PT LONG TERM GOAL #3   Title Pt will ambulate with normalized gait pattern and </= 2/10 back and hip pain    Status Partially Met      PT LONG TERM GOAL #4   Title Lumbar ROM improved by at least 25 % in all directions with no greater than 2/10 pain    Status Achieved                   Plan - 06/12/21 1342     Clinical Impression Statement Pt has progressed increasing her lumber ROM. Pt did report some pain after last session to intervention backed down. Tactile cues for posture needed with rev rows and with shoulder extensions. Cues given to rest between sets to allow body to recover,    Personal Factors and Comorbidities Past/Current Experience    Examination-Activity Limitations Locomotion Level;Bed Mobility;Bend;Carry;Stand;Lift    Stability/Clinical Decision  Making Evolving/Moderate complexity    Rehab Potential Fair    PT Frequency 2x / week    PT Duration 8 weeks    PT Treatment/Interventions ADLs/Self Care Home Management;Cryotherapy;Electrical Stimulation;Iontophoresis 10m/ml Dexamethasone;Moist Heat;Neuromuscular re-education;Balance training;Therapeutic exercise;Therapeutic activities;Functional mobility training;Patient/family education;Manual techniques;Energy conservation;Dry needling;Taping;Vestibular    PT Next Visit Plan balance, core strength, modalities as needed             Patient will benefit from skilled therapeutic intervention in order to improve the following deficits and impairments:  Abnormal gait, Difficulty walking, Increased muscle spasms, Decreased endurance, Pain, Improper body mechanics, Decreased strength, Decreased mobility  Visit Diagnosis: Pain in right hip  Cramp and spasm  Acute low back pain, unspecified back pain laterality, unspecified whether sciatica present  Pain in left hip     Problem List Patient Active Problem List   Diagnosis Date Noted   Pruritus 02/28/2021   Movement disorder 08/26/2020   Stress 06/11/2020   Dyslipidemia 04/30/2020   Hyperglycemia 04/30/2020   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   Pelvic pain 08/10/2019   Rhinorrhea 02/16/2019   Sleep-disordered breathing 02/16/2019   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   Osteopenia 05/03/2018   Allergic rhinitis 04/01/2018   Insomnia 03/01/2018   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   Urinary incontinence 01/18/2018   LOW BACK PAIN, CHRONIC 12/12/2007   DIVERTICULOSIS, COLON W/O HEM 08/25/2007   GERD 04/15/2007    RScot Jun PTA 06/12/2021, 1:44 PM  [place] [address]Phone: [phone]  Fax:  [phone] Name: Lindsay ReagorMRN: [id number] of Birth: 5February 21, 1952

## 2021-06-17 ENCOUNTER — Ambulatory Visit: Payer: Medicare HMO

## 2021-06-19 ENCOUNTER — Ambulatory Visit: Payer: Medicare HMO

## 2021-06-19 ENCOUNTER — Other Ambulatory Visit: Payer: Self-pay

## 2021-06-19 DIAGNOSIS — R252 Cramp and spasm: Secondary | ICD-10-CM

## 2021-06-19 DIAGNOSIS — M25552 Pain in left hip: Secondary | ICD-10-CM | POA: Diagnosis not present

## 2021-06-19 DIAGNOSIS — M25551 Pain in right hip: Secondary | ICD-10-CM

## 2021-06-19 DIAGNOSIS — M545 Low back pain, unspecified: Secondary | ICD-10-CM | POA: Diagnosis not present

## 2021-06-19 NOTE — Therapy (Signed)
[place]. [place], [place], [address] Phone: [phone]   Fax:  [phone]  Physical Therapy Treatment  Patient Details  Name: Lindsay Reyes MRN: [id number] Date of Birth: 04/30/1951 Referring Provider (PT): Marikay Alar Date: 06/19/2021   PT End of Session - 06/19/21 1304     Visit Number 16    Date for PT Re-Evaluation 06/24/21    Authorization Type [employer]    PT Start Time 1300    PT Stop Time 4235    PT Time Calculation (min) 45 min    Activity Tolerance Patient tolerated treatment well    Behavior During Therapy [facility] for tasks assessed/performed             Past Medical History:  Diagnosis Date   Arthritis    Colon polyp    Depression    Diverticulosis    GERD (gastroesophageal reflux disease)    Hyperlipidemia    Insomnia    Retinal micro-aneurysm of right eye    Scoliosis     Past Surgical History:  Procedure Laterality Date   ABDOMINAL HYSTERECTOMY  2002   COLONOSCOPY      There were no vitals filed for this visit.   Subjective Assessment - 06/19/21 1302     Subjective no change better or worse, reports she plans on just completing last scheduled appointment then finishing with PT    Currently in Pain? Yes    Pain Score 4     Pain Location Flank    Pain Orientation Left                               [facility] Adult PT Treatment/Exercise - 06/19/21 0001       Lumbar Exercises: Machines for Strengthening   Cybex Knee Extension 10# 2x12    Cybex Knee Flexion 20# 2x12    Leg Press 40# 2 x 10    Other Lumbar Machine Exercise rows and lats 25# 2x10    Other Lumbar Machine Exercise standing shoulder ext 10# x 10. 5# x 15. Standing on airex low rows 5# x 10.      Lumbar Exercises: Standing   Other Standing Lumbar Exercises --    Other Standing Lumbar Exercises --      Lumbar Exercises: Seated   Sit to Stand --    Other Seated Lumbar Exercises ab bracing  red ball 5" x 10. modified SLR in sitting x 10 each      Lumbar Exercises: Supine   Other Supine Lumbar Exercises --    Other Supine Lumbar Exercises s/l hip abd x 10 B. S/l fowd and bwd in abd x10 in R s/l      Knee/Hip Exercises: Aerobic   Nustep L5 x 6 min      Knee/Hip Exercises: Standing   Forward Step Up Both;10 reps;Hand Hold: 0;Step Height: 6";2 sets   4lb dumbbell                     PT Short Term Goals - 06/05/21 1437       PT SHORT TERM GOAL #1   Title Independent with initial HEP    Time 2    Period Weeks    Status Achieved    Target Date 05/06/21      PT SHORT TERM GOAL #2   Title Pt will report improved sitting and standing  tolerance with </=4/10 pain in back and hips    Time 5    Period Weeks    Status Partially Met               PT Long Term Goals - 06/12/21 1306       PT LONG TERM GOAL #1   Title Pt will be independent with advanced HEP and will return to independent gym program      PT LONG TERM GOAL #2   Title Pt will complete 5TSTS in at least 10 seconds without UE support and with </= 2/10 pain to demosntrate improved functional LE strength    Status Achieved      PT LONG TERM GOAL #3   Title Pt will ambulate with normalized gait pattern and </= 2/10 back and hip pain    Status Partially Met      PT LONG TERM GOAL #4   Title Lumbar ROM improved by at least 25 % in all directions with no greater than 2/10 pain    Status Achieved                   Plan - 06/19/21 1354     Clinical Impression Statement Pt overall tolerating exercise progressions well, plan to reassess next visit. Tactile cues for posture and core engagement with standing and seated exercises. She does continue to present with tenderness and tightness along left low back into flank, including left gluts. She requests that last scheduled appointment which is next week be her last    Personal Factors and Comorbidities Past/Current Experience     Examination-Activity Limitations Locomotion Level;Bed Mobility;Bend;Carry;Stand;Lift    Stability/Clinical Decision Making Evolving/Moderate complexity    Rehab Potential Fair    PT Frequency 2x / week    PT Duration 8 weeks    PT Treatment/Interventions ADLs/Self Care Home Management;Cryotherapy;Electrical Stimulation;Iontophoresis 89m/ml Dexamethasone;Moist Heat;Neuromuscular re-education;Balance training;Therapeutic exercise;Therapeutic activities;Functional mobility training;Patient/family education;Manual techniques;Energy conservation;Dry needling;Taping;Vestibular    PT Next Visit Plan balance, core strength, modalities as needed. D/c to HEP next visit             Patient will benefit from skilled therapeutic intervention in order to improve the following deficits and impairments:  Abnormal gait, Difficulty walking, Increased muscle spasms, Decreased endurance, Pain, Improper body mechanics, Decreased strength, Decreased mobility  Visit Diagnosis: Pain in right hip  Cramp and spasm  Acute low back pain, unspecified back pain laterality, unspecified whether sciatica present  Pain in left hip     Problem List Patient Active Problem List   Diagnosis Date Noted   Pruritus 02/28/2021   Movement disorder 08/26/2020   Stress 06/11/2020   Dyslipidemia 04/30/2020   Hyperglycemia 04/30/2020   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   Pelvic pain 08/10/2019   Rhinorrhea 02/16/2019   Sleep-disordered breathing 02/16/2019   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   Osteopenia 05/03/2018   Allergic rhinitis 04/01/2018   Insomnia 03/01/2018   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   Urinary incontinence 01/18/2018   LOW BACK PAIN, CHRONIC 12/12/2007   DIVERTICULOSIS, COLON W/O HEM 08/25/2007   GERD 04/15/2007    MHall Busing PT, DPT 06/19/2021, 1:58 PM  [place] [address]Phone: [phone]   Fax:  [phone] Name: Lindsay AdcoxMRN: [id number] of Birth: [birth year]

## 2021-06-24 ENCOUNTER — Ambulatory Visit: Payer: Medicare HMO

## 2021-06-24 ENCOUNTER — Other Ambulatory Visit: Payer: Self-pay

## 2021-06-24 DIAGNOSIS — M25551 Pain in right hip: Secondary | ICD-10-CM | POA: Diagnosis not present

## 2021-06-24 DIAGNOSIS — M25552 Pain in left hip: Secondary | ICD-10-CM | POA: Diagnosis not present

## 2021-06-24 DIAGNOSIS — M545 Low back pain, unspecified: Secondary | ICD-10-CM | POA: Diagnosis not present

## 2021-06-24 DIAGNOSIS — R252 Cramp and spasm: Secondary | ICD-10-CM

## 2021-06-24 NOTE — Patient Instructions (Signed)
Access Code: [email or web address] URL: [email or web address] Date: 06/24/2021 Prepared by: Sherlynn Stalls  Exercises Bird Dog - 1 x daily - 7 x weekly - 2 sets - 10 reps Supine 90/90 Alternating Toe Touch - 1 x daily - 7 x weekly - 2 sets - 10 reps Bridge with Hip Abduction and Resistance - 1 x daily - 7 x weekly - 2 sets - 10 reps Hip Abduction with Resistance Loop - 1 x daily - 7 x weekly - 2 sets - 10 reps Marching with Resistance - 1 x daily - 7 x weekly - 2 sets - 10 reps Standing Shoulder Row with Anchored Resistance - 1 x daily - 7 x weekly - 2 sets - 15 reps Shoulder extension with resistance - Neutral - 1 x daily - 7 x weekly - 2 sets - 15 reps Sit to Stand with Resistance Around Legs - 1 x daily - 7 x weekly - 2 sets - 10 reps

## 2021-06-24 NOTE — Therapy (Signed)
[place]. [place], [place], [address] Phone: [phone]   Fax:  [phone]  Physical Therapy Treatment/ Discharge summary  Patient Details  Name: Lindsay Reyes MRN: [id number] Date of Birth: [birth year] Referring Provider (PT): Marikay Alar Date: 06/24/2021   PT End of Session - 06/24/21 1303     Visit Number 17    Date for PT Re-Evaluation 06/24/21    Authorization Type [employer]    PT Start Time 1300    PT Stop Time 1336    PT Time Calculation (min) 36 min    Activity Tolerance Patient tolerated treatment well    Behavior During Therapy [facility] for tasks assessed/performed             Past Medical History:  Diagnosis Date   Arthritis    Colon polyp    Depression    Diverticulosis    GERD (gastroesophageal reflux disease)    Hyperlipidemia    Insomnia    Retinal micro-aneurysm of right eye    Scoliosis     Past Surgical History:  Procedure Laterality Date   ABDOMINAL HYSTERECTOMY  2002   COLONOSCOPY      There were no vitals filed for this visit.   Subjective Assessment - 06/24/21 1303     Subjective "im fine right now" no change    Currently in Pain? No/denies                               [facility] Adult PT Treatment/Exercise - 06/24/21 0001       Knee/Hip Exercises: Aerobic   Nustep L6 x 6            Bird Dog -2 x 10 alternating  Supine 90/90 Alternating Toe Touch 2 x 10 alternating Bridge with Hip Abduction and Resistance -2 x 10 Hip Abduction with Resistance Loop - 2 x 10  Marching with Resistance - x 10 alternating with red loop Standing Shoulder Row with Anchored Resistance green - 2 x 15 Shoulder extension with resistance - Neutral - 2 x 15 Sit to Stand with Resistance Around Legs -x 10 with redloop at thighs  Trialed bridge with march but uncomfortable for back and discontinued         PT Education - 06/24/21 1339     Education Details  Discharge HE provided and reviewed. Access Code: [email or web address]    Person(s) Educated Patient    Methods Explanation;Demonstration;Handout    Comprehension Verbalized understanding;Returned demonstration              PT Short Term Goals - 06/05/21 1437       PT SHORT TERM GOAL #1   Title Independent with initial HEP    Time 2    Period Weeks    Status Achieved    Target Date 05/06/21      PT SHORT TERM GOAL #2   Title Pt will report improved sitting and standing tolerance with </=4/10 pain in back and hips    Time 5    Period Weeks    Status Partially Met               PT Long Term Goals - 06/12/21 1306       PT LONG TERM GOAL #1   Title Pt will be independent with advanced HEP and will return to independent gym program      PT LONG TERM  GOAL #2   Title Pt will complete 5TSTS in at least 10 seconds without UE support and with </= 2/10 pain to demosntrate improved functional LE strength    Status Achieved      PT LONG TERM GOAL #3   Title Pt will ambulate with normalized gait pattern and </= 2/10 back and hip pain    Status Partially Met      PT LONG TERM GOAL #4   Title Lumbar ROM improved by at least 25 % in all directions with no greater than 2/10 pain    Status Achieved                   Plan - 06/24/21 1340     Clinical Impression Statement Todays session with primary emphasis on reviewing and providing an updated discharge HEP. She toelrated all exercises well and was educated in safe ways to progress and modify exercises as needed. cues provided for bird dogs, required cues for pacing to work on core stab. Exercises provided with emphasis on continued  strengthening and stabilization.  Discussed if she needs any therapy in the future she may reach out again to clinic.    PT Next Visit Plan Discharge to HEP            Patient will benefit from skilled therapeutic intervention in order to improve the following deficits and impairments:  Abnormal  gait, Difficulty walking, Increased muscle spasms, Decreased endurance, Pain, Improper body mechanics, Decreased strength, Decreased mobility  Visit Diagnosis: Pain in right hip  Acute low back pain, unspecified back pain laterality, unspecified whether sciatica present  Cramp and spasm  Pain in left hip     Problem List Patient Active Problem List   Diagnosis Date Noted   Pruritus 02/28/2021   Movement disorder 08/26/2020   Stress 06/11/2020   Dyslipidemia 04/30/2020   Hyperglycemia 04/30/2020   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   Pelvic pain 08/10/2019   Rhinorrhea 02/16/2019   Sleep-disordered breathing 02/16/2019   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   Osteopenia 05/03/2018   Allergic rhinitis 04/01/2018   Insomnia 03/01/2018   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   Urinary incontinence 01/18/2018   LOW BACK PAIN, CHRONIC 12/12/2007   DIVERTICULOSIS, COLON W/O HEM 08/25/2007   GERD 04/15/2007   PHYSICAL THERAPY DISCHARGE SUMMARY  Visits from Start of Care: 17  Plan: Patient agrees to discharge.  Patient goals were partially  met. Patient is being discharged due to pt preferring to finish therapy at this time with plataeu/no change in pain back/flank pain.      Hall Busing, PT, DPT 06/24/2021, 1:45 PM  [place]. [place], [place], [address] Phone: [phone]   Fax:  [phone]  Name: Lindsay Reyes MRN: [id number] Date of Birth: March 19, 1951

## 2021-06-25 ENCOUNTER — Telehealth: Payer: Self-pay

## 2021-06-25 ENCOUNTER — Other Ambulatory Visit: Payer: Self-pay

## 2021-06-25 DIAGNOSIS — M8580 Other specified disorders of bone density and structure, unspecified site: Secondary | ICD-10-CM

## 2021-06-25 NOTE — Addendum Note (Signed)
Addended by: Clyde Lundborg A on: 06/25/2021 03:15 PM   Modules accepted: Orders

## 2021-06-25 NOTE — Telephone Encounter (Signed)
Pt called asking about her next bone density test. She thinks she is due for one in 2022 or 2023. There are currently no orders put in for a test.

## 2021-06-25 NOTE — Telephone Encounter (Signed)
Dexa has been ordered, pt due 04/2022. Called and made pt aware.

## 2021-07-04 ENCOUNTER — Telehealth: Payer: Self-pay

## 2021-07-04 NOTE — Telephone Encounter (Signed)
Please advise 

## 2021-07-04 NOTE — Telephone Encounter (Signed)
Patient called in wanting to know PCP thoughts about if a vibration plate for her lower back pain.

## 2021-07-04 NOTE — Telephone Encounter (Signed)
Not aware of any studies that show it helps with back pain though it probably wouldn't hurt I can't recommend it.  Algis Greenhouse. [name], MD 07/04/2021 4:08 PM

## 2021-07-09 ENCOUNTER — Other Ambulatory Visit: Payer: Self-pay | Admitting: Family Medicine

## 2021-07-11 ENCOUNTER — Telehealth: Payer: Self-pay

## 2021-07-11 NOTE — Telephone Encounter (Signed)
Pt called to check on the status of her prescription

## 2021-07-11 NOTE — Telephone Encounter (Signed)
Pt requesting rx refill for Zolpidem 10 mg tab LOV: 04/08/2021 No future visits scheduled Last refill: 06/11/2021  Approve?

## 2021-07-11 NOTE — Telephone Encounter (Signed)
LAST APPOINTMENT DATE:  04/08/21  NEXT APPOINTMENT DATE: None   MEDICATION:zolpidem (AMBIEN) 10 MG tablet  PHARMACY:[id number] [place]  [address]

## 2021-07-14 NOTE — Telephone Encounter (Signed)
Rx was send on 07/11/2021

## 2021-07-15 IMAGING — CR DG CHEST 2V
2 series · 2 of 2 positions shown · non-contrast
Comparison: 09/10/2013

CLINICAL DATA: Central chest pain for 30 minutes

EXAM:
CHEST - 2 VIEW

[w chest pa]
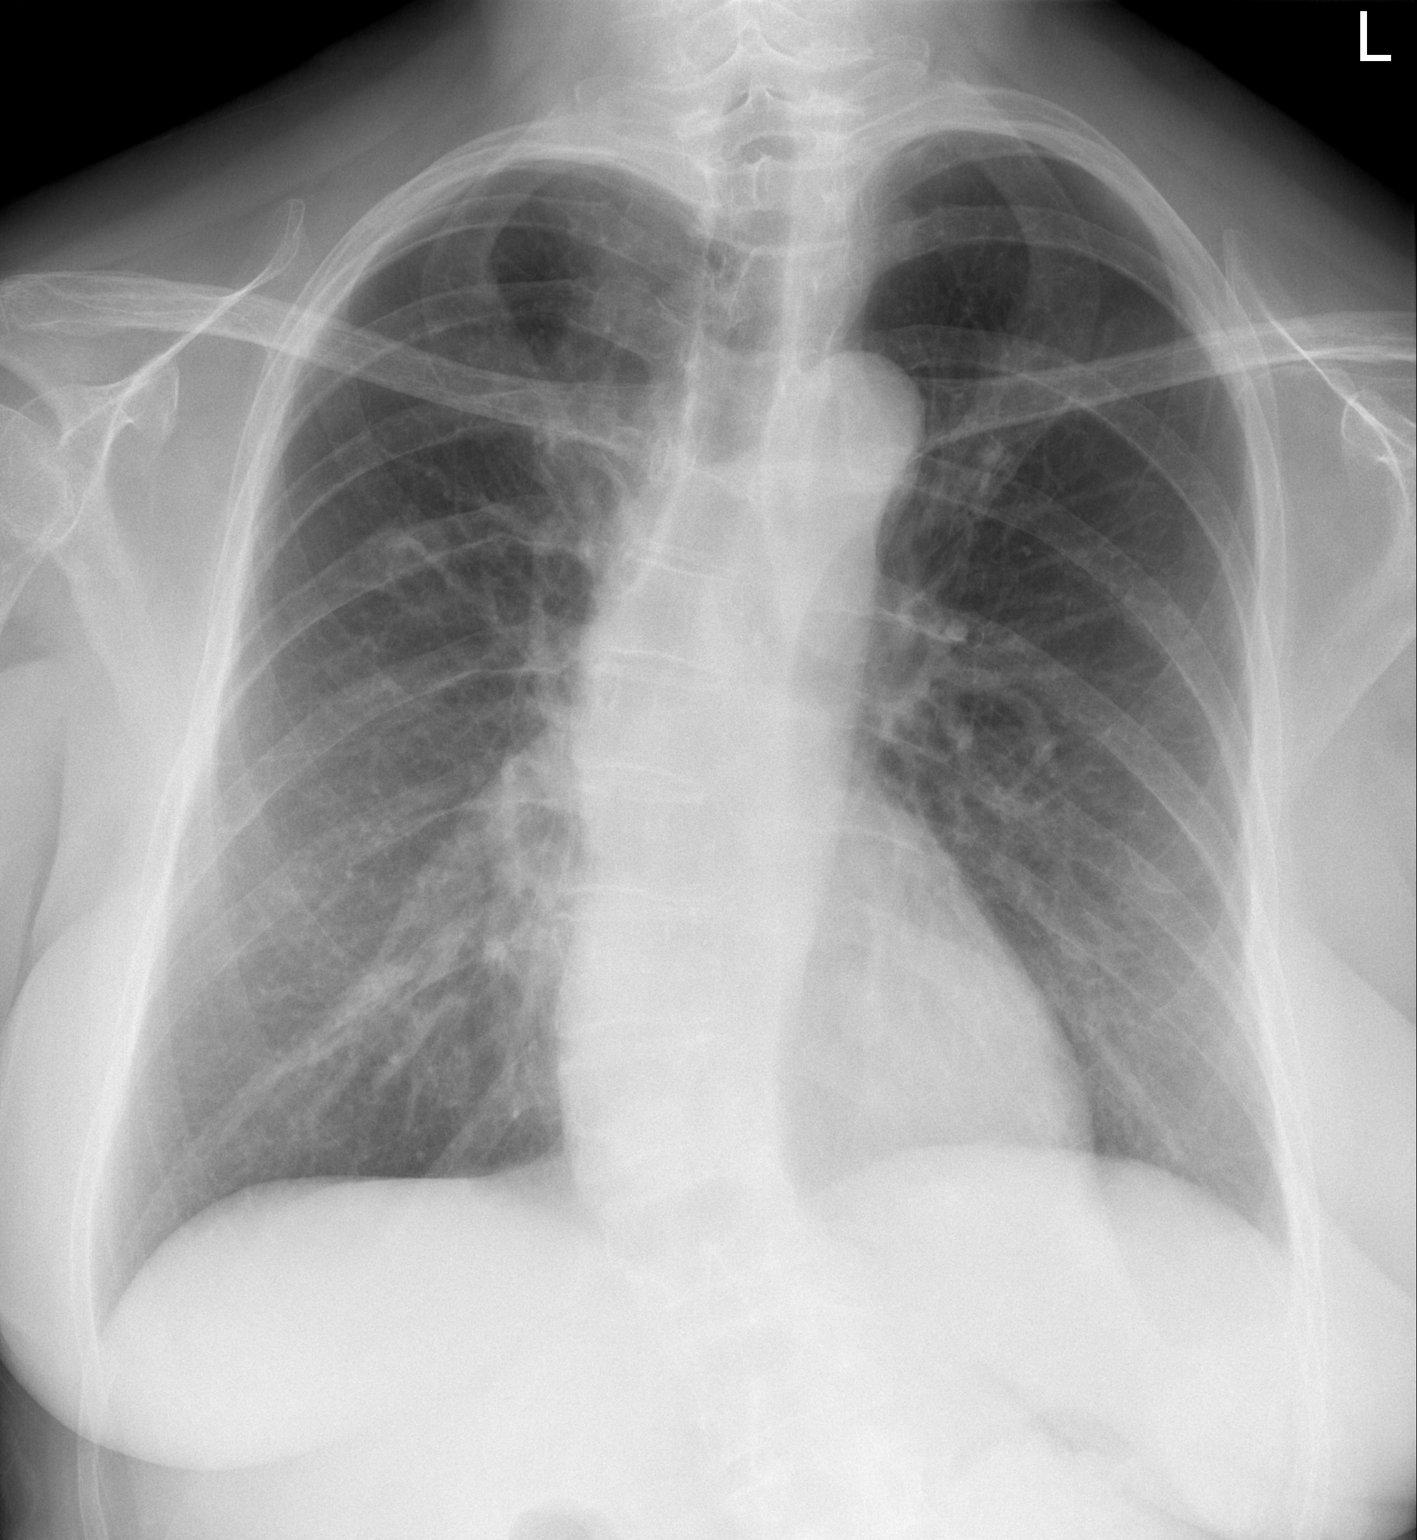

[w chest lat]
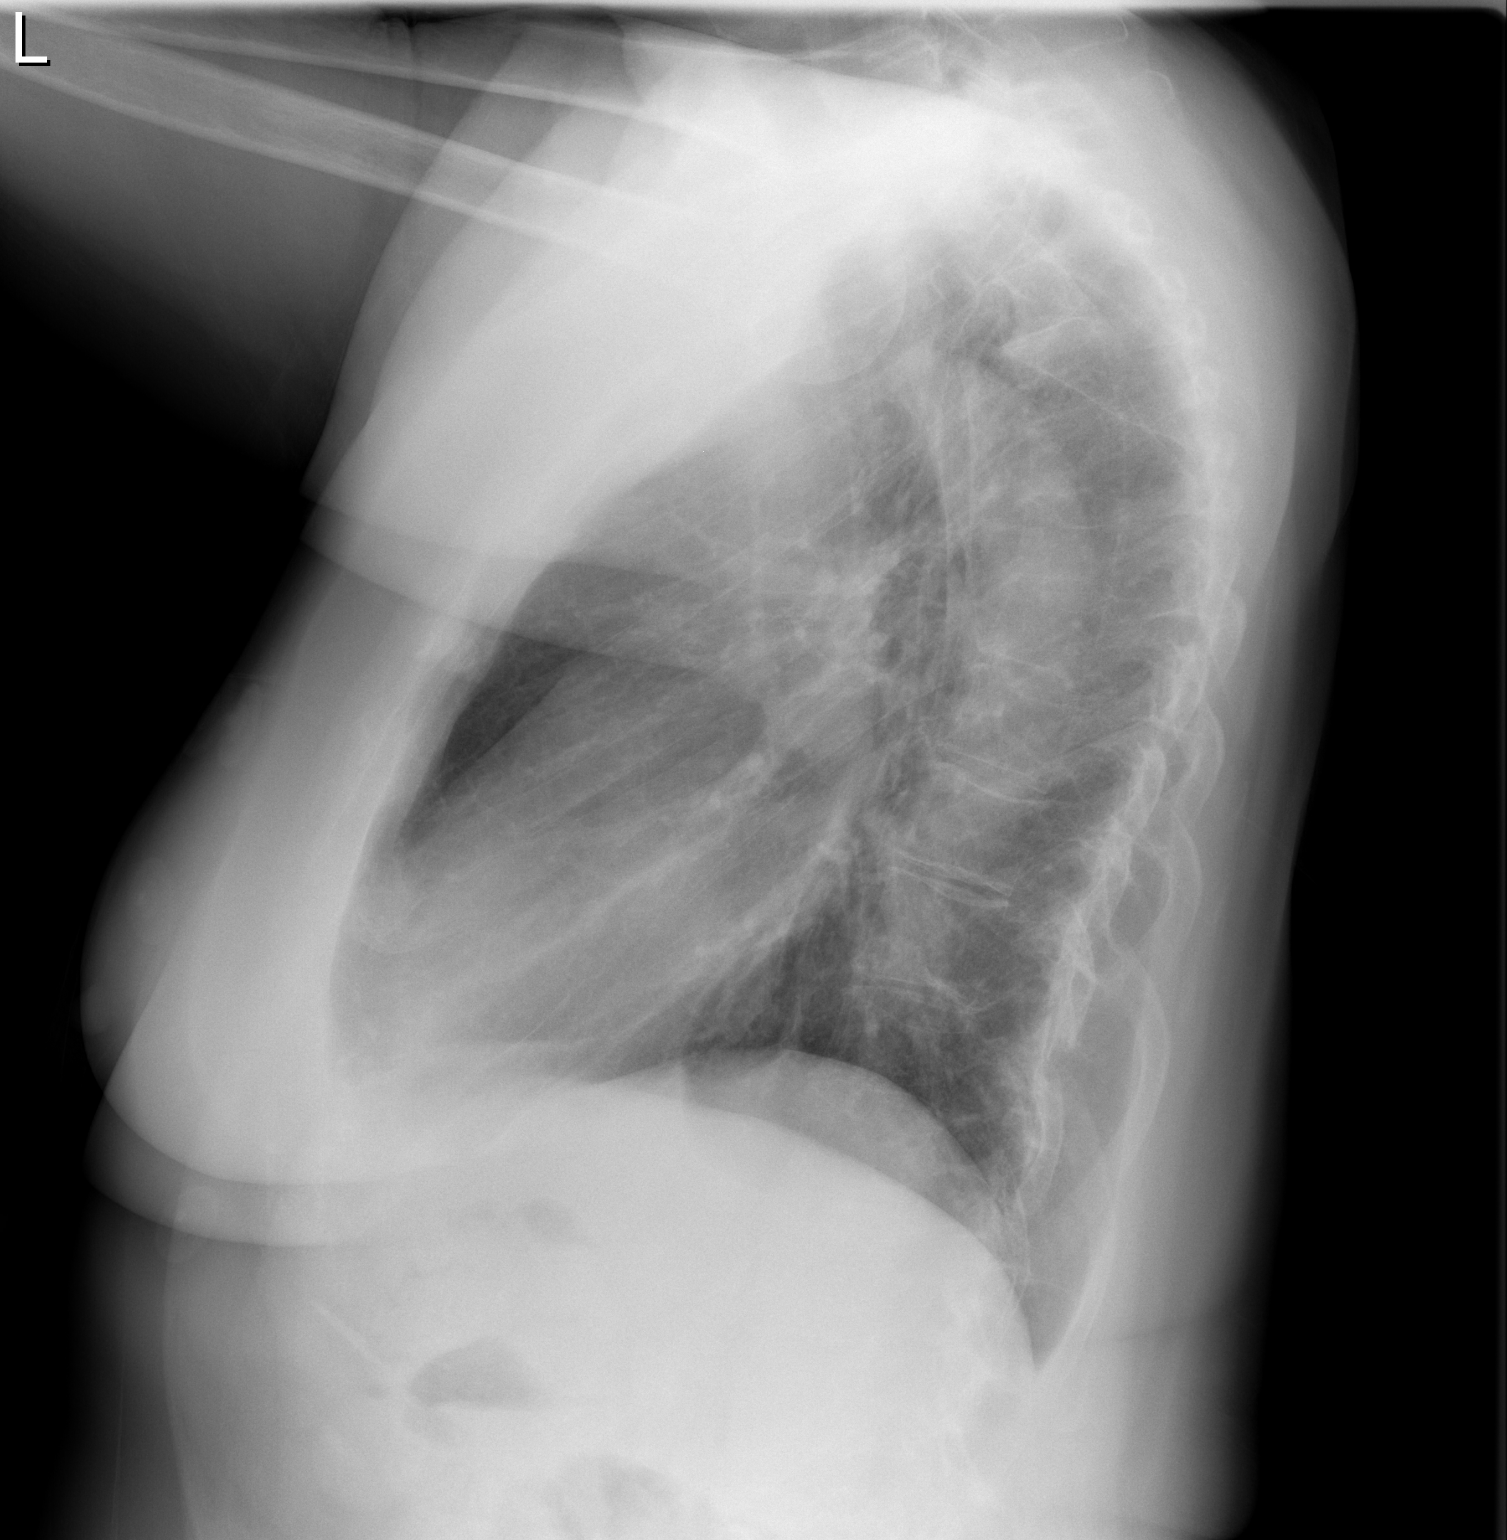

[2 of 2 positions shown; findings below may reference images not displayed]

FINDINGS: Frontal and lateral views of the chest demonstrate an unremarkable
cardiac silhouette. No airspace disease, effusion, or pneumothorax.
There are no acute bony abnormalities. Right convex thoracolumbar
scoliosis unchanged.
IMPRESSION: 1. No acute intrathoracic process.

## 2021-09-05 ENCOUNTER — Other Ambulatory Visit: Payer: Self-pay | Admitting: Family Medicine

## 2021-09-08 ENCOUNTER — Other Ambulatory Visit: Payer: Self-pay

## 2021-09-08 ENCOUNTER — Ambulatory Visit: Payer: Medicare HMO | Admitting: Family Medicine

## 2021-09-08 VITALS — BP 108/72 | HR 89 | Ht 64.0 in | Wt 143.8 lb

## 2021-09-08 DIAGNOSIS — M4126 Other idiopathic scoliosis, lumbar region: Secondary | ICD-10-CM

## 2021-09-08 DIAGNOSIS — G8929 Other chronic pain: Secondary | ICD-10-CM

## 2021-09-08 DIAGNOSIS — M545 Low back pain, unspecified: Secondary | ICD-10-CM | POA: Diagnosis not present

## 2021-09-08 NOTE — Patient Instructions (Signed)
Thank you for coming in today.   You should hear from MRI scheduling within 1 week. If you do not hear please let me know.    Recheck following the MRI.  Next step is likely facet joint injections or facet joint blocks.   Facet Joint Block The facet joints connect the bones of the spine (vertebrae). They make it possible for you to bend, twist, and make other movements with your spine. They also keep you from bending too far, twisting too far, and making other extreme movements. A facet joint block is a procedure in which a numbing medicine (anesthetic) is injected into a facet joint. In many cases, an anti-inflammatory medicine (steroid) is also injected. A facet joint block may be done: To diagnose neck or back pain. If the pain gets better after a facet joint block, it means the pain is probably coming from the facet joint. If the pain does not get better, it means the pain is probably not coming from the facet joint. To relieve neck or back pain that is caused by an inflamed facet joint. A facet joint block is only done to relieve pain if the pain does not improve with other methods, such as medicine, exercise programs, and physical therapy. Tell a health care provider about: Any allergies you have. All medicines you are taking, including vitamins, herbs, eye drops, creams, and over-the-counter medicines. Any problems you or family members have had with anesthetic medicines. Any blood disorders you have. Any surgeries you have had. Any medical conditions you have or have had. Whether you are pregnant or may be pregnant. What are the risks? Generally, this is a safe procedure. However, problems may occur, including: Bleeding. Injury to a nerve near the injection site. Pain at the injection site. Weakness or numbness in areas controlled by nerves near the injection site. Infection. Temporary fluid retention. Allergic reactions to medicines or dyes. Injury to other structures or organs  near the injection site. What happens before the procedure? Medicines Ask your health care provider about: Changing or stopping your regular medicines. This is especially important if you are taking diabetes medicines or blood thinners. Taking medicines such as aspirin and ibuprofen. These medicines can thin your blood. Do not take these medicines unless your health care provider tells you to take them. Taking over-the-counter medicines, vitamins, herbs, and supplements. Eating and drinking Follow instructions from your health care provider about eating and drinking, which may include: 8 hours before the procedure - stop eating heavy meals or foods, such as meat, fried foods, or fatty foods. 6 hours before the procedure - stop eating light meals or foods, such as toast or cereal. 6 hours before the procedure - stop drinking milk or drinks that contain milk. 2 hours before the procedure - stop drinking clear liquids. Staying hydrated Follow instructions from your health care provider about hydration, which may include: Up to 2 hours before the procedure - you may continue to drink clear liquids, such as water, clear fruit juice, black coffee, and plain tea. General instructions Do not use any products that contain nicotine or tobacco for at least 4-6 weeks before the procedure. These products include cigarettes, e-cigarettes, and chewing tobacco. If you need help quitting, ask your health care provider. Plan to have someone take you home from the hospital or clinic. Ask your health care provider: How your surgery site will be marked. What steps will be taken to help prevent infection. These may include: Removing hair at the  surgery site. Washing skin with a germ-killing soap. Receiving antibiotic medicine. What happens during the procedure?  You will put on a hospital gown. You will lie on your stomach on an X-ray table. You may be asked to lie in a different position if an injection will  be made in your neck. Machines will be used to monitor your oxygen levels, heart rate, and blood pressure. Your skin will be cleaned. If an injection will be made in your neck, an IV will be inserted into one of your veins. Fluids and medicine will flow directly into your body through the IV. A numbing medicine (local anesthetic) will be applied to your skin. Your skin may sting or burn for a moment. A video X-ray machine (fluoroscopy) will be used to find the joint. In some cases, a CT scan may be used. A contrast dye may be injected into the facet joint area to help find the joint. When the joint is located, an anesthetic will be injected into the joint through the needle. Your health care provider will ask you whether you feel pain relief. If you feel relief, a steroid may be injected to provide pain relief for a longer period of time. If you do not feel relief or feel only partial relief, additional injections of an anesthetic may be made in other facet joints. The needle will be removed. Your skin will be cleaned. A bandage (dressing) will be applied over each injection site. The procedure may vary among health care providers and hospitals. What happens after the procedure? Your blood pressure, heart rate, breathing rate, and blood oxygen level will be monitored until you leave the hospital or clinic. You will lie down and rest for a period of time. Summary A facet joint block is a procedure in which a numbing medicine (anesthetic) is injected into a facet joint. An anti-inflammatory medicine (stereoid) may also be injected. Follow instructions from your health care provider about medicines and eating and drinking before the procedure. Do not use any products that contain nicotine or tobacco for at least 4-6 weeks before the procedure. You will lie on your stomach for the procedure, but you may be asked to lie in a different position if an injection will be made in your neck. When the  joint is located, an anesthetic will be injected into the joint through the needle. This information is not intended to replace advice given to you by your health care provider. Make sure you discuss any questions you have with your health care provider. Document Revised: 03/16/2019 Document Reviewed: 10/28/2018 Elsevier Patient Education  2022 [employer].

## 2021-09-08 NOTE — Progress Notes (Signed)
   I, [name], LAT, ATC acting as a scribe for Lindsay Leader, MD.  Lindsay Reyes is a 70 y.o. female who presents to [place] at [facility] today for back pain. Pt was last seen by Dr. Georgina Snell on 04/10/21 for acute-on-chronic R-sided LBP and completed 17 PT visits. Today, pt locates pain to L-side of her low back.  Pain does not radiate.  Pt is wondering what the results from her bone density.  She has tried physical therapy for the bilateral left worse than right low back pain which is helped a little.  Dx imaging: 04/10/21 L-spine & R hip XR  Pertinent review of systems: No fevers or chills  Relevant historical information: Osteopenia   Exam:  BP 108/72   Pulse 89   Ht 5\' 4"  (1.626 m)   Wt 143 lb 12.8 oz (65.2 kg)   SpO2 97%   BMI 24.68 kg/m  General: Well Developed, well nourished, and in no acute distress.   MSK: Lumbar spine: Normal.  Nontender midline.  Tender palpation left lumbar paraspinal musculature Decreased lumbar motion. Lower extremity strength is intact.   Lab and Radiology Results   EXAM: LUMBAR SPINE - 2-3 VIEW   COMPARISON:  03/24/2011   FINDINGS: Five non-rib-bearing lumbar vertebrae. No significant change in mild to moderate levoconvex lumbar rotary scoliosis. No significant change in mild to moderate lateral spur formation at multiple levels on the right. No significant change in mild lower lumbar spine facet degenerative changes. No fractures, pars defects or subluxations.   IMPRESSION: Stable scoliosis and degenerative changes.     Electronically Signed   By: Claudie Revering M.D.   On: 04/13/2021 18:29 I, Lindsay Reyes, personally (independently) visualized and performed the interpretation of the images attached in this note.     Assessment and Plan: 70 y.o. female with chronic low back pain radiation.  Pain is bilaterally at times.  Today the left is worse than the right.  Pain seems to be focused more in the base of her  lumbar spine thought to be due to facet degenerative changes as well as the degenerative scoliosis changes seen on her lumbar x-ray from May of this year.  She has had a great trial of physical therapy completing 17 sessions of physical therapy ending on July 19.  At this point she is exhausted conservative management strategies.  Plan to proceed to MRI to evaluate cause of low back pain and for facet injection planning or facet medial branch block and ablation planning.  Recheck after MRI.  Patient agrees with plan after discussion.   PDMP not reviewed this encounter. Orders Placed This Encounter  Procedures   MR Lumbar Spine Wo Contrast    Standing Status:   Future    Standing Expiration Date:   09/08/2022    Order Specific Question:   What is the patient's sedation requirement?    Answer:   No Sedation    Order Specific Question:   Does the patient have a pacemaker or implanted devices?    Answer:   No    Order Specific Question:   Preferred imaging location?    Answer:   [facility] (table limit-550lbs)   No orders of the defined types were placed in this encounter.    Discussed warning signs or symptoms. Please see discharge instructions. Patient expresses understanding.   The above documentation has been reviewed and is accurate and complete Lindsay Reyes, M.D.

## 2021-09-24 ENCOUNTER — Ambulatory Visit
Admission: RE | Admit: 2021-09-24 | Discharge: 2021-09-24 | Disposition: A | Discharge status: Home / Self Care | Payer: Medicare HMO | Source: Ambulatory Visit | Attending: Family Medicine | Admitting: Family Medicine

## 2021-09-24 ENCOUNTER — Other Ambulatory Visit: Payer: Self-pay

## 2021-09-24 DIAGNOSIS — M48061 Spinal stenosis, lumbar region without neurogenic claudication: Secondary | ICD-10-CM | POA: Diagnosis not present

## 2021-09-24 DIAGNOSIS — G8929 Other chronic pain: Secondary | ICD-10-CM

## 2021-09-24 DIAGNOSIS — M4126 Other idiopathic scoliosis, lumbar region: Secondary | ICD-10-CM

## 2021-09-24 DIAGNOSIS — M545 Low back pain, unspecified: Secondary | ICD-10-CM

## 2021-09-26 NOTE — Progress Notes (Signed)
I, Lindsay Reyes, LAT, ATC acting as a scribe for Lindsay Leader, MD.  Lindsay Reyes is a 70 y.o. female who presents to [place] at [facility] today for f/u chronic LBP and L-spine MRI review. Pt was last seen by Dr. Georgina Snell on 09/08/21 and was advised to proceed to MRI. Today, pt reports LBP is the same since her last visit. No numbness/tingling noted. Pt notes no relief from flexeril.  Pain is located primarily in the left low back.  Dx imaging: 09/24/21 L-spine MRI 5/22 L-spine & R hip XR  Pertinent review of systems: No fevers or chills  Relevant historical information: Scoliosis.   Exam:  BP 112/78   Pulse 86   Ht 5\' 4"  (1.626 m)   Wt 142 lb 9.6 oz (64.7 kg)   SpO2 97%   BMI 24.48 kg/m  General: Well Developed, well nourished, and in no acute distress.   MSK: L-spine: Nontender midline.  Tender palpation left lumbar paraspinal musculature and SI joint region.  Decreased lumbar motion. Lower extremity strength is intact.    Lab and Radiology Results EXAM: MRI LUMBAR SPINE WITHOUT CONTRAST   TECHNIQUE: Multiplanar, multisequence MR imaging of the lumbar spine was performed. No intravenous contrast was administered.   COMPARISON:  Lumbar radiographs Apr 10, 2021.   FINDINGS: Segmentation: 5 non rib-bearing lumbar vertebral bodies on prior radiographs.   Alignment:  Moderate Levocurvature centered at L2-L3.   Vertebrae: Scattered T1 hyperintense lesions throughout the thoracolumbar spine and sacrum, compatible with benign vertebral venous malformations. Schmorl's node involving the inferior T12 endplate. Degenerative/discogenic endplate signal changes about the L2-L3 and L5-S1 discs. No specific evidence of acute fracture or discitis/osteomyelitis.   Conus medullaris and cauda equina: Conus extends to the L1 level. Conus appears normal.   Paraspinal and other soft tissues: Bilateral renal cysts. Paraspinal muscular atrophy. Mild right L4-L5  inflammatory perifacet edema.   Disc levels:   T12-L1: No significant disc protrusion, foraminal stenosis, or canal stenosis.   L1-L2: Slight disc bulging without significant canal or foraminal stenosis.   L2-L3: Mild right a centric disc bulge and mild right facet arthropathy without significant canal or foraminal stenosis.   L3-L4: Disc height loss, eccentric to the right. Small left foraminal disc protrusion. Severe right and mild left facet arthropathy without significant canal or foraminal stenosis.   L4-L5: Moderate bilateral facet arthropathy. Mild disc bulging. Resulting mild left foraminal stenosis without significant canal or right foraminal stenosis.   L5-S1: Disc height loss. Mild bilateral facet arthropathy without significant canal or foraminal stenosis.   IMPRESSION: 1. Mild left foraminal stenosis at L4-L5. No significant canal stenosis. 2. Multilevel facet arthropathy, severe on the right at L4-L5 with mild perifacet edema. 3. Moderate Levocurvature centered at L2-L3 with multilevel degenerative disease.     Electronically Signed   By: Margaretha Sheffield M.D.   On: 09/25/2021 16:20   I, Lindsay Reyes, personally (independently) visualized and performed the interpretation of the images attached in this note.     Assessment and Plan: 70 y.o. female with chronic axial back pain.  [name] has already had a significant trial of conservative management with 17 physical therapy sessions.  At this point I think it is time to proceed with interventional procedures.  She is a good candidate for facet injections or medial branch block and ablations.  I anticipate that she will probably need more than a few medial branch block and ablation or procedures and will refer to physical medicine  and rehab for this.  Refer to Dr. Ernestina Patches at [place] [facility].   PDMP not reviewed this encounter. Orders Placed This Encounter  Procedures   Ambulatory referral to Physical Medicine  Rehab    Referral Priority:   Routine    Referral Type:   Rehabilitation    Referral Reason:   Specialty Services Required    Requested Specialty:   Physical Medicine and Rehabilitation    Number of Visits Requested:   1   No orders of the defined types were placed in this encounter.    Discussed warning signs or symptoms. Please see discharge instructions. Patient expresses understanding.   The above documentation has been reviewed and is accurate and complete Lindsay Reyes, M.D.  Total encounter time 20 minutes including face-to-face time with the patient and, reviewing past medical record, and charting on the date of service.   Reviewed MRI discussed treatment plan and options.

## 2021-09-26 NOTE — Progress Notes (Signed)
MRI shows multilevel facet arthritis severe on the right at L4-L5.  This is a good location for injection potentially.  Recommend return to clinic to go over the MRI results and plan for neck steps including potential injections.

## 2021-09-29 ENCOUNTER — Ambulatory Visit: Payer: Medicare HMO | Admitting: Family Medicine

## 2021-09-29 ENCOUNTER — Other Ambulatory Visit: Payer: Self-pay

## 2021-09-29 VITALS — BP 112/78 | HR 86 | Ht 64.0 in | Wt 142.6 lb

## 2021-09-29 DIAGNOSIS — M545 Low back pain, unspecified: Secondary | ICD-10-CM | POA: Diagnosis not present

## 2021-09-29 DIAGNOSIS — G8929 Other chronic pain: Secondary | ICD-10-CM

## 2021-09-29 DIAGNOSIS — M4126 Other idiopathic scoliosis, lumbar region: Secondary | ICD-10-CM | POA: Diagnosis not present

## 2021-09-29 NOTE — Patient Instructions (Addendum)
Thank you for coming in today.   You should hear from Dr Ernestina Patches.    Let me know if you have a problem.

## 2021-09-30 ENCOUNTER — Telehealth: Payer: Self-pay | Admitting: Physical Medicine and Rehabilitation

## 2021-09-30 NOTE — Telephone Encounter (Signed)
Patient called needing to schedule an appointment with Dr Ernestina Patches. Patient said she is being referred to Dr. Ernestina Patches      The number to contact patient is [phone]

## 2021-10-06 ENCOUNTER — Other Ambulatory Visit: Payer: Self-pay

## 2021-10-06 ENCOUNTER — Encounter: Payer: Self-pay | Admitting: Physical Medicine and Rehabilitation

## 2021-10-06 ENCOUNTER — Ambulatory Visit: Payer: Medicare HMO | Admitting: Physical Medicine and Rehabilitation

## 2021-10-06 VITALS — BP 129/90 | HR 114

## 2021-10-06 DIAGNOSIS — M545 Low back pain, unspecified: Secondary | ICD-10-CM

## 2021-10-06 DIAGNOSIS — M47816 Spondylosis without myelopathy or radiculopathy, lumbar region: Secondary | ICD-10-CM

## 2021-10-06 DIAGNOSIS — G8929 Other chronic pain: Secondary | ICD-10-CM | POA: Diagnosis not present

## 2021-10-06 DIAGNOSIS — M4126 Other idiopathic scoliosis, lumbar region: Secondary | ICD-10-CM

## 2021-10-06 NOTE — Progress Notes (Signed)
Pt state lower back pain that travels to her left side of her stomach. Pt state walking, standing and sitting makes the pain worse. Pt state she uses heating and laying down to help ease her pain.   Numeric Pain Rating Scale and Functional Assessment Average Pain 4 Pain Right Now 10 My pain is constant, sharp, stabbing, and aching Pain is worse with: walking, sitting, standing, and some activites Pain improves with: heat/ice   In the last MONTH (on 0-10 scale) has pain interfered with the following?  1. General activity like being  able to carry out your everyday physical activities such as walking, climbing stairs, carrying groceries, or moving a chair?  Rating(7)  2. Relation with others like being able to carry out your usual social activities and roles such as  activities at home, at work and in your community. Rating(8)  3. Enjoyment of life such that you have  been bothered by emotional problems such as feeling anxious, depressed or irritable?  Rating(9)

## 2021-10-06 NOTE — Progress Notes (Signed)
Lindsay Reyes - 70 y.o. female MRN [id number]  Date of birth: Oct 06, 1951  Office Visit Note: Visit Date: 10/06/2021 PCP: [name], MD Referred by: [name], MD  Subjective: Chief Complaint  Patient presents with   Lower Back - Pain   HPI: Lindsay Reyes is a 70 y.o. female who comes in today per the request of Dr. [name] for evaluation of bilateral lower back pain. Patient also reports pain does radiate to left lateral side. Patient reports pain ongoing for several years. Patient reports pain is exacerbated by activity, movement and prolonged standing. Patient describes pain as soreness and aching sensation. Patient reports some pain relief with home exercise program, laying down, rest and use of Flexeril as needed.  Patient's recent lumbar MRI exhibits moderate levocurvature centered at L2-L3, multilevel facet hypertrophy, most severe on the right at L3-L4. No high grade spinal canal stenosis. Patient has attended multiple sessions of physical therapy at [place] and reports these treatments do not help to alleviate her pain. Patient states she is having difficulty performing daily tasks such as cleaning and grocery shopping due to severe pain. Patient denies focal weakness, numbness and tingling. Patient denies recent trauma or falls.   Review of Systems  Musculoskeletal:  Positive for back pain and myalgias.  Neurological:  Negative for tingling, sensory change, focal weakness and weakness.  All other systems reviewed and are negative. Otherwise per HPI.  Assessment & Plan: Visit Diagnoses:    ICD-10-CM   1. Chronic bilateral low back pain without sciatica  M54.50 Ambulatory referral to Physical Medicine Rehab   G89.29     2. Spondylosis without myelopathy or radiculopathy, lumbar region  M47.816     3. Facet hypertrophy of lumbar region  M47.816     4. Other idiopathic scoliosis, lumbar region  M41.26        Plan: Findings:   Chronic, worsening and severe bilateral axial back pain. Patient continues to have excruciating pain despite good conservative therapies such as formal physical therapy, home exercises, rest and use of medications as needed.  Patient's clinical presentation and exam are consistent with facet mediated pain.  We are not confident that the pain radiating to her left lateral side is related to her back, this pain could be myofascial in nature. We feel the next step would be to perform diagnostic and hopefully therapeutic bilateral L3-L4 and L4-L5 facet joint/medial branch blocks under fluoroscopic guidance. We did discuss the possibility of longer lasting pain relief with radiofrequency ablation. We did provide patient with educational literature regarding radiofrequency ablation today. Patient encouraged to continue home exercises as tolerated. No red flag symptoms noted.    Meds & Orders: No orders of the defined types were placed in this encounter.   Orders Placed This Encounter  Procedures   Ambulatory referral to Physical Medicine Rehab     Follow-up: Return in about 1 week (around 10/13/2021) for BIlateral L3-L4 and L4-L5 facet joint/medial branch blocks.   Procedures: No procedures performed      Clinical History: MRI LUMBAR SPINE WITHOUT CONTRAST   TECHNIQUE: Multiplanar, multisequence MR imaging of the lumbar spine was performed. No intravenous contrast was administered.   COMPARISON:  Lumbar radiographs Apr 10, 2021.   FINDINGS: Segmentation: 5 non rib-bearing lumbar vertebral bodies on prior radiographs.   Alignment:  Moderate Levocurvature centered at L2-L3.   Vertebrae: Scattered T1 hyperintense lesions throughout the thoracolumbar spine and sacrum, compatible with benign vertebral venous  malformations. Schmorl's node involving the inferior T12 endplate. Degenerative/discogenic endplate signal changes about the L2-L3 and L5-S1 discs. No specific evidence of acute fracture  or discitis/osteomyelitis.   Conus medullaris and cauda equina: Conus extends to the L1 level. Conus appears normal.   Paraspinal and other soft tissues: Bilateral renal cysts. Paraspinal muscular atrophy. Mild right L4-L5 inflammatory perifacet edema.   Disc levels:   T12-L1: No significant disc protrusion, foraminal stenosis, or canal stenosis.   L1-L2: Slight disc bulging without significant canal or foraminal stenosis.   L2-L3: Mild right a centric disc bulge and mild right facet arthropathy without significant canal or foraminal stenosis.   L3-L4: Disc height loss, eccentric to the right. Small left foraminal disc protrusion. Severe right and mild left facet arthropathy without significant canal or foraminal stenosis.   L4-L5: Moderate bilateral facet arthropathy. Mild disc bulging. Resulting mild left foraminal stenosis without significant canal or right foraminal stenosis.   L5-S1: Disc height loss. Mild bilateral facet arthropathy without significant canal or foraminal stenosis.   IMPRESSION: 1. Mild left foraminal stenosis at L4-L5. No significant canal stenosis. 2. Multilevel facet arthropathy, severe on the right at L4-L5 with mild perifacet edema. 3. Moderate Levocurvature centered at L2-L3 with multilevel degenerative disease.     Electronically Signed   By: Margaretha Sheffield M.D.   On: 09/25/2021 16:20   She reports that she has never smoked. She has never used smokeless tobacco. No results for input(s): HGBA1C, LABURIC in the last 8760 hours.  Objective:  VS:  HT:    WT:   BMI:     BP:129/90  HR:(!) 114bpm  TEMP: ( )  RESP:  Physical Exam Vitals and nursing note reviewed.  HENT:     Head: Normocephalic and atraumatic.     Right Ear: External ear normal.     Left Ear: External ear normal.     Nose: Nose normal.     Mouth/Throat:     Mouth: Mucous membranes are moist.  Eyes:     Extraocular Movements: Extraocular movements intact.   Cardiovascular:     Rate and Rhythm: Normal rate.     Pulses: Normal pulses.  Pulmonary:     Effort: Pulmonary effort is normal.  Abdominal:     General: Abdomen is flat. There is no distension.  Musculoskeletal:        General: Tenderness present.     Cervical back: Normal range of motion.     Comments: Pt is slow to rise from seated position to standing. Pain noted upon facet loading. Strong distal strength without clonus, no pain upon palpation of greater trochanters. Sensation intact bilaterally. Walks independently, gait steady.     Skin:    General: Skin is warm and dry.     Capillary Refill: Capillary refill takes less than 2 seconds.  Neurological:     General: No focal deficit present.     Mental Status: She is alert.  Psychiatric:        Mood and Affect: Mood normal.    Ortho Exam  Imaging: No results found.  Past Medical/Family/Surgical/Social History: Medications & Allergies reviewed per EMR, new medications updated. 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   Urinary incontinence 01/18/2018   LOW BACK PAIN, CHRONIC 12/12/2007   DIVERTICULOSIS, COLON W/O HEM 08/25/2007   GERD 04/15/2007   Past Medical History:  Diagnosis Date   Arthritis    Colon polyp    Depression    Diverticulosis    GERD (gastroesophageal reflux disease)    Hyperlipidemia    Insomnia    Retinal micro-aneurysm of right eye    Scoliosis    Family History  Problem Relation Age of Onset   Hypertension Mother     Diabetes Mother    Osteoporosis Mother    Heart Problems Mother    Thyroid cancer Sister    Colon cancer Neg Hx    Past Surgical History:  Procedure Laterality Date   ABDOMINAL HYSTERECTOMY  2002   COLONOSCOPY     Social History   Occupational History   Not on file  Tobacco Use   Smoking status: Never   Smokeless tobacco: Never  Vaping Use   Vaping Use: Never used  Substance and Sexual Activity   Alcohol use: Not Currently    Comment: socially   Drug use: No   Sexual activity: Not on file

## 2021-10-21 ENCOUNTER — Encounter: Payer: Self-pay | Admitting: Physical Medicine and Rehabilitation

## 2021-10-21 ENCOUNTER — Ambulatory Visit (INDEPENDENT_AMBULATORY_CARE_PROVIDER_SITE_OTHER): Payer: Medicare HMO | Admitting: Physical Medicine and Rehabilitation

## 2021-10-21 ENCOUNTER — Other Ambulatory Visit: Payer: Self-pay

## 2021-10-21 ENCOUNTER — Ambulatory Visit: Payer: Self-pay

## 2021-10-21 VITALS — BP 138/81 | HR 102

## 2021-10-21 DIAGNOSIS — M47816 Spondylosis without myelopathy or radiculopathy, lumbar region: Secondary | ICD-10-CM

## 2021-10-21 MED ORDER — BUPIVACAINE HCL 0.25 % IJ SOLN
2.0000 mL | Freq: Once | INTRAMUSCULAR | Status: AC
  Administered 2021-10-21: 14:00:00 2 mL

## 2021-10-21 MED ORDER — METHYLPREDNISOLONE ACETATE 80 MG/ML IJ SUSP: 80.0000 mg | Freq: Once | INTRAMUSCULAR | Status: DC

## 2021-10-21 NOTE — Patient Instructions (Signed)

## 2021-10-21 NOTE — Progress Notes (Signed)
Pt state lower back pain that travels hip and buttock. Pt state walking, standing and sitting makes the pain worse. Pt state she takes pain meds and uses haet tto help ease her pain.  Numeric Pain Rating Scale and Functional Assessment Average Pain 5   In the last MONTH (on 0-10 scale) has pain interfered with the following?  1. General activity like being  able to carry out your everyday physical activities such as walking, climbing stairs, carrying groceries, or moving a chair?  Rating(10)   +Driver, -BT, -Dye Allergies.

## 2021-11-03 NOTE — Progress Notes (Signed)
Lindsay Reyes - 70 y.o. female MRN [id number]  Date of birth: [birth year]  Office Visit Note: Visit Date: 10/21/2021 PCP: [name], MD Referred by: [name], MD  Subjective: Chief Complaint  Patient presents with   Lower Back - Pain   Left Hip - Pain   HPI:  Lindsay Reyes is a 70 y.o. female who comes in today at the request of Barnet Pall, FNP for planned Bilateral  L3-4 and L4-5 Lumbar facet/medial branch block with fluoroscopic guidance.  The patient has failed conservative care including home exercise, medications, time and activity modification.  This injection will be diagnostic and hopefully therapeutic.  Please see requesting physician notes for further details and justification.  Exam has shown concordant pain with facet joint loading.  ROS Otherwise per HPI.  Assessment & Plan: Visit Diagnoses:    ICD-10-CM   1. Spondylosis without myelopathy or radiculopathy, lumbar region  M47.816 XR C-ARM NO REPORT    Facet Injection    bupivacaine (MARCAINE) 0.25 % (with pres) injection 2 mL    DISCONTINUED: methylPREDNISolone acetate (DEPO-MEDROL) injection 80 mg      Plan: No additional findings.   Meds & Orders:  Meds ordered this encounter  Medications   DISCONTD: methylPREDNISolone acetate (DEPO-MEDROL) injection 80 mg   bupivacaine (MARCAINE) 0.25 % (with pres) injection 2 mL    Orders Placed This Encounter  Procedures   Facet Injection   XR C-ARM NO REPORT    Follow-up: Return for Review Pain Diary.   Procedures: No procedures performed  Lumbar Diagnostic Facet Joint Nerve Block with Fluoroscopic Guidance   Patient: Lindsay Reyes      Date of Birth: Sep 16, 1951 MRN: [id number] PCP: [name], MD      Visit Date: 10/21/2021   Universal Protocol:    Date/Time: 11/28/228:25 PM  Consent Given By: the patient  Position: PRONE  Additional Comments: Vital signs were monitored before and after the procedure. Patient was prepped and draped  in the usual sterile fashion. The correct patient, procedure, and site was verified.   Injection Procedure Details:   Procedure diagnoses:  1. Spondylosis without myelopathy or radiculopathy, lumbar region      Meds Administered:  Meds ordered this encounter  Medications   methylPREDNISolone acetate (DEPO-MEDROL) injection 80 mg   bupivacaine (MARCAINE) 0.25 % (with pres) injection 2 mL     Laterality: Bilateral  Location/Site: L3-L4, L2 and L3 medial branches and L4-L5, L3 and L4 medial branches  Needle: 5.0 in., 25 ga.  Short bevel or Quincke spinal needle  Needle Placement: Oblique pedical  Findings:   -Comments: There was excellent flow of contrast along the articular pillars without intravascular flow.  Procedure Details: The fluoroscope beam is vertically oriented in AP and then obliqued 15 to 20 degrees to the ipsilateral side of the desired nerve to achieve the "Scotty dog" appearance.  The skin over the target area of the junction of the superior articulating process and the transverse process (sacral ala if blocking the L5 dorsal rami) was locally anesthetized with a 1 ml volume of 1% Lidocaine without Epinephrine.  The spinal needle was inserted and advanced in a trajectory view down to the target.   After contact with periosteum and negative aspirate for blood and CSF, correct placement without intravascular or epidural spread was confirmed by injecting 0.5 ml. of Isovue-250.  A spot radiograph was obtained of this image.    Next, a 0.5 ml. volume of the injectate described  above was injected. The needle was then redirected to the other facet joint nerves mentioned above if needed.  Prior to the procedure, the patient was given a Pain Diary which was completed for baseline measurements.  After the procedure, the patient rated their pain every 30 minutes and will continue rating at this frequency for a total of 5 hours.  The patient has been asked to complete the Diary  and return to [place] by mail, fax or hand delivered as soon as possible.   Additional Comments:  No complications occurred Dressing: 2 x 2 sterile gauze and Band-Aid    Post-procedure details: Patient was observed during the procedure. Post-procedure instructions were reviewed.  Patient left the clinic in stable condition.    Clinical History: MRI LUMBAR SPINE WITHOUT CONTRAST   TECHNIQUE: Multiplanar, multisequence MR imaging of the lumbar spine was performed. No intravenous contrast was administered.   COMPARISON:  Lumbar radiographs Apr 10, 2021.   FINDINGS: Segmentation: 5 non rib-bearing lumbar vertebral bodies on prior radiographs.   Alignment:  Moderate Levocurvature centered at L2-L3.   Vertebrae: Scattered T1 hyperintense lesions throughout the thoracolumbar spine and sacrum, compatible with benign vertebral venous malformations. Schmorl's node involving the inferior T12 endplate. Degenerative/discogenic endplate signal changes about the L2-L3 and L5-S1 discs. No specific evidence of acute fracture or discitis/osteomyelitis.   Conus medullaris and cauda equina: Conus extends to the L1 level. Conus appears normal.   Paraspinal and other soft tissues: Bilateral renal cysts. Paraspinal muscular atrophy. Mild right L4-L5 inflammatory perifacet edema.   Disc levels:   T12-L1: No significant disc protrusion, foraminal stenosis, or canal stenosis.   L1-L2: Slight disc bulging without significant canal or foraminal stenosis.   L2-L3: Mild right a centric disc bulge and mild right facet arthropathy without significant canal or foraminal stenosis.   L3-L4: Disc height loss, eccentric to the right. Small left foraminal disc protrusion. Severe right and mild left facet arthropathy without significant canal or foraminal stenosis.   L4-L5: Moderate bilateral facet arthropathy. Mild disc bulging. Resulting mild left foraminal stenosis without significant canal or right  foraminal stenosis.   L5-S1: Disc height loss. Mild bilateral facet arthropathy without significant canal or foraminal stenosis.   IMPRESSION: 1. Mild left foraminal stenosis at L4-L5. No significant canal stenosis. 2. Multilevel facet arthropathy, severe on the right at L4-L5 with mild perifacet edema. 3. Moderate Levocurvature centered at L2-L3 with multilevel degenerative disease.     Electronically Signed   By: Margaretha Sheffield M.D.   On: 09/25/2021 16:20     Objective:  VS:  HT:    WT:   BMI:     BP:138/81  HR:(!) 102bpm  TEMP: ( )  RESP:  Physical Exam Vitals and nursing note reviewed.  Constitutional:      General: She is not in acute distress.    Appearance: Normal appearance. She is not ill-appearing.  HENT:     Head: Normocephalic and atraumatic.     Right Ear: External ear normal.     Left Ear: External ear normal.  Eyes:     Extraocular Movements: Extraocular movements intact.  Cardiovascular:     Rate and Rhythm: Normal rate.     Pulses: Normal pulses.  Pulmonary:     Effort: Pulmonary effort is normal. No respiratory distress.  Abdominal:     General: There is no distension.     Palpations: Abdomen is soft.  Musculoskeletal:        General: Tenderness present.  Cervical back: Neck supple.     Right lower leg: No edema.     Left lower leg: No edema.     Comments: Patient has good distal strength with no pain over the greater trochanters.  No clonus or focal weakness. Patient somewhat slow to rise from a seated position to full extension.  There is concordant low back pain with facet loading and lumbar spine extension rotation.  There are no definitive trigger points but the patient is somewhat tender across the lower back and PSIS.  There is no pain with hip rotation.   Skin:    Findings: No erythema, lesion or rash.  Neurological:     General: No focal deficit present.     Mental Status: She is alert and oriented to person, place, and time.      Sensory: No sensory deficit.     Motor: No weakness or abnormal muscle tone.     Coordination: Coordination normal.  Psychiatric:        Mood and Affect: Mood normal.        Behavior: Behavior normal.     Imaging: No results found.

## 2021-11-03 NOTE — Procedures (Signed)
Lumbar Diagnostic Facet Joint Nerve Block with Fluoroscopic Guidance   Patient: Lindsay Reyes      Date of Birth: 1951/01/12 MRN: [id number] PCP: [name], MD      Visit Date: 10/21/2021   Universal Protocol:    Date/Time: 11/28/228:25 PM  Consent Given By: the patient  Position: PRONE  Additional Comments: Vital signs were monitored before and after the procedure. Patient was prepped and draped in the usual sterile fashion. The correct patient, procedure, and site was verified.   Injection Procedure Details:   Procedure diagnoses:  1. Spondylosis without myelopathy or radiculopathy, lumbar region      Meds Administered:  Meds ordered this encounter  Medications   methylPREDNISolone acetate (DEPO-MEDROL) injection 80 mg   bupivacaine (MARCAINE) 0.25 % (with pres) injection 2 mL     Laterality: Bilateral  Location/Site: L3-L4, L2 and L3 medial branches and L4-L5, L3 and L4 medial branches  Needle: 5.0 in., 25 ga.  Short bevel or Quincke spinal needle  Needle Placement: Oblique pedical  Findings:   -Comments: There was excellent flow of contrast along the articular pillars without intravascular flow.  Procedure Details: The fluoroscope beam is vertically oriented in AP and then obliqued 15 to 20 degrees to the ipsilateral side of the desired nerve to achieve the "Scotty dog" appearance.  The skin over the target area of the junction of the superior articulating process and the transverse process (sacral ala if blocking the L5 dorsal rami) was locally anesthetized with a 1 ml volume of 1% Lidocaine without Epinephrine.  The spinal needle was inserted and advanced in a trajectory view down to the target.   After contact with periosteum and negative aspirate for blood and CSF, correct placement without intravascular or epidural spread was confirmed by injecting 0.5 ml. of Isovue-250.  A spot radiograph was obtained of this image.    Next, a 0.5 ml. volume of the  injectate described above was injected. The needle was then redirected to the other facet joint nerves mentioned above if needed.  Prior to the procedure, the patient was given a Pain Diary which was completed for baseline measurements.  After the procedure, the patient rated their pain every 30 minutes and will continue rating at this frequency for a total of 5 hours.  The patient has been asked to complete the Diary and return to [place] by mail, fax or hand delivered as soon as possible.   Additional Comments:  No complications occurred Dressing: 2 x 2 sterile gauze and Band-Aid    Post-procedure details: Patient was observed during the procedure. Post-procedure instructions were reviewed.  Patient left the clinic in stable condition.

## 2021-11-12 ENCOUNTER — Telehealth: Payer: Self-pay | Admitting: Physical Medicine and Rehabilitation

## 2021-11-12 NOTE — Telephone Encounter (Signed)
Pt called requesting a call to set an appt. Please call pt at [phone].

## 2021-11-14 NOTE — Telephone Encounter (Signed)
IC spoke with patient at length.  She did not have diary. I have scheduled her for OV.

## 2021-11-18 ENCOUNTER — Other Ambulatory Visit: Payer: Self-pay

## 2021-11-18 ENCOUNTER — Ambulatory Visit: Payer: Medicare HMO | Admitting: Physical Medicine and Rehabilitation

## 2021-11-18 ENCOUNTER — Encounter: Payer: Self-pay | Admitting: Physical Medicine and Rehabilitation

## 2021-11-18 VITALS — BP 123/83 | HR 98

## 2021-11-18 DIAGNOSIS — G8929 Other chronic pain: Secondary | ICD-10-CM

## 2021-11-18 DIAGNOSIS — M545 Low back pain, unspecified: Secondary | ICD-10-CM

## 2021-11-18 DIAGNOSIS — M4126 Other idiopathic scoliosis, lumbar region: Secondary | ICD-10-CM | POA: Diagnosis not present

## 2021-11-18 DIAGNOSIS — M47816 Spondylosis without myelopathy or radiculopathy, lumbar region: Secondary | ICD-10-CM | POA: Diagnosis not present

## 2021-11-18 NOTE — Progress Notes (Signed)
Pt state lower back pain that mostly on her left side and travels up to her ribs. Pt state walking, standing and sitting makes the pain worse. Pt state she takes over the counter pain meds to help ease her pain.  Numeric Pain Rating Scale and Functional Assessment Average Pain 8 Pain Right Now 3 My pain is intermittent, sharp, dull, and aching Pain is worse with: walking, bending, sitting, standing, and some activites Pain improves with: medication   In the last MONTH (on 0-10 scale) has pain interfered with the following?  1. General activity like being  able to carry out your everyday physical activities such as walking, climbing stairs, carrying groceries, or moving a chair?  Rating(7)  2. Relation with others like being able to carry out your usual social activities and roles such as  activities at home, at work and in your community. Rating(8)  3. Enjoyment of life such that you have  been bothered by emotional problems such as feeling anxious, depressed or irritable?  Rating(9)

## 2021-11-18 NOTE — Progress Notes (Signed)
Lindsay Reyes - 70 y.o. female MRN [id number]  Date of birth: July 30, 1951  Office Visit Note: Visit Date: 11/18/2021 PCP: [name], MD Referred by: [name], MD  Subjective: Chief Complaint  Patient presents with   Lower Back - Pain   HPI: Lindsay Reyes is a 70 y.o. female who comes in today for evaluation of chronic, worsening and severe left sided lower back pain. Patient had bilateral L3-L4 and L4-L5 facet medial branch injections and reports complete resolution of pain for 3 days after procedure. Patient states she felt considerably better and was able to function without difficulty. Patient reports pain is exacerbated by activity, movement and prolonged standing. Currently describes pain as a soreness sensation, rates as 8 out of 10. Patient reports some relief of pain with home exercise program, rest and use of medications. Patient states she has attended multiple sessions of physical therapy at [place] and reports these treatments do not significantly reduce her pain. Patient's recent lumbar MRI exhibits moderate levocurvature centered at L2-L3, multilevel facet hypertrophy, most severe on the right at L3-L4. No high grade spinal canal stenosis. Patient denies focal weakness, numbness and tingling. Patient denies recent trauma or falls.   Review of Systems  Musculoskeletal:  Positive for back pain.  Neurological:  Negative for tingling, sensory change, focal weakness and weakness.  All other systems reviewed and are negative. Otherwise per HPI.  Assessment & Plan: Visit Diagnoses:    ICD-10-CM   1. Spondylosis without myelopathy or radiculopathy, lumbar region  M47.816 Ambulatory referral to Physical Medicine Rehab    2. Chronic bilateral low back pain without sciatica  M54.50 Ambulatory referral to Physical Medicine Rehab   G89.29     3. Facet hypertrophy of lumbar region  M47.816     4. Other idiopathic scoliosis, lumbar  region  M41.26        Plan: Findings:  Chronic, worsening and severe bilateral axial back pain. Patient continues to have excruciating pain despite good conservative therapies such as formal physical therapy, home exercise regimen, rest and use of medications. Patient did report complete resolution of lower back pain for 3 days after recent bilateral L3-L4 and L4-L5 facet joint injections. We believe the next step is to repeat bilateral L3-L4 and L4-L5 facet medial branch injections under fluoroscopic guidance. We did speak with patient about possibility of longer sustained pain relief with radiofrequency ablation procedure. We do feel like she would be a candidate for radiofrequency ablation if she gets good pain relief with second set of facet joint injections. Patient encouraged to remain active and to continue home exercise regimen as tolerated. No red flag symptoms noted upon exam today.    Meds & Orders: No orders of the defined types were placed in this encounter.   Orders Placed This Encounter  Procedures   Ambulatory referral to Physical Medicine Rehab    Follow-up: Return for Bilateral L3-L4 and L4-L5 facet joint injections.   Procedures: No procedures performed      Clinical History: MRI LUMBAR SPINE WITHOUT CONTRAST   TECHNIQUE: Multiplanar, multisequence MR imaging of the lumbar spine was performed. No intravenous contrast was administered.   COMPARISON:  Lumbar radiographs Apr 10, 2021.   FINDINGS: Segmentation: 5 non rib-bearing lumbar vertebral bodies on prior radiographs.   Alignment:  Moderate Levocurvature centered at L2-L3.   Vertebrae: Scattered T1 hyperintense lesions throughout the thoracolumbar spine and sacrum, compatible with benign vertebral venous malformations. Schmorl's node involving the  inferior T12 endplate. Degenerative/discogenic endplate signal changes about the L2-L3 and L5-S1 discs. No specific evidence of acute fracture  or discitis/osteomyelitis.   Conus medullaris and cauda equina: Conus extends to the L1 level. Conus appears normal.   Paraspinal and other soft tissues: Bilateral renal cysts. Paraspinal muscular atrophy. Mild right L4-L5 inflammatory perifacet edema.   Disc levels:   T12-L1: No significant disc protrusion, foraminal stenosis, or canal stenosis.   L1-L2: Slight disc bulging without significant canal or foraminal stenosis.   L2-L3: Mild right a centric disc bulge and mild right facet arthropathy without significant canal or foraminal stenosis.   L3-L4: Disc height loss, eccentric to the right. Small left foraminal disc protrusion. Severe right and mild left facet arthropathy without significant canal or foraminal stenosis.   L4-L5: Moderate bilateral facet arthropathy. Mild disc bulging. Resulting mild left foraminal stenosis without significant canal or right foraminal stenosis.   L5-S1: Disc height loss. Mild bilateral facet arthropathy without significant canal or foraminal stenosis.   IMPRESSION: 1. Mild left foraminal stenosis at L4-L5. No significant canal stenosis. 2. Multilevel facet arthropathy, severe on the right at L4-L5 with mild perifacet edema. 3. Moderate Levocurvature centered at L2-L3 with multilevel degenerative disease.     Electronically Signed   By: Margaretha Sheffield M.D.   On: 09/25/2021 16:20   She reports that she has never smoked. She has never used smokeless tobacco. No results for input(s): HGBA1C, LABURIC in the last 8760 hours.  Objective:  VS:  HT:     WT:    BMI:      BP:123/83   HR:98bpm   TEMP: ( )   RESP:  Physical Exam Vitals and nursing note reviewed.  HENT:     Head: Normocephalic and atraumatic.     Right Ear: External ear normal.     Left Ear: External ear normal.     Nose: Nose normal.     Mouth/Throat:     Mouth: Mucous membranes are moist.  Eyes:     Extraocular Movements: Extraocular movements intact.   Cardiovascular:     Rate and Rhythm: Normal rate.     Pulses: Normal pulses.  Pulmonary:     Effort: Pulmonary effort is normal.  Abdominal:     General: Abdomen is flat. There is no distension.  Musculoskeletal:        General: Tenderness present.     Cervical back: Normal range of motion.     Comments: Pt rises from seated position to standing without difficulty. Concordant low back pain with facet loading, lumbar spine extension and rotation. Strong distal strength without clonus, no pain upon palpation of greater trochanters. Sensation intact bilaterally. Walks independently, gait steady.   Skin:    General: Skin is warm and dry.     Capillary Refill: Capillary refill takes less than 2 seconds.  Neurological:     General: No focal deficit present.     Mental Status: She is alert.  Psychiatric:        Mood and Affect: Mood normal.    Ortho Exam  Imaging: No results found.  Past Medical/Family/Surgical/Social History: Medications & Allergies reviewed per EMR, new medications updated. 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   Urinary incontinence 01/18/2018   LOW BACK PAIN, CHRONIC 12/12/2007   DIVERTICULOSIS, COLON W/O HEM 08/25/2007   GERD 04/15/2007   Past Medical History:  Diagnosis Date   Arthritis    Colon polyp    Depression    Diverticulosis    GERD (gastroesophageal reflux disease)    Hyperlipidemia    Insomnia    Retinal micro-aneurysm of right eye    Scoliosis    Family History   Problem Relation Age of Onset   Hypertension Mother    Diabetes Mother    Osteoporosis Mother    Heart Problems Mother    Thyroid cancer Sister    Colon cancer Neg Hx    Past Surgical History:  Procedure Laterality Date   ABDOMINAL HYSTERECTOMY  2002   COLONOSCOPY     Social History   Occupational History   Not on file  Tobacco Use   Smoking status: Never   Smokeless tobacco: Never  Vaping Use   Vaping Use: Never used  Substance and Sexual Activity   Alcohol use: Not Currently    Comment: socially   Drug use: No   Sexual activity: Not on file

## 2021-11-26 ENCOUNTER — Other Ambulatory Visit: Payer: Self-pay

## 2021-11-26 ENCOUNTER — Ambulatory Visit (INDEPENDENT_AMBULATORY_CARE_PROVIDER_SITE_OTHER): Payer: Medicare HMO | Admitting: Family Medicine

## 2021-11-26 ENCOUNTER — Encounter: Payer: Self-pay | Admitting: Family Medicine

## 2021-11-26 VITALS — BP 118/72 | HR 87 | Temp 97.9°F | Ht 64.0 in | Wt 142.4 lb

## 2021-11-26 DIAGNOSIS — K219 Gastro-esophageal reflux disease without esophagitis: Secondary | ICD-10-CM | POA: Diagnosis not present

## 2021-11-26 DIAGNOSIS — J029 Acute pharyngitis, unspecified: Secondary | ICD-10-CM

## 2021-11-26 MED ORDER — AZITHROMYCIN 250 MG PO TABS: ORAL_TABLET | ORAL | 0 refills | Status: DC

## 2021-11-26 NOTE — Progress Notes (Signed)
° °  Marigold Mom is a 70 y.o. female who presents today for an office visit.  Assessment/Plan:  New/Acute Problems: Sore Throat Reassuring that symptoms have essentially resolved.  She still has a mild lymphadenopathy.  Likely had a viral URI.  We will send in pocket prescription for azithromycin with instruction about start unless symptoms are not improving over the next few days.  Reassured patient regarding her lymphadenopathy and discussed that it should resolve over the next couple of weeks.  She will let me know if it is still persistent or worsening over the next couple of weeks.  Chronic Problems Addressed Today: GERD Stable on protonix 40mg  daily.     Subjective:  HPI:  See A/P for status of chronic conditions.  She has noticed sore throat for the last week or so.  Symptoms have significantly improved today.  She is also noticed a swollen lymph node under her right jawline.  No other symptoms.  No fevers or chills.  No congestion.  No rhinorrhea or postnasal drip.  She has been noticed that she has been clearing her throat a little bit more.  Her pain was significant a few days ago and hurt even with swallowing saliva.  Took a home COVID test which was negative.        Objective:  Physical Exam: BP 118/72    Pulse 87    Temp 97.9 F (36.6 C) (Temporal)    Ht 5\' 4"  (1.626 m)    Wt 142 lb 6.4 oz (64.6 kg)    SpO2 98%    BMI 24.44 kg/m   Gen: No acute distress, resting comfortably HEENT: TMs clear. OP clear.  Swollen lymph node on right submandibular area. CV: Regular rate and rhythm with no murmurs appreciated Pulm: Normal work of breathing, clear to auscultation bilaterally with no crackles, wheezes, or rhonchi Neuro: Grossly normal, moves all extremities Psych: Normal affect and thought content      Delrico Minehart M. [name], MD 11/26/2021 9:39 AM

## 2021-11-26 NOTE — Assessment & Plan Note (Signed)
Stable on protonix 40mg  daily.

## 2021-11-26 NOTE — Patient Instructions (Signed)
It was very nice to see you today!  I am glad that you are starting to feel better.  I will send in a prescription for azithromycin.  Please start this if your symptoms do not improve over the next several days.  Please let us know if your lymph node is still swollen in the next few weeks.  Take care, Dr [name]  PLEASE NOTE:  If you had any lab tests please let us know if you have not heard back within a few days. You may see your results on mychart before we have a chance to review them but we will give you a call once they are reviewed by [place]. If we ordered any referrals today, please let us know if you have not heard from their office within the next week.   Please try these tips to maintain a healthy lifestyle:  Eat at least 3 REAL meals and 1-2 snacks per day.  Aim for no more than 5 hours between eating.  If you eat breakfast, please do so within one hour of getting up.   Each meal should contain half fruits/vegetables, one quarter protein, and one quarter carbs (no bigger than a computer mouse)  Cut down on sweet beverages. This includes juice, soda, and sweet tea.   Drink at least 1 glass of water with each meal and aim for at least 8 glasses per day  Exercise at least 150 minutes every week.

## 2021-11-27 ENCOUNTER — Other Ambulatory Visit: Payer: Self-pay | Admitting: Family Medicine

## 2021-11-27 IMAGING — DX DG HIP (WITH OR WITHOUT PELVIS) 2-3V*R*
3 series · 3 of 3 positions shown · non-contrast
Comparison: CT abdomen and pelvis 05/03/2017

CLINICAL DATA: Chronic RIGHT side low back pain extending to SI
joint region, pain radiating to lateral aspect of RIGHT hip

EXAM:
DG HIP (WITH OR WITHOUT PELVIS) 2-3V RIGHT

[pelvis ap]
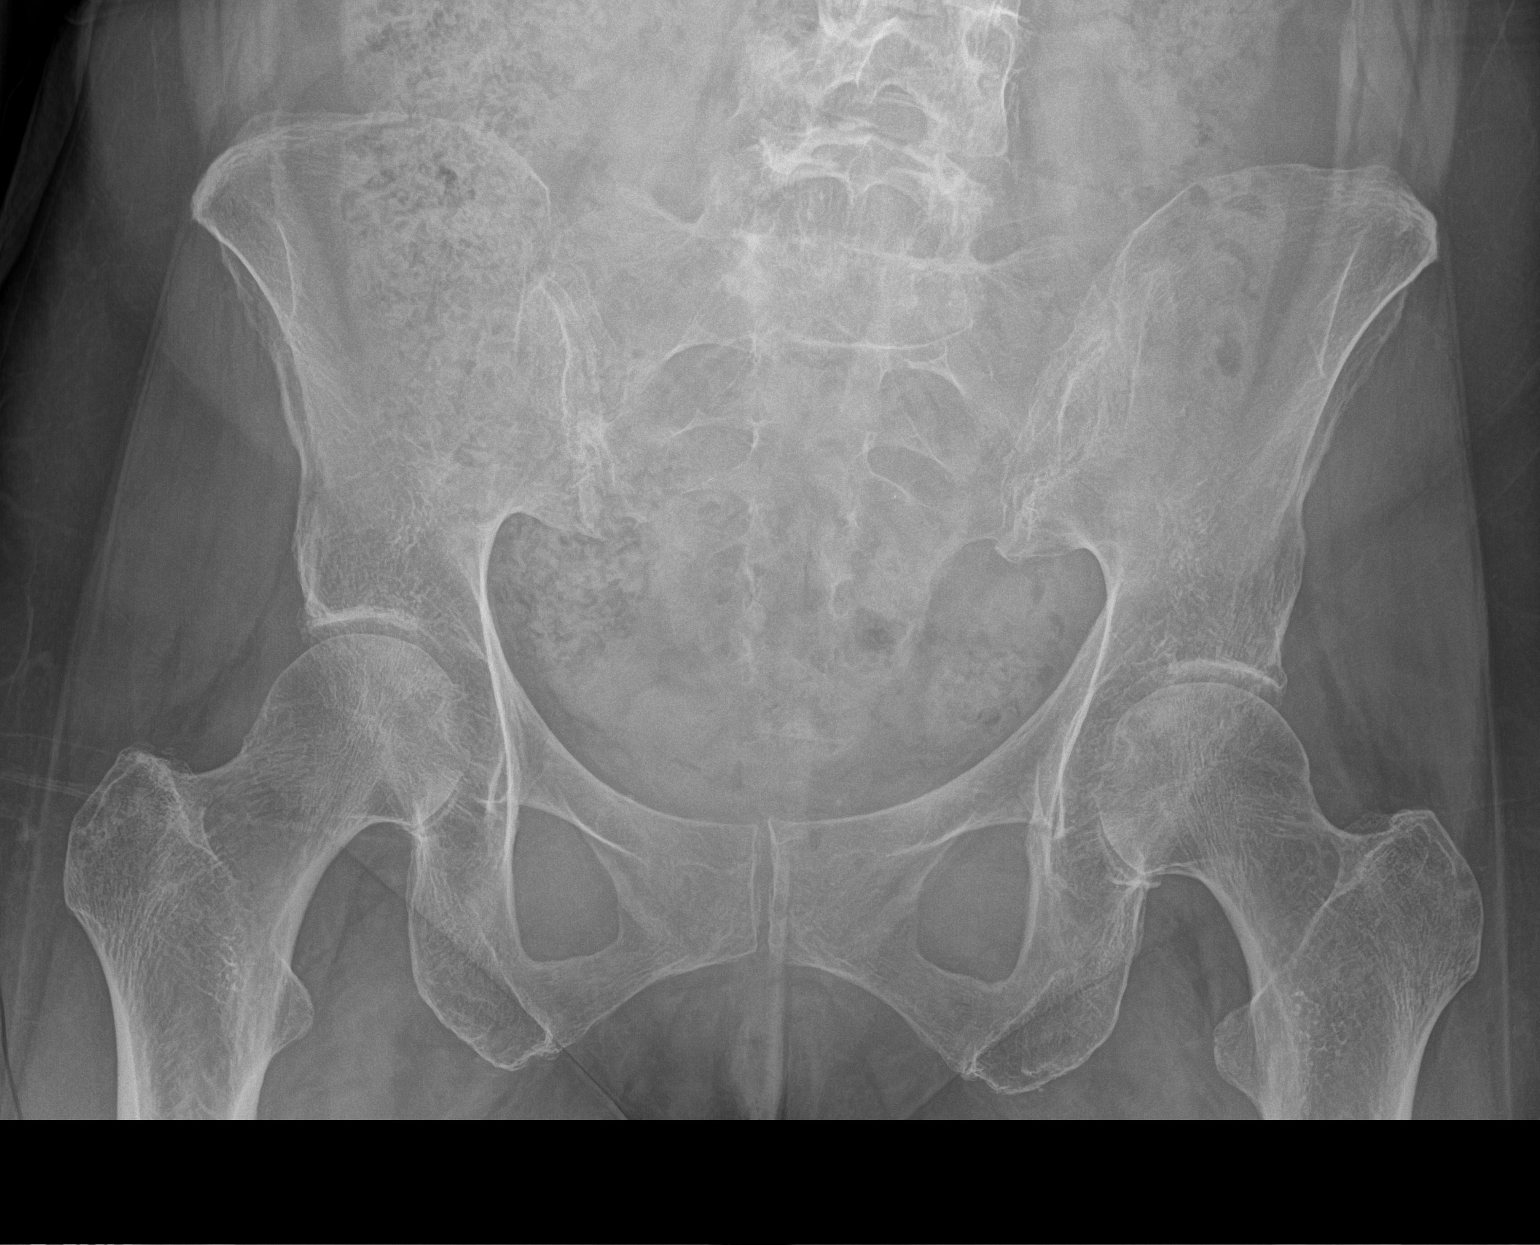

[hip ap]
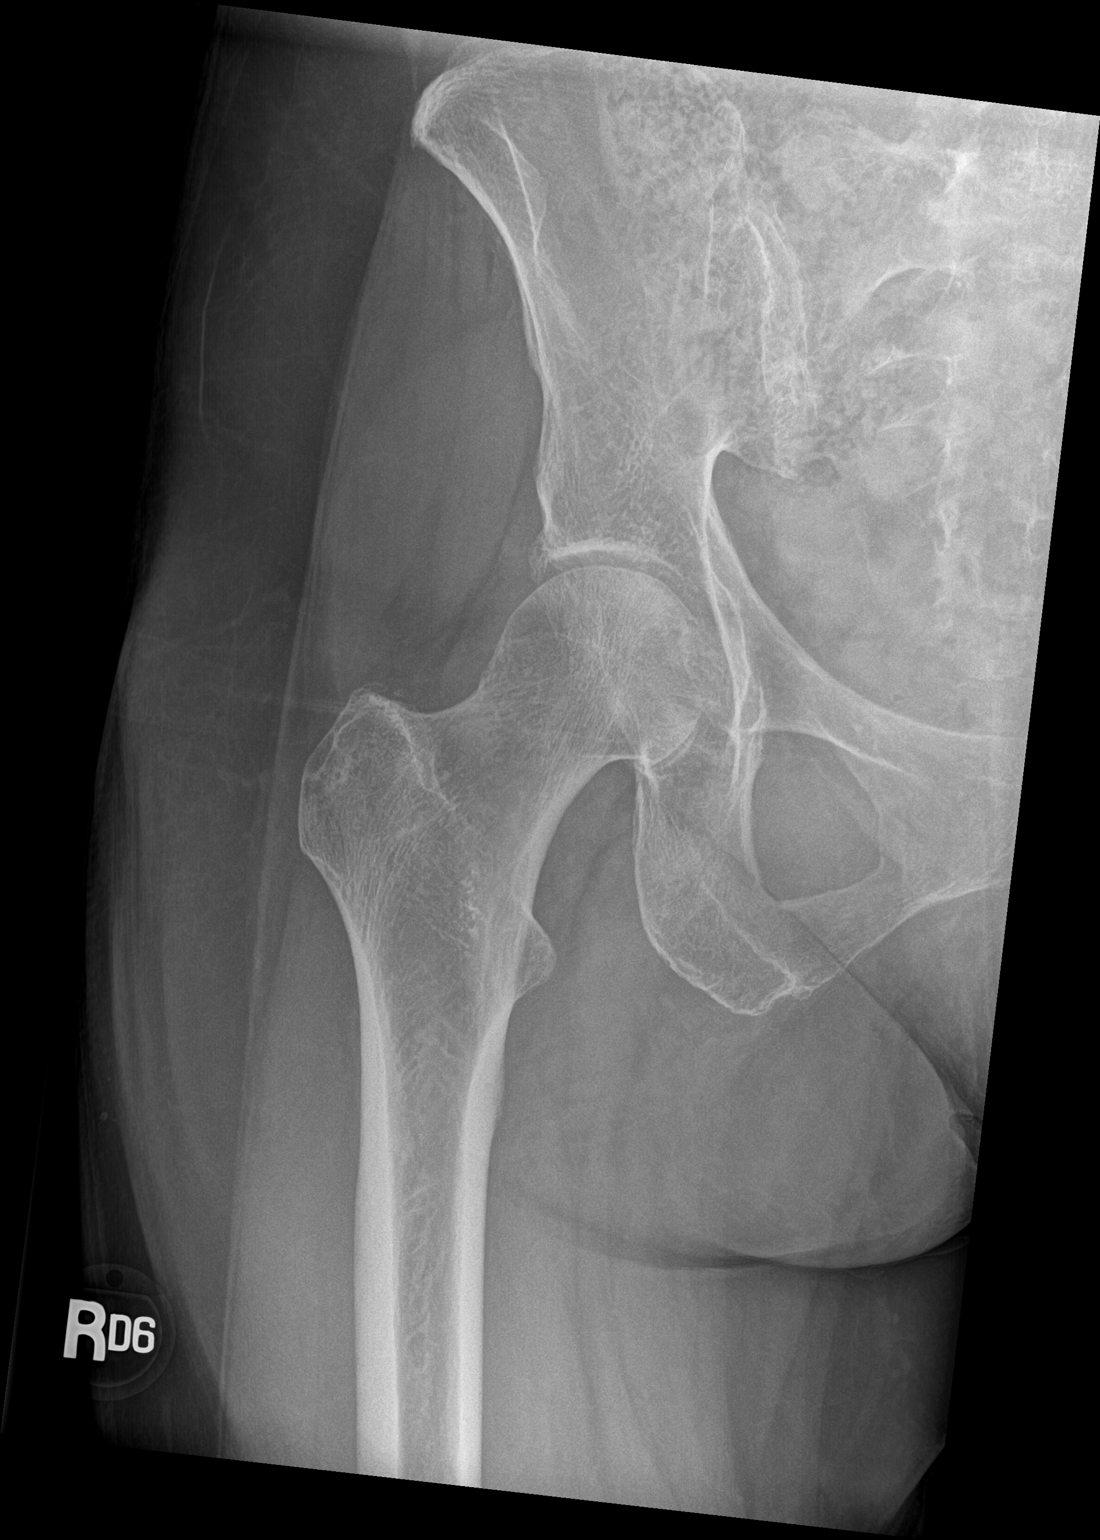

[hip frog leg]
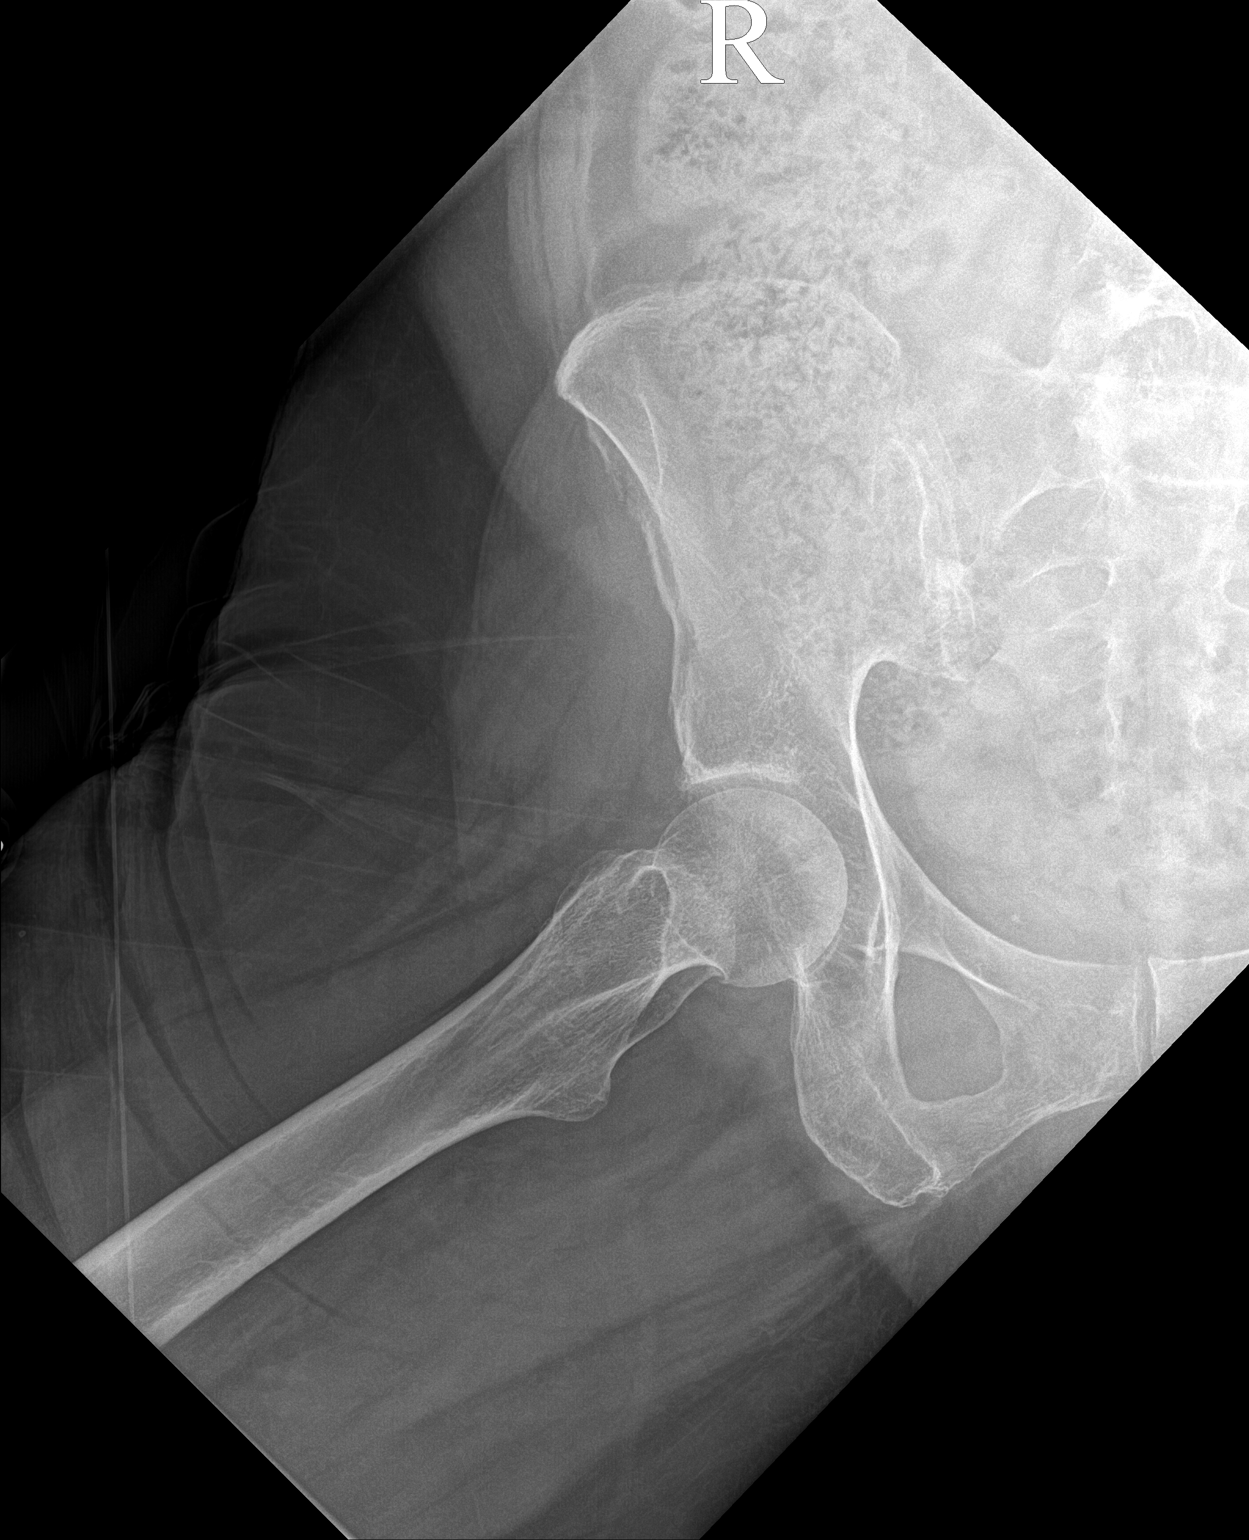

[3 of 3 positions shown; findings below may reference images not displayed]

FINDINGS: Diffuse osseous demineralization.

Hip and SI joint spaces preserved.

No acute fracture, dislocation, or bone destruction.

Facet degenerative changes at visualized lower lumbar spine.
IMPRESSION: Osseous demineralization with facet degenerative changes at
visualized lower lumbar spine.

No acute RIGHT hip abnormalities.

## 2021-12-11 ENCOUNTER — Encounter: Payer: Self-pay | Admitting: Physical Medicine and Rehabilitation

## 2021-12-11 ENCOUNTER — Ambulatory Visit: Payer: Self-pay

## 2021-12-11 ENCOUNTER — Other Ambulatory Visit: Payer: Self-pay

## 2021-12-11 ENCOUNTER — Ambulatory Visit (INDEPENDENT_AMBULATORY_CARE_PROVIDER_SITE_OTHER): Payer: Medicare HMO | Admitting: Physical Medicine and Rehabilitation

## 2021-12-11 VITALS — BP 118/80 | HR 116

## 2021-12-11 DIAGNOSIS — M47816 Spondylosis without myelopathy or radiculopathy, lumbar region: Secondary | ICD-10-CM

## 2021-12-11 MED ORDER — BUPIVACAINE HCL 0.5 % IJ SOLN
3.0000 mL | Freq: Once | INTRAMUSCULAR | Status: AC
  Administered 2021-12-11: 3 mL

## 2021-12-11 NOTE — Progress Notes (Signed)
Pt state lower back pain that mostly on her left side and travels up to her ribs. Pt state walking, standing and sitting makes the pain worse. Pt state she takes over the counter pain meds to help ease her pain.  Numeric Pain Rating Scale and Functional Assessment Average Pain 0   In the last MONTH (on 0-10 scale) has pain interfered with the following?  1. General activity like being  able to carry out your everyday physical activities such as walking, climbing stairs, carrying groceries, or moving a chair?  Rating(5)   +Driver, -BT, -Dye Allergies.

## 2021-12-11 NOTE — Patient Instructions (Signed)

## 2021-12-16 ENCOUNTER — Encounter: Payer: Self-pay | Admitting: Physical Medicine and Rehabilitation

## 2021-12-16 ENCOUNTER — Other Ambulatory Visit: Payer: Self-pay

## 2021-12-16 ENCOUNTER — Ambulatory Visit (INDEPENDENT_AMBULATORY_CARE_PROVIDER_SITE_OTHER): Payer: Medicare HMO | Admitting: Physical Medicine and Rehabilitation

## 2021-12-16 VITALS — BP 133/83 | HR 105

## 2021-12-16 DIAGNOSIS — M7918 Myalgia, other site: Secondary | ICD-10-CM | POA: Diagnosis not present

## 2021-12-16 DIAGNOSIS — M545 Low back pain, unspecified: Secondary | ICD-10-CM | POA: Diagnosis not present

## 2021-12-16 DIAGNOSIS — G8929 Other chronic pain: Secondary | ICD-10-CM | POA: Diagnosis not present

## 2021-12-16 DIAGNOSIS — M4126 Other idiopathic scoliosis, lumbar region: Secondary | ICD-10-CM | POA: Diagnosis not present

## 2021-12-16 DIAGNOSIS — M461 Sacroiliitis, not elsewhere classified: Secondary | ICD-10-CM | POA: Diagnosis not present

## 2021-12-16 DIAGNOSIS — M47816 Spondylosis without myelopathy or radiculopathy, lumbar region: Secondary | ICD-10-CM | POA: Diagnosis not present

## 2021-12-16 NOTE — Progress Notes (Signed)
Lindsay Reyes - 71 y.o. female MRN [id number]  Date of birth: 1951/11/02  Office Visit Note: Visit Date: 12/16/2021 PCP: [name], MD Referred by: [name], MD  Subjective: Chief Complaint  Patient presents with   Lower Back - Pain   HPI: Lindsay Reyes is a 71 y.o. female who comes in today for evaluation of chronic, worsening and severe bilateral lower back pain. Patient also reports bilateral buttock pain radiating to lateral aspects of hips. Patient recently had bilateral L3-L4 and L4-L5 facet joint/medial branch blocks on 12/11/2021 and reports greater than 80% pain relief for several days. Patient reports pain is exacerbated by activity, movement and prolonged standing. Patient describes pain as a constant soreness, currently rates as 8 out of 10.  Patient reports some relief of pain with home exercise regimen, rest and use of medications.  Patient states Lindsay Reyes has attended multiple sessions of formal physical therapy at [facility] and reports these treatments do not help to alleviate her pain. Patient's recent lumbar MRI exhibits moderate levocurvature centered at L2-L3, multilevel facet hypertrophy, most severe on the right at L3-L4. No high grade spinal canal stenosis.  Patient denies focal weakness, numbness and tingling.  Patient denies recent trauma or falls.  Patient also reports chronic, worsening and severe bilateral buttock pain radiating to lateral aspects of hips that has been ongoing for several years. Patient reports pain is most severe with sitting and laying on her back.  Patient states Lindsay Reyes has to change positions and stand frequently throughout the day to help alleviate her pain.  Patient currently rates pain as 7 out of 10.  Patient's MRI of her pelvis from 2020 exhibits abnormal marrow edema into lower coccygeal segments which could indicative of a healing coccygeal fracture/bony injury, or chronic stress injury. Patient's recent  bilateral hip x-ray images exhibit no acute hip abnormalities, sacroiliac joints preserved.  To her knowledge patient has not attended formal physical therapy for this chronic bilateral buttock pain in the past.  Review of Systems  Musculoskeletal:  Positive for back pain and myalgias.  Neurological:  Negative for tingling, sensory change, focal weakness and weakness.  All other systems reviewed and are negative. Otherwise per HPI.  Assessment & Plan: Visit Diagnoses:    ICD-10-CM   1. Spondylosis without myelopathy or radiculopathy, lumbar region  M47.816 Ambulatory referral to Physical Medicine Rehab    2. Chronic bilateral low back pain without sciatica  M54.50 Ambulatory referral to Physical Medicine Rehab   G89.29     3. Facet hypertrophy of lumbar region  M47.816     4. Other idiopathic scoliosis, lumbar region  M41.26     5. Bilateral buttock pain  M79.18     6. Bilateral sacroiliitis ([place])  M46.1        Plan: Findings:  1.  Chronic, worsening and severe bilateral lower back pain. Greater than 80% relief of pain with recent bilateral L3-L4 and L4-L5 facet joint/medial branch blocks. Patient continues to have excruciating pain despite good conservative therapy such as home exercise regimen, rest and use of medications. Patient's clinical presentation and exam are consistent with facet mediated pain.  We feel the next step is to perform a bilateral L3-L4 and L4-L5 radiofrequency ablation under fluoroscopic guidance.  We did discuss the radiofrequency ablation procedure in detail today and patient has no questions at this time.  Patient encouraged to remain active and to continue home exercise program as tolerated.  No red flag  symptoms noted upon exam today.  2.  Chronic, worsening and severe bilateral buttock pain radiating to lateral aspects of hips.  Patient continues to have severe pain despite good conservative therapy such as home exercise regimen and use of medications.   Patient does have tenderness upon palpation of bilateral gluteus maximus and greater trochanter regions.  Patient is sensitive to touch in these areas.  Patient's clinical presentation and exam are consistent with sacroiliitis, however Lindsay Reyes clearly seems to have some myofascial pain syndrome of this area as well as a possible central sensitization pain syndrome such as fibromyalgia.  We believe the next step is to proceed with radiofrequency ablation, as indicated in the first paragraph, to see if this helps with her lower back and buttock discomfort, however if this chronic bilateral buttock pain remains we would consider performing bilateral sacroiliac joint injections if warranted or repeat pelvic MRI performed in 2020..   Meds & Orders: No orders of the defined types were placed in this encounter.   Orders Placed This Encounter  Procedures   Ambulatory referral to Physical Medicine Rehab    Follow-up: Return for Bilateral L3-L4 and L4-L5 radiofrequency ablation.   Procedures: No procedures performed      Clinical History: MRI LUMBAR SPINE WITHOUT CONTRAST   TECHNIQUE: Multiplanar, multisequence MR imaging of the lumbar spine was performed. No intravenous contrast was administered.   COMPARISON:  Lumbar radiographs Apr 10, 2021.   FINDINGS: Segmentation: 5 non rib-bearing lumbar vertebral bodies on prior radiographs.   Alignment:  Moderate Levocurvature centered at L2-L3.   Vertebrae: Scattered T1 hyperintense lesions throughout the thoracolumbar spine and sacrum, compatible with benign vertebral venous malformations. Schmorl's node involving the inferior T12 endplate. Degenerative/discogenic endplate signal changes about the L2-L3 and L5-S1 discs. No specific evidence of acute fracture or discitis/osteomyelitis.   Conus medullaris and cauda equina: Conus extends to the L1 level. Conus appears normal.   Paraspinal and other soft tissues: Bilateral renal cysts.  Paraspinal muscular atrophy. Mild right L4-L5 inflammatory perifacet edema.   Disc levels:   T12-L1: No significant disc protrusion, foraminal stenosis, or canal stenosis.   L1-L2: Slight disc bulging without significant canal or foraminal stenosis.   L2-L3: Mild right a centric disc bulge and mild right facet arthropathy without significant canal or foraminal stenosis.   L3-L4: Disc height loss, eccentric to the right. Small left foraminal disc protrusion. Severe right and mild left facet arthropathy without significant canal or foraminal stenosis.   L4-L5: Moderate bilateral facet arthropathy. Mild disc bulging. Resulting mild left foraminal stenosis without significant canal or right foraminal stenosis.   L5-S1: Disc height loss. Mild bilateral facet arthropathy without significant canal or foraminal stenosis.   IMPRESSION: 1. Mild left foraminal stenosis at L4-L5. No significant canal stenosis. 2. Multilevel facet arthropathy, severe on the right at L4-L5 with mild perifacet edema. 3. Moderate Levocurvature centered at L2-L3 with multilevel degenerative disease.     Electronically Signed   By: Margaretha Sheffield M.D.   On: 09/25/2021 16:20   Lindsay Reyes reports that Lindsay Reyes has never smoked. Lindsay Reyes has never used smokeless tobacco. No results for input(s): HGBA1C, LABURIC in the last 8760 hours.  Objective:  VS:  HT:     WT:    BMI:      BP:133/83   HR:(!) 105bpm   TEMP: ( )   RESP:  Physical Exam Vitals and nursing note reviewed.  HENT:     Head: Normocephalic and atraumatic.     Right Ear:  External ear normal.     Left Ear: External ear normal.     Nose: Nose normal.     Mouth/Throat:     Mouth: Mucous membranes are moist.  Eyes:     Extraocular Movements: Extraocular movements intact.  Cardiovascular:     Rate and Rhythm: Normal rate.     Pulses: Normal pulses.  Pulmonary:     Effort: Pulmonary effort is normal.  Abdominal:     General: Abdomen is flat. There is no  distension.  Musculoskeletal:        General: Tenderness present.     Cervical back: Normal range of motion.     Comments: Pt is slow to rise from seated position to standing. Concordant low back pain with facet loading, lumbar spine extension and rotation. Strong distal strength without clonus, no pain upon palpation of greater trochanters. Sensation intact bilaterally. Walks independently, gait steady. Tenderness noted upon palpation of bilateral gluteal maximus and greater trochanter region.   Skin:    General: Skin is warm and dry.     Capillary Refill: Capillary refill takes less than 2 seconds.  Neurological:     General: No focal deficit present.     Mental Status: Lindsay Reyes is alert and oriented to person, place, and time.  Psychiatric:        Mood and Affect: Mood normal.        Behavior: Behavior normal.    Ortho Exam  Imaging: No results found.  Past Medical/Family/Surgical/Social History: Medications & Allergies reviewed per EMR, new medications updated. 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   Osteopenia 05/03/2018   Allergic rhinitis 04/01/2018   Insomnia 03/01/2018   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   Urinary incontinence 01/18/2018   LOW BACK PAIN, CHRONIC 12/12/2007   DIVERTICULOSIS, COLON W/O HEM 08/25/2007   GERD 04/15/2007   Past Medical History:  Diagnosis Date   Arthritis    Colon polyp    Depression    Diverticulosis    GERD (gastroesophageal reflux disease)    Hyperlipidemia    Insomnia    Retinal micro-aneurysm of right eye    Scoliosis    Family History  Problem Relation Age of Onset    Hypertension Mother    Diabetes Mother    Osteoporosis Mother    Heart Problems Mother    Thyroid cancer Sister    Colon cancer Neg Hx    Past Surgical History:  Procedure Laterality Date   ABDOMINAL HYSTERECTOMY  2002   COLONOSCOPY     Social History   Occupational History   Not on file  Tobacco Use   Smoking status: Never   Smokeless tobacco: Never  Vaping Use   Vaping Use: Never used  Substance and Sexual Activity   Alcohol use: Not Currently    Comment: socially   Drug use: No   Sexual activity: Not on file

## 2021-12-16 NOTE — Progress Notes (Signed)
Pt state lower back pain. Pt has hx of inj on 12/11/21 pt state it help but she can't do much with it. Pt state she still having issue with shopping due to the pain can get worse. Pt state she takes over the counter pain meds to help ease her pain.  Numeric Pain Rating Scale and Functional Assessment Average Pain 5 Pain Right Now 1 My pain is intermittent, dull, and aching Pain is worse with: walking, standing, and some activites Pain improves with: medication and injections   In the last MONTH (on 0-10 scale) has pain interfered with the following?  1. General activity like being  able to carry out your everyday physical activities such as walking, climbing stairs, carrying groceries, or moving a chair?  Rating(7)  2. Relation with others like being able to carry out your usual social activities and roles such as  activities at home, at work and in your community. Rating(8)  3. Enjoyment of life such that you have  been bothered by emotional problems such as feeling anxious, depressed or irritable?  Rating(9)

## 2021-12-17 ENCOUNTER — Telehealth: Payer: Self-pay | Admitting: Physical Medicine and Rehabilitation

## 2021-12-17 NOTE — Telephone Encounter (Signed)
Patient requesting call back to set up appointment for her ablasions.

## 2022-01-05 NOTE — Procedures (Signed)
Lumbar Diagnostic Facet Joint Nerve Block with Fluoroscopic Guidance   Patient: Lindsay Reyes      Date of Birth: [birth year] MRN: [id number] PCP: [name], MD      Visit Date: 12/11/2021   Universal Protocol:    Date/Time: 01/30/235:27 AM  Consent Given By: the patient  Position: PRONE  Additional Comments: Vital signs were monitored before and after the procedure. Patient was prepped and draped in the usual sterile fashion. The correct patient, procedure, and site was verified.   Injection Procedure Details:   Procedure diagnoses:  1. Spondylosis without myelopathy or radiculopathy, lumbar region      Meds Administered:  Meds ordered this encounter  Medications   bupivacaine (MARCAINE) 0.5 % (with pres) injection 3 mL     Laterality: Bilateral  Location/Site: L3-L4, L2 and L3 medial branches and L4-L5, L3 and L4 medial branches  Needle: 5.0 in., 25 ga.  Short bevel or Quincke spinal needle  Needle Placement: Oblique pedical  Findings:   -Comments: There was excellent flow of contrast along the articular pillars without intravascular flow.  Procedure Details: The fluoroscope beam is vertically oriented in AP and then obliqued 15 to 20 degrees to the ipsilateral side of the desired nerve to achieve the Scotty dog appearance.  The skin over the target area of the junction of the superior articulating process and the transverse process (sacral ala if blocking the L5 dorsal rami) was locally anesthetized with a 1 ml volume of 1% Lidocaine without Epinephrine.  The spinal needle was inserted and advanced in a trajectory view down to the target.   After contact with periosteum and negative aspirate for blood and CSF, correct placement without intravascular or epidural spread was confirmed by injecting 0.5 ml. of Isovue-250.  A spot radiograph was obtained of this image.    Next, a 0.5 ml. volume of the injectate described above was injected. The needle was then  redirected to the other facet joint nerves mentioned above if needed.  Prior to the procedure, the patient was given a Pain Diary which was completed for baseline measurements.  After the procedure, the patient rated their pain every 30 minutes and will continue rating at this frequency for a total of 5 hours.  The patient has been asked to complete the Diary and return to [place] by mail, fax or hand delivered as soon as possible.   Additional Comments:  The patient tolerated the procedure well Dressing: 2 x 2 sterile gauze and Band-Aid    Post-procedure details: Patient was observed during the procedure. Post-procedure instructions were reviewed.  Patient left the clinic in stable condition.

## 2022-01-05 NOTE — Progress Notes (Signed)
Lindsay Reyes - 71 y.o. female MRN [id number]  Date of birth: 1951/06/08  Office Visit Note: Visit Date: 12/11/2021 PCP: [name], MD Referred by: [name], MD  Subjective: Chief Complaint  Patient presents with   Lower Back - Pain   HPI:  Lindsay Reyes is a 71 y.o. female who comes in today for planned repeat Bilateral L3-4 and L4-5 Lumbar facet/medial branch block with fluoroscopic guidance.  The patient has failed conservative care including home exercise, medications, time and activity modification.  This injection will be diagnostic and hopefully therapeutic.  Please see requesting physician notes for further details and justification.  Exam shows concordant low back pain with facet joint loading and extension. Patient received more than 80% pain relief from prior injection. This would be the second block in a diagnostic double block paradigm.     Referring:Dr. [name]   ROS Otherwise per HPI.  Assessment & Plan: Visit Diagnoses:    ICD-10-CM   1. Spondylosis without myelopathy or radiculopathy, lumbar region  M47.816 XR C-ARM NO REPORT    Facet Injection    bupivacaine (MARCAINE) 0.5 % (with pres) injection 3 mL      Plan: No additional findings.   Meds & Orders:  Meds ordered this encounter  Medications   bupivacaine (MARCAINE) 0.5 % (with pres) injection 3 mL    Orders Placed This Encounter  Procedures   Facet Injection   XR C-ARM NO REPORT    Follow-up: Return for Review Pain Diary.   Procedures: No procedures performed  Lumbar Diagnostic Facet Joint Nerve Block with Fluoroscopic Guidance   Patient: Lindsay Reyes      Date of Birth: 1951/11/06 MRN: [id number] PCP: [name], MD      Visit Date: 12/11/2021   Universal Protocol:    Date/Time: 01/30/235:27 AM  Consent Given By: the patient  Position: PRONE  Additional Comments: Vital signs were monitored before and after the procedure. Patient was prepped and draped in the  usual sterile fashion. The correct patient, procedure, and site was verified.   Injection Procedure Details:   Procedure diagnoses:  1. Spondylosis without myelopathy or radiculopathy, lumbar region      Meds Administered:  Meds ordered this encounter  Medications   bupivacaine (MARCAINE) 0.5 % (with pres) injection 3 mL     Laterality: Bilateral  Location/Site: L3-L4, L2 and L3 medial branches and L4-L5, L3 and L4 medial branches  Needle: 5.0 in., 25 ga.  Short bevel or Quincke spinal needle  Needle Placement: Oblique pedical  Findings:   -Comments: There was excellent flow of contrast along the articular pillars without intravascular flow.  Procedure Details: The fluoroscope beam is vertically oriented in AP and then obliqued 15 to 20 degrees to the ipsilateral side of the desired nerve to achieve the Scotty dog appearance.  The skin over the target area of the junction of the superior articulating process and the transverse process (sacral ala if blocking the L5 dorsal rami) was locally anesthetized with a 1 ml volume of 1% Lidocaine without Epinephrine.  The spinal needle was inserted and advanced in a trajectory view down to the target.   After contact with periosteum and negative aspirate for blood and CSF, correct placement without intravascular or epidural spread was confirmed by injecting 0.5 ml. of Isovue-250.  A spot radiograph was obtained of this image.    Next, a 0.5 ml. volume of the injectate described above was injected. The needle was then  redirected to the other facet joint nerves mentioned above if needed.  Prior to the procedure, the patient was given a Pain Diary which was completed for baseline measurements.  After the procedure, the patient rated their pain every 30 minutes and will continue rating at this frequency for a total of 5 hours.  The patient has been asked to complete the Diary and return to [place] by mail, fax or hand delivered as soon as  possible.   Additional Comments:  The patient tolerated the procedure well Dressing: 2 x 2 sterile gauze and Band-Aid    Post-procedure details: Patient was observed during the procedure. Post-procedure instructions were reviewed.  Patient left the clinic in stable condition.    Clinical History: MRI LUMBAR SPINE WITHOUT CONTRAST   TECHNIQUE: Multiplanar, multisequence MR imaging of the lumbar spine was performed. No intravenous contrast was administered.   COMPARISON:  Lumbar radiographs Apr 10, 2021.   FINDINGS: Segmentation: 5 non rib-bearing lumbar vertebral bodies on prior radiographs.   Alignment:  Moderate Levocurvature centered at L2-L3.   Vertebrae: Scattered T1 hyperintense lesions throughout the thoracolumbar spine and sacrum, compatible with benign vertebral venous malformations. Schmorl's node involving the inferior T12 endplate. Degenerative/discogenic endplate signal changes about the L2-L3 and L5-S1 discs. No specific evidence of acute fracture or discitis/osteomyelitis.   Conus medullaris and cauda equina: Conus extends to the L1 level. Conus appears normal.   Paraspinal and other soft tissues: Bilateral renal cysts. Paraspinal muscular atrophy. Mild right L4-L5 inflammatory perifacet edema.   Disc levels:   T12-L1: No significant disc protrusion, foraminal stenosis, or canal stenosis.   L1-L2: Slight disc bulging without significant canal or foraminal stenosis.   L2-L3: Mild right a centric disc bulge and mild right facet arthropathy without significant canal or foraminal stenosis.   L3-L4: Disc height loss, eccentric to the right. Small left foraminal disc protrusion. Severe right and mild left facet arthropathy without significant canal or foraminal stenosis.   L4-L5: Moderate bilateral facet arthropathy. Mild disc bulging. Resulting mild left foraminal stenosis without significant canal or right foraminal stenosis.   L5-S1: Disc height  loss. Mild bilateral facet arthropathy without significant canal or foraminal stenosis.   IMPRESSION: 1. Mild left foraminal stenosis at L4-L5. No significant canal stenosis. 2. Multilevel facet arthropathy, severe on the right at L4-L5 with mild perifacet edema. 3. Moderate Levocurvature centered at L2-L3 with multilevel degenerative disease.     Electronically Signed   By: Margaretha Sheffield M.D.   On: 09/25/2021 16:20     Objective:  VS:  HT:     WT:    BMI:      BP:118/80   HR:(!) 116bpm   TEMP: ( )   RESP:  Physical Exam Vitals and nursing note reviewed.  Constitutional:      General: She is not in acute distress.    Appearance: Normal appearance. She is not ill-appearing.  HENT:     Head: Normocephalic and atraumatic.     Right Ear: External ear normal.     Left Ear: External ear normal.  Eyes:     Extraocular Movements: Extraocular movements intact.  Cardiovascular:     Rate and Rhythm: Normal rate.     Pulses: Normal pulses.  Pulmonary:     Effort: Pulmonary effort is normal. No respiratory distress.  Abdominal:     General: There is no distension.     Palpations: Abdomen is soft.  Musculoskeletal:        General: Tenderness present.  Cervical back: Neck supple.     Right lower leg: No edema.     Left lower leg: No edema.     Comments: Patient has good distal strength with no pain over the greater trochanters.  No clonus or focal weakness. Patient somewhat slow to rise from a seated position to full extension.  There is concordant low back pain with facet loading and lumbar spine extension rotation.  There are no definitive trigger points but the patient is somewhat tender across the lower back and PSIS.  There is no pain with hip rotation.   Skin:    Findings: No erythema, lesion or rash.  Neurological:     General: No focal deficit present.     Mental Status: She is alert and oriented to person, place, and time.     Sensory: No sensory deficit.     Motor:  No weakness or abnormal muscle tone.     Coordination: Coordination normal.  Psychiatric:        Mood and Affect: Mood normal.        Behavior: Behavior normal.     Imaging: No results found.

## 2022-01-08 ENCOUNTER — Other Ambulatory Visit: Payer: Self-pay | Admitting: Family Medicine

## 2022-01-21 ENCOUNTER — Encounter: Payer: Medicare HMO | Admitting: Physical Medicine and Rehabilitation

## 2022-01-28 ENCOUNTER — Ambulatory Visit (INDEPENDENT_AMBULATORY_CARE_PROVIDER_SITE_OTHER): Payer: Medicare HMO | Admitting: Physical Medicine and Rehabilitation

## 2022-01-28 ENCOUNTER — Other Ambulatory Visit: Payer: Self-pay

## 2022-01-28 ENCOUNTER — Encounter: Payer: Self-pay | Admitting: Physical Medicine and Rehabilitation

## 2022-01-28 ENCOUNTER — Ambulatory Visit: Payer: Self-pay

## 2022-01-28 VITALS — BP 129/80 | HR 70 | Ht 64.0 in | Wt 142.0 lb

## 2022-01-28 DIAGNOSIS — M47816 Spondylosis without myelopathy or radiculopathy, lumbar region: Secondary | ICD-10-CM | POA: Diagnosis not present

## 2022-01-28 MED ORDER — METHYLPREDNISOLONE ACETATE 80 MG/ML IJ SUSP
80.0000 mg | Freq: Once | INTRAMUSCULAR | Status: AC
  Administered 2022-01-28: 80 mg

## 2022-01-28 NOTE — Patient Instructions (Signed)

## 2022-01-28 NOTE — Progress Notes (Signed)
Today is a good day.No pain

## 2022-01-29 NOTE — Procedures (Signed)
Lumbar Facet Joint Nerve Denervation  Patient: Lindsay Reyes      Date of Birth: 07-03-1951 MRN: [id number] PCP: [name], MD      Visit Date: 01/28/2022   Universal Protocol:    Date/Time: 02/23/235:54 AM  Consent Given By: the patient  Position: PRONE  Additional Comments: Vital signs were monitored before and after the procedure. Patient was prepped and draped in the usual sterile fashion. The correct patient, procedure, and site was verified.   Injection Procedure Details:   Procedure diagnoses:  1. Spondylosis without myelopathy or radiculopathy, lumbar region      Meds Administered:  Meds ordered this encounter  Medications   methylPREDNISolone acetate (DEPO-MEDROL) injection 80 mg     Laterality: Right  Location/Site:  L3-L4, L2 and L3 medial branches and L4-L5, L3 and L4 medial branches  Needle: 18 ga.,  69mm active tip, 171mm RF Cannula  Needle Placement: Along juncture of superior articular process and transverse pocess  Findings:  -Comments:  Procedure Details: For each desired target nerve, the corresponding transverse process (sacral ala for the L5 dorsal rami) was identified and the fluoroscope was positioned to square off the endplates of the corresponding vertebral body to achieve a true AP midline view.  The beam was then obliqued 15 to 20 degrees and caudally tilted 15 to 20 degrees to line up a trajectory along the target nerves. The skin over the target of the junction of superior articulating process and transverse process (sacral ala for the L5 dorsal rami) was infiltrated with 10ml of 1% Lidocaine without Epinephrine.  The 18 gauge 29mm active tip outer cannula was advanced in trajectory view to the target.  This procedure was repeated for each target nerve.  Then, for all levels, the outer cannula placement was fine-tuned and the position was then confirmed with bi-planar imaging.    Test stimulation was done both at sensory and motor  levels to ensure there was no radicular stimulation. The target tissues were then infiltrated with 1 ml of 1% Lidocaine without Epinephrine. Subsequently, a percutaneous neurotomy was carried out for 90 seconds at 80 degrees Celsius.  After the completion of the lesion, 1 ml of injectate was delivered. It was then repeated for each facet joint nerve mentioned above. Appropriate radiographs were obtained to verify the probe placement during the neurotomy.   Additional Comments:  The patient tolerated the procedure well Dressing: 2 x 2 sterile gauze and Band-Aid    Post-procedure details: Patient was observed during the procedure. Post-procedure instructions were reviewed.  Patient left the clinic in stable condition.

## 2022-01-29 NOTE — Progress Notes (Signed)
Lindsay Reyes - 71 y.o. female MRN [id number]  Date of birth: 12/04/1951  Office Visit Note: Visit Date: 01/28/2022 PCP: [name], MD Referred by: [name], MD  Subjective: Chief Complaint  Patient presents with   Lower Back - Pain   HPI:  Erla Bacchi is a 71 y.o. female who comes in todayfor planned radiofrequency ablation of the Right L3-4 and L4-5 Lumbar facet joints. This would be ablation of the corresponding medial branches and/or dorsal rami.  Patient has had double diagnostic blocks with more than 50% relief.  These are documented on pain diary.  They have had chronic back pain for quite some time, more than 3 months, which has been an ongoing situation with recalcitrant axial back pain.  They have no radicular pain.  Their axial pain is worse with standing and ambulating and on exam today with facet loading.  They have had physical therapy as well as home exercise program.  The imaging noted in the chart below indicated facet pathology. Accordingly they meet all the criteria and qualification for for radiofrequency ablation and we are going to complete this today hopefully for more longer term relief as part of comprehensive management program.   ROS Otherwise per HPI.  Assessment & Plan: Visit Diagnoses:    ICD-10-CM   1. Spondylosis without myelopathy or radiculopathy, lumbar region  M47.816 XR C-ARM NO REPORT    Radiofrequency,Lumbar    methylPREDNISolone acetate (DEPO-MEDROL) injection 80 mg      Plan: No additional findings.   Meds & Orders:  Meds ordered this encounter  Medications   methylPREDNISolone acetate (DEPO-MEDROL) injection 80 mg    Orders Placed This Encounter  Procedures   Radiofrequency,Lumbar   XR C-ARM NO REPORT    Follow-up: Return if symptoms worsen or fail to improve.   Procedures: No procedures performed  Lumbar Facet Joint Nerve Denervation  Patient: Calvary Difranco      Date of Birth: [birth year] MRN: [id number] PCP:  [name], MD      Visit Date: 01/28/2022   Universal Protocol:    Date/Time: 02/23/235:54 AM  Consent Given By: the patient  Position: PRONE  Additional Comments: Vital signs were monitored before and after the procedure. Patient was prepped and draped in the usual sterile fashion. The correct patient, procedure, and site was verified.   Injection Procedure Details:   Procedure diagnoses:  1. Spondylosis without myelopathy or radiculopathy, lumbar region      Meds Administered:  Meds ordered this encounter  Medications   methylPREDNISolone acetate (DEPO-MEDROL) injection 80 mg     Laterality: Right  Location/Site:  L3-L4, L2 and L3 medial branches and L4-L5, L3 and L4 medial branches  Needle: 18 ga.,  73mm active tip, 164mm RF Cannula  Needle Placement: Along juncture of superior articular process and transverse pocess  Findings:  -Comments:  Procedure Details: For each desired target nerve, the corresponding transverse process (sacral ala for the L5 dorsal rami) was identified and the fluoroscope was positioned to square off the endplates of the corresponding vertebral body to achieve a true AP midline view.  The beam was then obliqued 15 to 20 degrees and caudally tilted 15 to 20 degrees to line up a trajectory along the target nerves. The skin over the target of the junction of superior articulating process and transverse process (sacral ala for the L5 dorsal rami) was infiltrated with 74ml of 1% Lidocaine without Epinephrine.  The 18 gauge 26mm active tip outer cannula  was advanced in trajectory view to the target.  This procedure was repeated for each target nerve.  Then, for all levels, the outer cannula placement was fine-tuned and the position was then confirmed with bi-planar imaging.    Test stimulation was done both at sensory and motor levels to ensure there was no radicular stimulation. The target tissues were then infiltrated with 1 ml of 1% Lidocaine  without Epinephrine. Subsequently, a percutaneous neurotomy was carried out for 90 seconds at 80 degrees Celsius.  After the completion of the lesion, 1 ml of injectate was delivered. It was then repeated for each facet joint nerve mentioned above. Appropriate radiographs were obtained to verify the probe placement during the neurotomy.   Additional Comments:  The patient tolerated the procedure well Dressing: 2 x 2 sterile gauze and Band-Aid    Post-procedure details: Patient was observed during the procedure. Post-procedure instructions were reviewed.  Patient left the clinic in stable condition.       Clinical History: MRI LUMBAR SPINE WITHOUT CONTRAST   TECHNIQUE: Multiplanar, multisequence MR imaging of the lumbar spine was performed. No intravenous contrast was administered.   COMPARISON:  Lumbar radiographs Apr 10, 2021.   FINDINGS: Segmentation: 5 non rib-bearing lumbar vertebral bodies on prior radiographs.   Alignment:  Moderate Levocurvature centered at L2-L3.   Vertebrae: Scattered T1 hyperintense lesions throughout the thoracolumbar spine and sacrum, compatible with benign vertebral venous malformations. Schmorl's node involving the inferior T12 endplate. Degenerative/discogenic endplate signal changes about the L2-L3 and L5-S1 discs. No specific evidence of acute fracture or discitis/osteomyelitis.   Conus medullaris and cauda equina: Conus extends to the L1 level. Conus appears normal.   Paraspinal and other soft tissues: Bilateral renal cysts. Paraspinal muscular atrophy. Mild right L4-L5 inflammatory perifacet edema.   Disc levels:   T12-L1: No significant disc protrusion, foraminal stenosis, or canal stenosis.   L1-L2: Slight disc bulging without significant canal or foraminal stenosis.   L2-L3: Mild right a centric disc bulge and mild right facet arthropathy without significant canal or foraminal stenosis.   L3-L4: Disc height loss, eccentric  to the right. Small left foraminal disc protrusion. Severe right and mild left facet arthropathy without significant canal or foraminal stenosis.   L4-L5: Moderate bilateral facet arthropathy. Mild disc bulging. Resulting mild left foraminal stenosis without significant canal or right foraminal stenosis.   L5-S1: Disc height loss. Mild bilateral facet arthropathy without significant canal or foraminal stenosis.   IMPRESSION: 1. Mild left foraminal stenosis at L4-L5. No significant canal stenosis. 2. Multilevel facet arthropathy, severe on the right at L4-L5 with mild perifacet edema. 3. Moderate Levocurvature centered at L2-L3 with multilevel degenerative disease.     Electronically Signed   By: Margaretha Sheffield M.D.   On: 09/25/2021 16:20     Objective:  VS:  HT:5\' 4"  (162.6 cm)    WT:142 lb (64.4 kg)   BMI:24.36     BP:129/80   HR:70bpm   TEMP: ( )   RESP:  Physical Exam Vitals and nursing note reviewed.  Constitutional:      General: She is not in acute distress.    Appearance: Normal appearance. She is not ill-appearing.  HENT:     Head: Normocephalic and atraumatic.     Right Ear: External ear normal.     Left Ear: External ear normal.  Eyes:     Extraocular Movements: Extraocular movements intact.  Cardiovascular:     Rate and Rhythm: Normal rate.  Pulses: Normal pulses.  Pulmonary:     Effort: Pulmonary effort is normal. No respiratory distress.  Abdominal:     General: There is no distension.     Palpations: Abdomen is soft.  Musculoskeletal:        General: Tenderness present.     Cervical back: Neck supple.     Right lower leg: No edema.     Left lower leg: No edema.     Comments: Patient has good distal strength with no pain over the greater trochanters.  No clonus or focal weakness. Patient somewhat slow to rise from a seated position to full extension.  There is concordant low back pain with facet loading and lumbar spine extension rotation.  There  are no definitive trigger points but the patient is somewhat tender across the lower back and PSIS.  There is no pain with hip rotation.   Skin:    Findings: No erythema, lesion or rash.  Neurological:     General: No focal deficit present.     Mental Status: She is alert and oriented to person, place, and time.     Sensory: No sensory deficit.     Motor: No weakness or abnormal muscle tone.     Coordination: Coordination normal.  Psychiatric:        Mood and Affect: Mood normal.        Behavior: Behavior normal.     Imaging: XR C-ARM NO REPORT  Result Date: 01/28/2022 Please see Notes tab for imaging impression.

## 2022-02-03 ENCOUNTER — Other Ambulatory Visit: Payer: Self-pay | Admitting: Gastroenterology

## 2022-02-04 ENCOUNTER — Other Ambulatory Visit: Payer: Self-pay

## 2022-02-04 ENCOUNTER — Ambulatory Visit: Payer: Self-pay

## 2022-02-04 ENCOUNTER — Encounter: Payer: Self-pay | Admitting: Physical Medicine and Rehabilitation

## 2022-02-04 ENCOUNTER — Ambulatory Visit (INDEPENDENT_AMBULATORY_CARE_PROVIDER_SITE_OTHER): Payer: Medicare HMO | Admitting: Physical Medicine and Rehabilitation

## 2022-02-04 VITALS — BP 123/85 | HR 77

## 2022-02-04 DIAGNOSIS — M47816 Spondylosis without myelopathy or radiculopathy, lumbar region: Secondary | ICD-10-CM

## 2022-02-04 MED ORDER — METHYLPREDNISOLONE ACETATE 80 MG/ML IJ SUSP
80.0000 mg | Freq: Once | INTRAMUSCULAR | Status: AC
  Administered 2022-02-04: 80 mg

## 2022-02-04 NOTE — Progress Notes (Signed)
Pt state lower back pain.Pt state walking, standing and  shopping makes the pain worse. Pt state she takes over the counter pain meds to help ease her pain. ? ?Numeric Pain Rating Scale and Functional Assessment ?Average Pain 0 ? ? ?In the last MONTH (on 0-10 scale) has pain interfered with the following? ? ?1. General activity like being  able to carry out your everyday physical activities such as walking, climbing stairs, carrying groceries, or moving a chair?  ?Rating(7) ? ? ?+Driver, -BT, -Dye Allergies. ? ?

## 2022-02-04 NOTE — Patient Instructions (Signed)

## 2022-02-09 ENCOUNTER — Telehealth: Payer: Self-pay | Admitting: Radiology

## 2022-02-09 NOTE — Telephone Encounter (Signed)
Patient states that she had ablation on 02/04/2022 and wants to know how long the pain will last.  She is in a lot of pain and can hardly bend over to plug something in to a socket. She requests return call to advise. ? ?CB [phone] ?

## 2022-02-09 NOTE — Telephone Encounter (Signed)
I called patient and advised. She states that it has been 7 days and she is having significant pain. She was not sure if this was normal. I advised, per Dr. Romona Curls message, she should not be having anything other than muscle pain. She has appt 3/29 but may need to be seen before. Please call patient to schedule office visit per Dr. Romona Curls advice. ?Thanks. ?

## 2022-02-12 ENCOUNTER — Ambulatory Visit: Payer: Medicare HMO | Admitting: Physical Medicine and Rehabilitation

## 2022-02-12 ENCOUNTER — Other Ambulatory Visit: Payer: Self-pay

## 2022-02-12 ENCOUNTER — Encounter: Payer: Self-pay | Admitting: Physical Medicine and Rehabilitation

## 2022-02-12 VITALS — BP 150/83 | HR 96

## 2022-02-12 DIAGNOSIS — M47816 Spondylosis without myelopathy or radiculopathy, lumbar region: Secondary | ICD-10-CM

## 2022-02-12 DIAGNOSIS — M797 Fibromyalgia: Secondary | ICD-10-CM

## 2022-02-12 DIAGNOSIS — G8929 Other chronic pain: Secondary | ICD-10-CM | POA: Diagnosis not present

## 2022-02-12 DIAGNOSIS — M545 Low back pain, unspecified: Secondary | ICD-10-CM | POA: Diagnosis not present

## 2022-02-12 DIAGNOSIS — M7918 Myalgia, other site: Secondary | ICD-10-CM

## 2022-02-12 MED ORDER — HYDROCODONE-ACETAMINOPHEN 5-325 MG PO TABS: 1.0000 | ORAL_TABLET | Freq: Two times a day (BID) | ORAL | 0 refills | Status: AC | PRN

## 2022-02-12 MED ORDER — DULOXETINE HCL 30 MG PO CPEP: ORAL_CAPSULE | ORAL | 1 refills | Status: DC

## 2022-02-12 NOTE — Progress Notes (Signed)
Pt state hx of inj on 02/04/22 pt state it didn't help. Pt state lower back pain that travels to her buttocks and both hips.Pt state walking, standing and  shopping makes the pain worse. Pt state she takes over the counter pain meds to help ease her pain. ? ?Numeric Pain Rating Scale and Functional Assessment ?Average Pain 10 ?Pain Right Now 10 ?My pain is constant, burning, and aching ?Pain is worse with: walking, sitting, standing, and some activites ?Pain improves with: heat/ice, medication, and injections ? ? ?In the last MONTH (on 0-10 scale) has pain interfered with the following? ? ?1. General activity like being  able to carry out your everyday physical activities such as walking, climbing stairs, carrying groceries, or moving a chair?  ?Rating(7) ? ?2. Relation with others like being able to carry out your usual social activities and roles such as  activities at home, at work and in your community. ?Rating(8) ? ?3. Enjoyment of life such that you have  been bothered by emotional problems such as feeling anxious, depressed or irritable?  ?Rating(9) ? ?

## 2022-02-12 NOTE — Progress Notes (Signed)
Lindsay Reyes - 71 y.o. female MRN [id number]  Date of birth: February 11, 1951  Office Visit Note: Visit Date: 02/12/2022 PCP: [name], MD Referred by: [name], MD  Subjective: Chief Complaint  Patient presents with   Lower Back - Pain   HPI: Lindsay Reyes is a 71 y.o. female who comes in today for evaluation of chronic, worsening and severe bilateral lower back pain radiating to buttocks, also reports diffuse pain that radiates up her back and around to hips. Patient had bilateral L3-L4 and L4-L5 radiofrequency ablation performed in our office on 02/04/2022 and reports pain significantly increased days after her procedure. Patient states her pain is exacerbated by movement and activity, states she has pain with everything she does at this point. She describes her pain as a constant sore and tender sensation, currently rates pain as 10 out of 10. Patient states she continues home exercise regimen and is taking Ibuprofen and Tylenol as needed, however no treatments seem to help alleviate her pain. She does recall taking Vicodin several years ago and reports significant relief of pain with this medication. Patient has attended formal physical therapy in the past at [place] and reports minimal relief of pain with these treatments. Patients lumbar MRI from 2022 exhibits moderate levocurvature centered at L2-L3, multi-level facet arthropathy most severe on the right at L4-L5. No high grade spinal canal stenosis noted. Patient states she is having difficulty performing daily tasks due to severe pain. Patient denies focal weakness, numbness and tingling. Patient denies recent trauma or falls.   Review of Systems  Musculoskeletal:  Positive for back pain and myalgias.  Neurological:  Negative for tingling, sensory change, focal weakness and weakness.  All other systems reviewed and are negative. Otherwise per HPI.  Assessment & Plan: Visit Diagnoses:     ICD-10-CM   1. Spondylosis without myelopathy or radiculopathy, lumbar region  M47.816 Ambulatory referral to Physical Therapy    2. Chronic bilateral low back pain without sciatica  M54.50 Ambulatory referral to Physical Therapy   G89.29     3. Facet hypertrophy of lumbar region  M47.816 Ambulatory referral to Physical Therapy    4. Myofascial pain syndrome  M79.18 Ambulatory referral to Physical Therapy    5. Fibromyalgia  M79.7 Ambulatory referral to Physical Therapy       Plan: Findings:  Chronic, worsening and severe bilateral lower back pain radiating to buttocks. She also has diffuse pain radiating up her back and around to hips. Patient continues to have excruciating and debilitating pain despite good conservative therapies such as formal physical therapy, home exercise regimen and use of medications. 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 We believe fibromyalgia could be working to exacerbate her symptoms. We feel the next step is to place order for formal physical therapy, patient would prefer to return to [facility] location. We also discussed medication management with patient today in detail and I did prescribe a short course of Norco for patient to take as needed every 12 hours for moderate/severe pain. I did explain to her that we do not manage chronic pain long term and if she is interested in continuing narcotic pain medications we are happy to refer her to chronic pain management. I also placed a prescription for Cymbalta today and instructed her to take this once a day for 2 weeks and then can titrate up if tolerating. We would  like to see patient back in office in approximately 4 weeks for reassessment and to discuss medications. She is encouraged to remain active and to continue home exercise regimen as tolerated. No red flag symptoms noted upon exam today.    Meds & Orders:   Meds ordered this encounter  Medications   HYDROcodone-acetaminophen (NORCO/VICODIN) 5-325 MG tablet    Sig: Take 1 tablet by mouth every 12 (twelve) hours as needed for up to 5 days for moderate pain or severe pain.    Dispense:  15 tablet    Refill:  0    Order Specific Question:   Supervising Provider    Answer:   Dennard Nip   DULoxetine (CYMBALTA) 30 MG capsule    Sig: Take 1 capsule (30 mg total) once a day by mouth for 2 weeks, then take 1 capsule (30 mg) twice a day.    Dispense:  60 capsule    Refill:  1    Order Specific Question:   Supervising Provider    Answer:   Magnus Sinning [[id number]]    Orders Placed This Encounter  Procedures   Ambulatory referral to Physical Therapy    Follow-up: Return for 4 week follow up for reassessment.   Procedures: No procedures performed      Clinical History: EXAM: MRI LUMBAR SPINE WITHOUT CONTRAST   TECHNIQUE: Multiplanar, multisequence MR imaging of the lumbar spine was performed. No intravenous contrast was administered.   COMPARISON:  Lumbar radiographs Apr 10, 2021.   FINDINGS: Segmentation: 5 non rib-bearing lumbar vertebral bodies on prior radiographs.   Alignment:  Moderate Levocurvature centered at L2-L3.   Vertebrae: Scattered T1 hyperintense lesions throughout the thoracolumbar spine and sacrum, compatible with benign vertebral venous malformations. Schmorl's node involving the inferior T12 endplate. Degenerative/discogenic endplate signal changes about the L2-L3 and L5-S1 discs. No specific evidence of acute fracture or discitis/osteomyelitis.   Conus medullaris and cauda equina: Conus extends to the L1 level. Conus appears normal.   Paraspinal and other soft tissues: Bilateral renal cysts. Paraspinal muscular atrophy. Mild right L4-L5 inflammatory perifacet edema.   Disc levels:   T12-L1: No significant disc protrusion, foraminal stenosis, or canal stenosis.   L1-L2: Slight disc  bulging without significant canal or foraminal stenosis.   L2-L3: Mild right a centric disc bulge and mild right facet arthropathy without significant canal or foraminal stenosis.   L3-L4: Disc height loss, eccentric to the right. Small left foraminal disc protrusion. Severe right and mild left facet arthropathy without significant canal or foraminal stenosis.   L4-L5: Moderate bilateral facet arthropathy. Mild disc bulging. Resulting mild left foraminal stenosis without significant canal or right foraminal stenosis.   L5-S1: Disc height loss. Mild bilateral facet arthropathy without significant canal or foraminal stenosis.   IMPRESSION: 1. Mild left foraminal stenosis at L4-L5. No significant canal stenosis. 2. Multilevel facet arthropathy, severe on the right at L4-L5 with mild perifacet edema. 3. Moderate Levocurvature centered at L2-L3 with multilevel degenerative disease.     Electronically Signed   By: Margaretha Sheffield M.D.   On: 09/25/2021 16:20   She reports that she has never smoked. She has never used smokeless tobacco. No results for input(s): HGBA1C, LABURIC in the last 8760 hours.  Objective:  VS:  HT:     WT:    BMI:      BP:(!) 150/83   HR:96bpm   TEMP: ( )   RESP:  Physical Exam Vitals and nursing note reviewed.  HENT:  Head: Normocephalic and atraumatic.     Right Ear: External ear normal.     Left Ear: External ear normal.     Nose: Nose normal.     Mouth/Throat:     Mouth: Mucous membranes are moist.  Eyes:     Pupils: Pupils are equal, round, and reactive to light.  Cardiovascular:     Rate and Rhythm: Normal rate.     Pulses: Normal pulses.  Pulmonary:     Effort: Pulmonary effort is normal.  Abdominal:     General: Abdomen is flat. There is no distension.  Musculoskeletal:        General: Tenderness present.     Cervical back: Normal range of motion.  Skin:    General: Skin is warm and dry.     Capillary Refill: Capillary refill takes  less than 2 seconds.  Neurological:     General: No focal deficit present.     Mental Status: She is alert and oriented to person, place, and time.  Psychiatric:        Mood and Affect: Mood normal.        Behavior: Behavior normal.    Ortho Exam  Imaging: No results found.  Past Medical/Family/Surgical/Social History: Medications & Allergies reviewed per EMR, new medications updated. 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   Osteopenia 05/03/2018   Allergic rhinitis 04/01/2018   Insomnia 03/01/2018   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   Urinary incontinence 01/18/2018   LOW BACK PAIN, CHRONIC 12/12/2007   DIVERTICULOSIS, COLON W/O HEM 08/25/2007   GERD 04/15/2007   Past Medical History:  Diagnosis Date   Arthritis    Colon polyp    Depression    Diverticulosis    GERD (gastroesophageal reflux disease)    Hyperlipidemia    Insomnia    Retinal micro-aneurysm of right eye    Scoliosis    Family History  Problem Relation Age of Onset   Hypertension Mother    Diabetes Mother    Osteoporosis Mother    Heart Problems Mother    Thyroid cancer Sister    Colon cancer Neg Hx    Past Surgical History:  Procedure Laterality Date   ABDOMINAL HYSTERECTOMY  2002   COLONOSCOPY     Social History   Occupational History   Not on file  Tobacco Use   Smoking status: Never   Smokeless tobacco: Never  Vaping Use   Vaping Use: Never used  Substance and Sexual Activity   Alcohol use: Not Currently    Comment: socially   Drug use: No   Sexual activity: Not on file

## 2022-02-15 MED ORDER — METHYLPREDNISOLONE ACETATE 80 MG/ML IJ SUSP
80.0000 mg | INTRAMUSCULAR | Status: AC | PRN
  Administered 2022-02-04: 80 mg via INTRA_ARTICULAR

## 2022-02-15 MED ORDER — BUPIVACAINE HCL 0.25 % IJ SOLN
4.0000 mL | INTRAMUSCULAR | Status: AC | PRN
  Administered 2022-02-04: 4 mL via INTRA_ARTICULAR

## 2022-02-15 NOTE — Progress Notes (Signed)
Lindsay Reyes - 71 y.o. female MRN [id number]  Date of birth: Sep 12, 1951  Office Visit Note: Visit Date: 02/04/2022 PCP: Ardith Dark, MD Referred by: Ardith Dark, MD  Subjective: Chief Complaint  Patient presents with   Lower Back - Pain   HPI:  [name] is a 71 y.o. female who comes in todayfor planned radiofrequency ablation of the Left L3-4 and L4-5 Lumbar facet joints. This would be ablation of the corresponding medial branches and/or dorsal rami.  Patient has had double diagnostic blocks with more than 50% relief.  These are documented on pain diary.  They have had chronic back pain for quite some time, more than 3 months, which has been an ongoing situation with recalcitrant axial back pain.  They have no radicular pain.  Their axial pain is worse with standing and ambulating and on exam today with facet loading.  They have had physical therapy as well as home exercise program.  The imaging noted in the chart below indicated facet pathology. Accordingly they meet all the criteria and qualification for for radiofrequency ablation and we are going to complete this today hopefully for more longer term relief as part of comprehensive management program.   ROS Otherwise per HPI.  Assessment & Plan: Visit Diagnoses:    ICD-10-CM   1. Spondylosis without myelopathy or radiculopathy, lumbar region  M47.816 XR C-ARM NO REPORT    Epidural Steroid injection    methylPREDNISolone acetate (DEPO-MEDROL) injection 80 mg      Plan: No additional findings.   Meds & Orders:  Meds ordered this encounter  Medications   methylPREDNISolone acetate (DEPO-MEDROL) injection 80 mg    Orders Placed This Encounter  Procedures   Large Joint Inj   XR C-ARM NO REPORT   Epidural Steroid injection    Follow-up: Return if symptoms worsen or fail to improve.   Procedures: Lumbar spine radiofrequency ablation on 02/04/2022 4:00 PM Indications: pain Details: (18-gauge cannula) Needle length  (in): 100 mm. needle, fluoroscopy-guided posterior approach  Arthrogram: No  Medications: 4 mL bupivacaine 0.25 %; 80 mg methylPREDNISolone acetate 80 MG/ML  Lumbar Facet Joint Nerve Denervation  Patient: Lindsay Reyes      Date of Birth: 02/16/1951 MRN: [id number] PCP: Ardith Dark, MD      Visit Date:    Universal Protocol:    Date/Time: 03/12/233:15 PM  Consent Given By: the patient  Position: PRONE  Additional Comments: Vital signs were monitored before and after the procedure. Patient was prepped and draped in the usual sterile fashion. The correct patient, procedure, and site was verified.   Injection Procedure Details:   Procedure diagnoses: Spondylosis without myelopathy or radiculopathy, lumbar region  (primary encounter diagnosis)   Meds Administered: Orders Placed This Encounter     methylPREDNISolone acetate (DEPO-MEDROL) injection 80 mg    Laterality: Left  Location/Site:  L3-L4, L2 and L3 medial branches and L4-L5, L3 and L4 medial branches  Needle: 18 ga.,  10mm active tip, RF Cannula  Needle Placement: Along juncture of superior articular process and transverse pocess  Findings:  -Comments:  Procedure Details: For each desired target nerve, the corresponding transverse process (sacral ala for the L5 dorsal rami) was identified and the fluoroscope was positioned to square off the endplates of the corresponding vertebral body to achieve a true AP midline view.  The beam was then obliqued 15 to 20 degrees and caudally tilted 15 to 20 degrees to line up a trajectory along the target  nerves. The skin over the target of the junction of superior articulating process and transverse process (sacral ala for the L5 dorsal rami) was infiltrated with 1ml of 1% Lidocaine without Epinephrine.  The 18 gauge 10mm active tip outer cannula was advanced in trajectory view to the target.  This procedure was repeated for each target nerve.  Then, for all levels, the  outer cannula placement was fine-tuned and the position was then confirmed with bi-planar imaging.    Test stimulation was done both at sensory and motor levels to ensure there was no radicular stimulation. The target tissues were then infiltrated with 1 ml of 1% Lidocaine without Epinephrine. Subsequently, a percutaneous neurotomy was carried out for 90 seconds at 80 degrees Celsius.  After the completion of the lesion, 1 ml of injectate was delivered. It was then repeated for each facet joint nerve mentioned above. Appropriate radiographs were obtained to verify the probe placement during the neurotomy.   Additional Comments:  Patient did not tolerate needling through the musculature very well at all.  This seem to be significantly increased pain such as trigger point type pain from needling.  The pain generated was not near the spine at all.  She did tolerate the ablation procedure itself very well.  Dressing: 2 x 2 sterile gauze and Band-Aid    Post-procedure details: Patient was observed during the procedure. Post-procedure instructions were reviewed.  Patient left the clinic in stable condition.      Procedure, treatment alternatives, risks and benefits explained, specific risks discussed. Consent was given by the patient. Immediately prior to procedure a time out was called to verify the correct patient, procedure, equipment, support staff and site/side marked as required. Patient was prepped and draped in the usual sterile fashion.         Clinical History: EXAM: MRI LUMBAR SPINE WITHOUT CONTRAST   TECHNIQUE: Multiplanar, multisequence MR imaging of the lumbar spine was performed. No intravenous contrast was administered.   COMPARISON:  Lumbar radiographs Apr 10, 2021.   FINDINGS: Segmentation: 5 non rib-bearing lumbar vertebral bodies on prior radiographs.   Alignment:  Moderate Levocurvature centered at L2-L3.   Vertebrae: Scattered T1 hyperintense lesions throughout  the thoracolumbar spine and sacrum, compatible with benign vertebral venous malformations. Schmorl's node involving the inferior T12 endplate. Degenerative/discogenic endplate signal changes about the L2-L3 and L5-S1 discs. No specific evidence of acute fracture or discitis/osteomyelitis.   Conus medullaris and cauda equina: Conus extends to the L1 level. Conus appears normal.   Paraspinal and other soft tissues: Bilateral renal cysts. Paraspinal muscular atrophy. Mild right L4-L5 inflammatory perifacet edema.   Disc levels:   T12-L1: No significant disc protrusion, foraminal stenosis, or canal stenosis.   L1-L2: Slight disc bulging without significant canal or foraminal stenosis.   L2-L3: Mild right a centric disc bulge and mild right facet arthropathy without significant canal or foraminal stenosis.   L3-L4: Disc height loss, eccentric to the right. Small left foraminal disc protrusion. Severe right and mild left facet arthropathy without significant canal or foraminal stenosis.   L4-L5: Moderate bilateral facet arthropathy. Mild disc bulging. Resulting mild left foraminal stenosis without significant canal or right foraminal stenosis.   L5-S1: Disc height loss. Mild bilateral facet arthropathy without significant canal or foraminal stenosis.   IMPRESSION: 1. Mild left foraminal stenosis at L4-L5. No significant canal stenosis. 2. Multilevel facet arthropathy, severe on the right at L4-L5 with mild perifacet edema. 3. Moderate Levocurvature centered at L2-L3 with multilevel degenerative disease.  Electronically Signed   By: Feliberto Harts M.D.   On: 09/25/2021 16:20     Objective:  VS:  HT:    WT:   BMI:     BP:123/85  HR:77bpm  TEMP: ( )  RESP:  Physical Exam Vitals and nursing note reviewed.  Constitutional:      General: She is not in acute distress.    Appearance: Normal appearance. She is not ill-appearing.  HENT:     Head: Normocephalic and  atraumatic.     Right Ear: External ear normal.     Left Ear: External ear normal.  Eyes:     Extraocular Movements: Extraocular movements intact.  Cardiovascular:     Rate and Rhythm: Normal rate.     Pulses: Normal pulses.  Pulmonary:     Effort: Pulmonary effort is normal. No respiratory distress.  Abdominal:     General: There is no distension.     Palpations: Abdomen is soft.  Musculoskeletal:        General: Tenderness present.     Cervical back: Neck supple.     Right lower leg: No edema.     Left lower leg: No edema.     Comments: Patient has good distal strength with no pain over the greater trochanters.  No clonus or focal weakness. Patient somewhat slow to rise from a seated position to full extension.  There is concordant low back pain with facet loading and lumbar spine extension rotation.  There are definitive trigger points but the patient is somewhat tender across the lower back and PSIS.  There is no pain with hip rotation.  Skin:    Findings: No erythema, lesion or rash.  Neurological:     General: No focal deficit present.     Mental Status: She is alert and oriented to person, place, and time.     Sensory: No sensory deficit.     Motor: No weakness or abnormal muscle tone.     Coordination: Coordination normal.  Psychiatric:        Mood and Affect: Mood normal.        Behavior: Behavior normal.     Imaging: No results found.

## 2022-02-17 ENCOUNTER — Other Ambulatory Visit: Payer: Self-pay | Admitting: Physical Medicine and Rehabilitation

## 2022-02-17 MED ORDER — OXYCODONE-ACETAMINOPHEN 5-325 MG PO TABS: 1.0000 | ORAL_TABLET | Freq: Two times a day (BID) | ORAL | 0 refills | Status: DC | PRN

## 2022-02-20 ENCOUNTER — Other Ambulatory Visit: Payer: Self-pay

## 2022-02-20 ENCOUNTER — Ambulatory Visit (INDEPENDENT_AMBULATORY_CARE_PROVIDER_SITE_OTHER): Payer: Medicare HMO

## 2022-02-20 DIAGNOSIS — Z0000 Encounter for general adult medical examination without abnormal findings: Secondary | ICD-10-CM

## 2022-02-20 NOTE — Progress Notes (Addendum)
Virtual Visit via Telephone Note ? ?I connected with  Lindsay Reyes on 02/20/22 at  2:30 PM EDT by telephone and verified that I am speaking with the correct person using two identifiers. ? ?Medicare Annual Wellness visit completed telephonically due to Covid-19 pandemic.  ? ?Persons participating in this call: This Health Coach and this patient.  ? ?Location: ?Patient: Home ?Provider: Office  ?  ?I discussed the limitations, risks, security and privacy concerns of performing an evaluation and management service by telephone and the availability of in person appointments. The patient expressed understanding and agreed to proceed. ? ?Unable to perform video visit due to video visit attempted and failed and/or patient does not have video capability.  ? ?Some vital signs may be absent or patient reported.  ? ?Lindsay Brace, LPN ? ? ?Subjective:  ? Lindsay Reyes is a 71 y.o. female who presents for Medicare Annual (Subsequent) preventive examination. ? ?Review of Systems    ? ?Cardiac Risk Factors include: advanced age (>51mn, >>45women);dyslipidemia ? ?   ?Objective:  ?  ?There were no vitals filed for this visit. ?There is no height or weight on file to calculate BMI. ? ?Advanced Directives 02/20/2022 02/13/2021 11/26/2020 01/15/2020 05/03/2017 04/16/2017 09/10/2013  ?Does Patient Have a Medical Advance Directive? Yes No No No No No Patient does not have advance directive;Patient would like information  ?[place] of [place] ?Does patient want to make changes to medical advance directive? - No - Patient declined - - - - -  ?[place]? Yes - validated most recent copy scanned in chart (See row information) - - - - - -  ?Would patient like information on creating a medical advance directive? - - - No - Patient declined - - Advance directive packet given  ? ? ?Current Medications (verified) ?Outpatient Encounter  Medications as of 02/20/2022  ?Medication Sig  ? Multiple Vitamin (MULTIVITAMIN) tablet Take 1 tablet by mouth daily.  ? pantoprazole (PROTONIX) 40 MG tablet TAKE 1 TABLET (40 MG TOTAL) BY MOUTH 2 (TWO) TIMES DAILY BEFORE A MEAL.  ? QUEtiapine (SEROQUEL) 25 MG tablet TAKE 2 TABLETS BY MOUTH AT BEDTIME.  ? zolpidem (AMBIEN) 10 MG tablet Take 10 mg by mouth at bedtime as needed for sleep.  ? [DISCONTINUED] azithromycin (ZITHROMAX) 250 MG tablet Take 2 tabs day 1, then 1 tab daily  ? [DISCONTINUED] cyclobenzaprine (FLEXERIL) 10 MG tablet Take 1 tablet (10 mg total) by mouth 3 (three) times daily as needed for muscle spasms.  ? [DISCONTINUED] DULoxetine (CYMBALTA) 30 MG capsule Take 1 capsule (30 mg total) once a day by mouth for 2 weeks, then take 1 capsule (30 mg) twice a day.  ? [DISCONTINUED] oxyCODONE-acetaminophen (PERCOCET) 5-325 MG tablet Take 1 tablet by mouth every 12 (twelve) hours as needed for severe pain or moderate pain.  ? [DISCONTINUED] zolpidem (AMBIEN) 10 MG tablet TAKE 1 TABLET BY MOUTH EVERY DAY AT BEDTIME AS NEEDED FOR SLEEP  ? ?No facility-administered encounter medications on file as of 02/20/2022.  ? ? ?Allergies (verified) ?Patient has no known allergies.  ? ?History: ?Past Medical History:  ?Diagnosis Date  ? Arthritis   ? Colon polyp   ? Depression   ? Diverticulosis   ? GERD (gastroesophageal reflux disease)   ? Hyperlipidemia   ? Insomnia   ? Retinal micro-aneurysm of right eye   ? Scoliosis   ? ?  Past Surgical History:  ?Procedure Laterality Date  ? ABDOMINAL HYSTERECTOMY  2002  ? COLONOSCOPY    ? ?Family History  ?Problem Relation Age of Onset  ? Hypertension Mother   ? Diabetes Mother   ? Osteoporosis Mother   ? Heart Problems Mother   ? Thyroid cancer Sister   ? Colon cancer Neg Hx   ? ?Social History  ? ?Socioeconomic History  ? Marital status: Single  ?  Spouse name: Not on file  ? Number of children: Not on file  ? Years of education: Not on file  ? Highest education level: Not on file   ?Occupational History  ? Not on file  ?Tobacco Use  ? Smoking status: Never  ? Smokeless tobacco: Never  ?Vaping Use  ? Vaping Use: Never used  ?Substance and Sexual Activity  ? Alcohol use: Not Currently  ?  Comment: socially  ? Drug use: No  ? Sexual activity: Not on file  ?Other Topics Concern  ? Not on file  ?Social History Narrative  ? Not on file  ? ?Social Determinants of Health  ? ?Financial Resource Strain: Low Risk   ? Difficulty of Paying Living Expenses: Not hard at all  ?Food Insecurity: No Food Insecurity  ? Worried About Charity fundraiser in the Last Year: Never true  ? Ran Out of Food in the Last Year: Never true  ?Transportation Needs: No Transportation Needs  ? Lack of Transportation (Medical): No  ? Lack of Transportation (Non-Medical): No  ?Physical Activity: Inactive  ? Days of Exercise per Week: 0 days  ? Minutes of Exercise per Session: 0 min  ?Stress: No Stress Concern Present  ? Feeling of Stress : Not at all  ?Social Connections: Socially Isolated  ? Frequency of Communication with Friends and Family: Once a week  ? Frequency of Social Gatherings with Friends and Family: Once a week  ? Attends Religious Services: Never  ? Active Member of Clubs or Organizations: No  ? Attends Archivist Meetings: Never  ? Marital Status: Never married  ? ? ?Tobacco Counseling ?Counseling given: Not Answered ? ? ?Clinical Intake: ? ?Pre-visit preparation completed: Yes ? ?Pain : No/denies pain ? ?  ? ?BMI - recorded: 24.37 ?Nutritional Status: BMI of 19-24  Normal ?Nutritional Risks: None ?Diabetes: No ? ?How often do you need to have someone help you when you read instructions, pamphlets, or other written materials from your doctor or pharmacy?: 1 - Never ? ?Diabetic?No ? ?Interpreter Needed?: No ? ?Information entered by :: Charlott Rakes, LPN ? ? ?Activities of Daily Living ?In your present state of health, do you have any difficulty performing the following activities: 02/20/2022  ?Hearing? N   ?Vision? N  ?Difficulty concentrating or making decisions? N  ?Walking or climbing stairs? N  ?Dressing or bathing? N  ?Doing errands, shopping? N  ?Preparing Food and eating ? N  ?Using the Toilet? N  ?In the past six months, have you accidently leaked urine? N  ?Do you have problems with loss of bowel control? N  ?Managing your Medications? N  ?Managing your Finances? N  ?Housekeeping or managing your Housekeeping? N  ?Some recent data might be hidden  ? ? ?Patient Care Team: ?[name], MD as PCP - General (Family Medicine) ?Ladene Artist, MD as Consulting Physician (Gastroenterology) ?Gerda Diss, DO as Consulting Physician (Sports Medicine) ?Sherryl Barters, OD (Optometry) ? ?Indicate any recent Medical Services you may have  received from other than [facility] providers in the past year (date may be approximate). ? ?   ?Assessment:  ? This is a routine wellness examination for Lindsay Reyes. ? ?Hearing/Vision screen ?Hearing Screening - Comments:: Pt denies  any hearing issues  ?Vision Screening - Comments:: Pt follows up with needed with Dr Daughtery for exams  ? ?Dietary issues and exercise activities discussed: ?Current Exercise Habits: The patient does not participate in regular exercise at present ? ? Goals Addressed   ? ?  ?  ?  ?  ? This Visit's Progress  ?  Patient Stated     ?  None at this time  ?  ? ?  ? ?Depression Screen ?PHQ 2/9 Scores 02/20/2022 11/26/2021 02/13/2021 01/15/2020 03/01/2018  ?PHQ - 2 Score 0 0 0 - 6  ?PHQ- 9 Score - - - - 25  ?Exception Documentation - - - Patient refusal -  ?  ?Fall Risk ?Fall Risk  02/20/2022 11/26/2021 02/13/2021 01/15/2020 09/29/2018  ?Falls in the past year? 0 0 0 0 No  ?Number falls in past yr: 0 0 0 0 -  ?Injury with Fall? 0 0 0 0 -  ?Risk Factor Category  - - - - -  ?Risk for fall due to : Impaired vision - Impaired vision - -  ?Follow up Falls prevention discussed - Falls prevention discussed Falls evaluation completed;Education provided;Falls prevention discussed  -  ? ? ?FALL RISK PREVENTION PERTAINING TO THE HOME: ? ?Any stairs in or around the home? No  ?If so, are there any without handrails? No  ?Home free of loose throw rugs in walkways, pet beds, electrica

## 2022-02-20 NOTE — Patient Instructions (Signed)
Ms. Lindsay Reyes , ?Thank you for taking time to come for your Medicare Wellness Visit. I appreciate your ongoing commitment to your health goals. Please review the following plan we discussed and let me know if I can assist you in the future.  ? ?Screening recommendations/referrals: ?Colonoscopy: Postponed 07/15/31 ?Mammogram:  Not longer required  ?Bone Density: Done 04/29/20 repeat every 2 years  ?Recommended yearly ophthalmology/optometry visit for glaucoma screening and checkup ?Recommended yearly dental visit for hygiene and checkup ? ?Vaccinations: ?Influenza vaccine: Discontinued  ?Pneumococcal vaccine: Declined and discussed  ?Tdap vaccine: Completed 05/23/14 repeat every 10 years ?Shingles vaccine: Completed 1/19, 03/25/20   ?Covid-19:Declined  ? ?Advanced directives: Copies in chart ? ?Conditions/risks identified: None at this time  ? ?Next appointment: Follow up in one year for your annual wellness visit  ? ? ?Preventive Care 20 Years and Older, Female ?Preventive care refers to lifestyle choices and visits with your health care provider that can promote health and wellness. ?What does preventive care include? ?A yearly physical exam. This is also called an annual well check. ?Dental exams once or twice a year. ?Routine eye exams. Ask your health care provider how often you should have your eyes checked. ?Personal lifestyle choices, including: ?Daily care of your teeth and gums. ?Regular physical activity. ?Eating a healthy diet. ?Avoiding tobacco and drug use. ?Limiting alcohol use. ?Practicing safe sex. ?Taking low-dose aspirin every day. ?Taking vitamin and mineral supplements as recommended by your health care provider. ?What happens during an annual well check? ?The services and screenings done by your health care provider during your annual well check will depend on your age, overall health, lifestyle risk factors, and family history of disease. ?Counseling  ?Your health care provider may ask you questions  about your: ?Alcohol use. ?Tobacco use. ?Drug use. ?Emotional well-being. ?Home and relationship well-being. ?Sexual activity. ?Eating habits. ?History of falls. ?Memory and ability to understand (cognition). ?Work and work Statistician. ?Reproductive health. ?Screening  ?You may have the following tests or measurements: ?Height, weight, and BMI. ?Blood pressure. ?Lipid and cholesterol levels. These may be checked every 5 years, or more frequently if you are over 60 years old. ?Skin check. ?Lung cancer screening. You may have this screening every year starting at age 17 if you have a 30-pack-year history of smoking and currently smoke or have quit within the past 15 years. ?Fecal occult blood test (FOBT) of the stool. You may have this test every year starting at age 70. ?Flexible sigmoidoscopy or colonoscopy. You may have a sigmoidoscopy every 5 years or a colonoscopy every 10 years starting at age 71. ?Hepatitis C blood test. ?Hepatitis B blood test. ?Sexually transmitted disease (STD) testing. ?Diabetes screening. This is done by checking your blood sugar (glucose) after you have not eaten for a while (fasting). You may have this done every 1-3 years. ?Bone density scan. This is done to screen for osteoporosis. You may have this done starting at age 54. ?Mammogram. This may be done every 1-2 years. Talk to your health care provider about how often you should have regular mammograms. ?Talk with your health care provider about your test results, treatment options, and if necessary, the need for more tests. ?Vaccines  ?Your health care provider may recommend certain vaccines, such as: ?Influenza vaccine. This is recommended every year. ?Tetanus, diphtheria, and acellular pertussis (Tdap, Td) vaccine. You may need a Td booster every 10 years. ?Zoster vaccine. You may need this after age 54. ?Pneumococcal 13-valent conjugate (PCV13) vaccine. One  dose is recommended after age 99. ?Pneumococcal polysaccharide (PPSV23)  vaccine. One dose is recommended after age 15. ?Talk to your health care provider about which screenings and vaccines you need and how often you need them. ?This information is not intended to replace advice given to you by your health care provider. Make sure you discuss any questions you have with your health care provider. ?Document Released: 12/20/2015 Document Revised: 08/12/2016 Document Reviewed: 09/24/2015 ?Elsevier Interactive Patient Education ? 2017 [place]. ? ?Fall Prevention in the Home ?Falls can cause injuries. They can happen to people of all ages. There are many things you can do to make your home safe and to help prevent falls. ?What can I do on the outside of my home? ?Regularly fix the edges of walkways and driveways and fix any cracks. ?Remove anything that might make you trip as you walk through a door, such as a raised step or threshold. ?Trim any bushes or trees on the path to your home. ?Use bright outdoor lighting. ?Clear any walking paths of anything that might make someone trip, such as rocks or tools. ?Regularly check to see if handrails are loose or broken. Make sure that both sides of any steps have handrails. ?Any raised decks and porches should have guardrails on the edges. ?Have any leaves, snow, or ice cleared regularly. ?Use sand or salt on walking paths during winter. ?Clean up any spills in your garage right away. This includes oil or grease spills. ?What can I do in the bathroom? ?Use night lights. ?Install grab bars by the toilet and in the tub and shower. Do not use towel bars as grab bars. ?Use non-skid mats or decals in the tub or shower. ?If you need to sit down in the shower, use a plastic, non-slip stool. ?Keep the floor dry. Clean up any water that spills on the floor as soon as it happens. ?Remove soap buildup in the tub or shower regularly. ?Attach bath mats securely with double-sided non-slip rug tape. ?Do not have throw rugs and other things on the floor that  can make you trip. ?What can I do in the bedroom? ?Use night lights. ?Make sure that you have a light by your bed that is easy to reach. ?Do not use any sheets or blankets that are too big for your bed. They should not hang down onto the floor. ?Have a firm chair that has side arms. You can use this for support while you get dressed. ?Do not have throw rugs and other things on the floor that can make you trip. ?What can I do in the kitchen? ?Clean up any spills right away. ?Avoid walking on wet floors. ?Keep items that you use a lot in easy-to-reach places. ?If you need to reach something above you, use a strong step stool that has a grab bar. ?Keep electrical cords out of the way. ?Do not use floor polish or wax that makes floors slippery. If you must use wax, use non-skid floor wax. ?Do not have throw rugs and other things on the floor that can make you trip. ?What can I do with my stairs? ?Do not leave any items on the stairs. ?Make sure that there are handrails on both sides of the stairs and use them. Fix handrails that are broken or loose. Make sure that handrails are as long as the stairways. ?Check any carpeting to make sure that it is firmly attached to the stairs. Fix any carpet that is loose or worn. ?  Avoid having throw rugs at the top or bottom of the stairs. If you do have throw rugs, attach them to the floor with carpet tape. ?Make sure that you have a light switch at the top of the stairs and the bottom of the stairs. If you do not have them, ask someone to add them for you. ?What else can I do to help prevent falls? ?Wear shoes that: ?Do not have high heels. ?Have rubber bottoms. ?Are comfortable and fit you well. ?Are closed at the toe. Do not wear sandals. ?If you use a stepladder: ?Make sure that it is fully opened. Do not climb a closed stepladder. ?Make sure that both sides of the stepladder are locked into place. ?Ask someone to hold it for you, if possible. ?Clearly mark and make sure that you  can see: ?Any grab bars or handrails. ?First and last steps. ?Where the edge of each step is. ?Use tools that help you move around (mobility aids) if they are needed. These include: ?Canes. ?Walkers. ?[place]

## 2022-02-27 ENCOUNTER — Ambulatory Visit: Payer: Medicare HMO | Admitting: Physical Therapy

## 2022-03-04 ENCOUNTER — Ambulatory Visit: Payer: Medicare HMO | Admitting: Physical Medicine and Rehabilitation

## 2022-03-04 ENCOUNTER — Other Ambulatory Visit: Payer: Self-pay

## 2022-03-04 ENCOUNTER — Ambulatory Visit: Payer: Medicare HMO | Attending: Physical Medicine and Rehabilitation | Admitting: Physical Therapy

## 2022-03-04 DIAGNOSIS — M25552 Pain in left hip: Secondary | ICD-10-CM | POA: Insufficient documentation

## 2022-03-04 DIAGNOSIS — G8929 Other chronic pain: Secondary | ICD-10-CM | POA: Diagnosis not present

## 2022-03-04 DIAGNOSIS — M47816 Spondylosis without myelopathy or radiculopathy, lumbar region: Secondary | ICD-10-CM | POA: Insufficient documentation

## 2022-03-04 DIAGNOSIS — M797 Fibromyalgia: Secondary | ICD-10-CM | POA: Diagnosis not present

## 2022-03-04 DIAGNOSIS — M7918 Myalgia, other site: Secondary | ICD-10-CM | POA: Insufficient documentation

## 2022-03-04 DIAGNOSIS — M545 Low back pain, unspecified: Secondary | ICD-10-CM | POA: Diagnosis not present

## 2022-03-04 NOTE — Therapy (Signed)
[facility] [address] ?[place], [place], [address] ?Phone: [phone]   Fax:  [phone] ? ?Physical Therapy Evaluation ? ?Patient Details  ?Name: Lindsay Reyes ?MRN: [id number] ?Date of Birth: [birth year] ?Referring Provider (PT): Barnet Pall, NP ? ? ?Encounter Date: 03/04/2022 ? ? PT End of Session - 03/04/22 1203   ? ? Visit Number 1   ? Number of Visits 6   ? Date for PT Re-Evaluation 04/15/22   ? Authorization Type [employer]   ? Authorization Time Period 03/04/22 to 04/15/22   ? Authorization - Visit Number 1   ? Authorization - Number of Visits 6   ? PT Start Time 1100   ? PT Stop Time 1155   ? PT Time Calculation (min) 55 min   ? Activity Tolerance Patient tolerated treatment well;No increased pain   ? Behavior During Therapy [facility] for tasks assessed/performed   ? ?  ?  ? ?  ? ? ?Past Medical History:  ?Diagnosis Date  ? Arthritis   ? Colon polyp   ? Depression   ? Diverticulosis   ? GERD (gastroesophageal reflux disease)   ? Hyperlipidemia   ? Insomnia   ? Retinal micro-aneurysm of right eye   ? Scoliosis   ? ? ?Past Surgical History:  ?Procedure Laterality Date  ? ABDOMINAL HYSTERECTOMY  2002  ? COLONOSCOPY    ? ? ?There were no vitals filed for this visit. ? ? ? Subjective Assessment - 03/04/22 1105   ? ? Subjective Pt has had low back and Lt SI joint pain for a while. She did not have improvements with PT towards the end of last year and had a nerve ablasion in March of this year but she feels this aggravated everything on the Lt side. She currently is limited with her walking, reaching for something over the counter or being on her feet for too long.   ? Pertinent History nerve ablasion March 2023   ? Limitations Standing;Walking   ? How long can you stand comfortably? reports being able to vacuum etc.   ? How long can you walk comfortably? only able to walk 50% of her current walking distance   ? Patient Stated Goals improve pain and be able to  walk longer   ? Currently in Pain? Yes   ? Pain Score --   not while sitting currently, but can be bad  ? Pain Location Buttocks   ? Pain Orientation Left;Posterior;Proximal;Lateral   ? Pain Descriptors / Indicators Aching;Dull   ? Pain Type Chronic pain   ? Pain Onset More than a month ago   ? Pain Frequency Intermittent   ? Aggravating Factors  prolonged standing/walking; laying on her back for too long; laying on her Lt side   ? Pain Relieving Factors sitting or supine position   ? Effect of Pain on Daily Activities limited recreation   ? ?  ?  ? ?  ? ? ? ? ? [facility] PT Assessment - 03/04/22 0001   ? ?  ? Assessment  ? 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 Referring Provider (PT) Barnet Pall, NP   ? Onset Date/Surgical Date --   greater than 6 months ago  ? Next MD Visit March 12 2022   ? Prior Therapy end of 2022-not much help   ?  ? Precautions  ? Precautions None   ?  ? Restrictions  ? Weight Bearing Restrictions No   ?  ?  Balance Screen  ? Has the patient fallen in the past 6 months No   ? Has the patient had a decrease in activity level because of a fear of falling?  No   ? Is the patient reluctant to leave their home because of a fear of falling?  No   ?  ? Home Environment  ? Living Environment Private residence   ?  ? Prior Function  ? Level of Independence Independent   ?  ? Cognition  ? Overall Cognitive Status Within Functional Limits for tasks assessed   ?  ? Observation/Other Assessments  ? Focus on Therapeutic Outcomes (FOTO)  48%   ?  ? Sensation  ? Additional Comments pt denies numbness/tingling   ?  ? ROM / Strength  ? AROM / PROM / Strength Strength;PROM   ?  ? AROM  ? Overall AROM Comments active lumbar ROM within normal limited except painful lumbar extension and Rt rotation   ?  ? PROM  ? Overall PROM Comments All hip ROM WNL except Rt hip IR limited to 20 deg passive/active   ?  ? Strength  ? Strength Assessment Site Knee;Hip   ? Right/Left Hip Right;Left   ? Right Hip Flexion  5/5   ? Right Hip Extension 4/5   ? Right Hip External Rotation  3/5   ? Right Hip Internal Rotation 3/5   ? Right Hip ABduction 4/5   ? Left Hip Flexion 5/5   ? Left Hip Extension 4/5   ? Left Hip External Rotation 3/5   ? Left Hip Internal Rotation 3/5   ? Left Hip ABduction 3/5   ? Right/Left Knee Right;Left   ? Right Knee Flexion 5/5   ? Right Knee Extension 5/5   ? Left Knee Flexion 5/5   ? Left Knee Extension 5/5   ?  ? Flexibility  ? Soft Tissue Assessment /Muscle Length yes   ? Hamstrings WNL   ? Quadriceps WNL   ?  ? Palpation  ? Palpation comment tenderness and palpable trigger points noted Lt proximal glute max, glute med   ?  ? Ambulation/Gait  ? Gait Comments decreased step length on Rt, landing hard on the Lt LE   ? ?  ?  ? ?  ? ? ? ? ? ? ? ? ? ? ? ? ? ?Objective measurements completed on examination: See above findings.  ? ? ? ? ? [facility] Adult PT Treatment/Exercise - 03/04/22 0001   ? ?  ? Exercises  ? Exercises Knee/Hip   ?  ? Knee/Hip Exercises: Standing  ? Hip Abduction Stengthening;Both;1 set;5 reps   ? Abduction Limitations HEP demo yellow TB around feet   ?  ? Knee/Hip Exercises: Supine  ? Single Leg Bridge Both;Strengthening   HEP demo  ? Other Supine Knee/Hip Exercises hip 90/90 active IR HEP demo   ? ?  ?  ? ?  ? ? ? ? ? ? ? ? ? ? PT Education - 03/04/22 1202   ? ? Education Details eval findings/POC; implemented and reviewed HEP; dry needling benefits   ? Person(s) Educated Patient   ? Methods Explanation;Handout   ? Comprehension Verbalized understanding;Returned demonstration   ? ?  ?  ? ?  ? ? ? ? ? ? PT Long Term Goals - 03/04/22 1220   ? ?  ? PT LONG TERM GOAL #1  ? Title Pt will be independent with advanced HEP and will return  to independent gym program.   ? Time 5   ? Period Weeks   ? Status New   ?  ? PT LONG TERM GOAL #2  ? Title Pt will have atleast 4/5 MMT strength of the Lt glutes to increase her efficiency with walking and other ADLs.   ? Time 5   ? Period Weeks   ? Status New   ?   ? PT LONG TERM GOAL #3  ? Title Pt will be able to walk up to 15 minutes without the need for stopping secondary to pain.   ? Time 5   ? Period Weeks   ? Status New   ?  ? PT LONG TERM GOAL #4  ? Title Pt's FOTO will improve to greater than 55 to reflect increase in pt's quality of life and activity participation.   ? Time 5   ? Period Weeks   ? Status New   ?  ? PT LONG TERM GOAL #5  ? Title Pt will have greater than 40 deg active and passive Rt hip IR which will improve her body mechanics.   ? Time 5   ? Period Weeks   ? Status New   ? ?  ?  ? ?  ? ? ? ? ? ? ? ? ? Plan - 03/04/22 1212   ? ? Clinical Impression Statement Pt is a 71 y.o F referred to OPPT with low back and Lt buttock pain which is chronic in nature. She has had OPPT in the past with little improvement and had an exacerbation of her Lt buttock pain following a nerve ablasion earlier this month. Pt has been working on some of her stretches at home. She has good hip flexibility aside from Rt hip internal rotation, but has significant Lt gluteal weakness noted with MMT. Pt has palpable trigger points in the gluteals on the Lt and was highly sensitive to palpation of this during today's evaluation. Pt is currently limited in her ability to participate in recreation and some household activities secondary to pain and weakness. PT discussed benefits of dry needling treatment, but she feels hesitant to try this at the moment. She would benefit from skilled PT to address current limitations in flexibility, strength and educate on proper body mechanics with daily activity in order to improve her quality of life.   ? Personal Factors and Comorbidities Age;Comorbidity 2;Past/Current Experience;Fitness;Time since onset of injury/illness/exacerbation   ? Comorbidities arthritis, osteopenia   ? Examination-Activity Limitations Lift;Locomotion Level;Other   ? Examination-Participation Restrictions Yard Work;Community Activity   ? Stability/Clinical Decision Making  Evolving/Moderate complexity   ? Clinical Decision Making Moderate   ? Rehab Potential Fair   ? PT Frequency 1x / week   pt only able to afford 1x/week for 5 weeks  ? PT Duration --   5  ? PT Treatme

## 2022-03-04 NOTE — Patient Instructions (Signed)
Access Code: [email or web address] ?URL: [email or web address] ?Date: 03/04/2022 ?Prepared by: [facility] ? ?Exercises ?- Supine Hip Internal Rotation  - 1 x daily - 7 x weekly - 2 sets - 10 reps ?- Figure 4 Bridge  - 1 x daily - 7 x weekly - 3 sets - 5 reps ?- Standing Hip Abduction with Resistance at Ankles and Counter Support  - 1 x daily - 7 x weekly - 2 sets - 10 reps ?

## 2022-03-06 ENCOUNTER — Other Ambulatory Visit: Payer: Self-pay | Admitting: Family Medicine

## 2022-03-06 NOTE — Telephone Encounter (Signed)
Last Visit: 11/26/21 ? ?Next visit: n/a ? ?Last filled: 11/27/21 ? ?Quantity: 180 tablets 0 refills  ?

## 2022-03-10 ENCOUNTER — Ambulatory Visit: Payer: Medicare HMO | Attending: Physical Medicine and Rehabilitation | Admitting: Physical Therapy

## 2022-03-10 DIAGNOSIS — M25552 Pain in left hip: Secondary | ICD-10-CM | POA: Insufficient documentation

## 2022-03-10 DIAGNOSIS — R252 Cramp and spasm: Secondary | ICD-10-CM | POA: Insufficient documentation

## 2022-03-10 DIAGNOSIS — M545 Low back pain, unspecified: Secondary | ICD-10-CM | POA: Insufficient documentation

## 2022-03-10 DIAGNOSIS — M25551 Pain in right hip: Secondary | ICD-10-CM | POA: Insufficient documentation

## 2022-03-10 DIAGNOSIS — G8929 Other chronic pain: Secondary | ICD-10-CM | POA: Insufficient documentation

## 2022-03-12 ENCOUNTER — Encounter: Payer: Self-pay | Admitting: Physical Medicine and Rehabilitation

## 2022-03-12 ENCOUNTER — Ambulatory Visit: Payer: Medicare HMO | Admitting: Physical Medicine and Rehabilitation

## 2022-03-12 DIAGNOSIS — G8929 Other chronic pain: Secondary | ICD-10-CM

## 2022-03-12 DIAGNOSIS — M797 Fibromyalgia: Secondary | ICD-10-CM | POA: Diagnosis not present

## 2022-03-12 DIAGNOSIS — M7918 Myalgia, other site: Secondary | ICD-10-CM | POA: Diagnosis not present

## 2022-03-12 DIAGNOSIS — M47816 Spondylosis without myelopathy or radiculopathy, lumbar region: Secondary | ICD-10-CM

## 2022-03-12 DIAGNOSIS — M545 Low back pain, unspecified: Secondary | ICD-10-CM | POA: Diagnosis not present

## 2022-03-12 NOTE — Progress Notes (Signed)
Pt has hx of inj 02/04/22 pt state at the beginning the pain was worse and then once she was able to get her meds it has helped a lot. ?Pt state her pain at a zero. ?. ?

## 2022-03-12 NOTE — Progress Notes (Signed)
? ?Lindsay Reyes - 71 y.o. female MRN [id number]  Date of birth: 1951-08-16 ? ?Office Visit Note: ?Visit Date: 03/12/2022 ?PCP: [name], MD ?Referred by: [name], MD ? ?Subjective: ?Chief Complaint  ?Patient presents with  ? Lower Back - Pain  ? ?HPI: Lindsay Reyes is a 71 y.o. female who comes in today for evaluation of chronic bilateral lower back pain radiating to buttocks, also reports diffuse pain that radiates up her back and around to hips. Patient states pain worsened after radiofrequency ablation on 02/04/2022. Patient reports pain is exacerbated by movement and activity, describes as tender, cramping and sore sensation, currently rates as 5 out of 10. Patient reports some relief of pain with home exercise regimen, rest and use of medications. Specifically, patient states good relief of pain with use of Ibuprofen as needed. Patients lumbar MRI from 2022 exhibits moderate levocurvature centered at L2-L3, multi-level facet arthropathy most severe on the right at L4-L5. No high grade spinal canal stenosis noted. Patient was recently started on Cymbalta and has titrated up to 60 mg daily. She reports significant relief of pain with this medication, reports more energy and increased functionality. Patient states she is now able to complete daily tasks without severe pain. Patient recently had initial formal physical therapy consult and returns for first session on 03/16/2022, patient states she has been performing PT exercises at home without difficulty. Overall, patient continues to struggle with myofascial pain, however she reports significant improvement in pain and functional ability. Patient denies focal weakness, numbness and tingling. Patient denies recent trauma or falls.  ? ?Review of Systems  ?Musculoskeletal:  Positive for back pain and myalgias.  ?Neurological:  Negative for tingling, sensory change, focal weakness and weakness.  ?All other systems reviewed and are negative. Otherwise per  HPI. ? ?Assessment & Plan: ?Visit Diagnoses:  ?  ICD-10-CM   ?1. Spondylosis without myelopathy or radiculopathy, lumbar region  M47.816   ?  ?2. Chronic bilateral low back pain without sciatica  M54.50   ? G89.29   ?  ?3. Facet hypertrophy of lumbar region  M47.816   ?  ?4. Myofascial pain syndrome  M79.18   ?  ?5. Fibromyalgia  M79.7   ?  ?   ?Plan: Findings:  ?Chronic bilateral lower back pain radiating to buttocks, also reports diffuse pain that radiates up her back and around to hips.  Significant and sustained relief of pain since starting Cymbalta.  Patient is now able to better manage pain at home, has increased functional abilities and more energy to accomplish daily tasks. 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 We believe fibromyalgia could be working to exacerbate her symptoms.  We believe the next step is to continue to monitor patient, she is encouraged to follow-up with [place] as needed.  Patient encouraged to continue with home exercise regimen and physical therapy as tolerated. Patient instructed to take medications as directed.  No red flag symptoms noted upon exam today.  ? ?Meds & Orders: No orders of the defined types were placed in this encounter. ? No orders of the defined types were placed in this encounter. ?  ?Follow-up: Return if symptoms worsen or fail to improve.  ? ?Procedures: ?No procedures performed  ?   ? ?Clinical History: ?EXAM: ?MRI LUMBAR SPINE WITHOUT CONTRAST ?  ?TECHNIQUE: ?Multiplanar, multisequence MR imaging of the lumbar spine was ?performed. No intravenous contrast was administered. ?  ?  COMPARISON:  Lumbar radiographs Apr 10, 2021. ?  ?FINDINGS: ?Segmentation: 5 non rib-bearing lumbar vertebral bodies on prior ?radiographs. ?  ?Alignment:  Moderate Levocurvature centered at L2-L3. ?  ?Vertebrae: Scattered T1 hyperintense lesions throughout the ?thoracolumbar spine and sacrum,  compatible with benign vertebral ?venous malformations. Schmorl's node involving the inferior T12 ?endplate. Degenerative/discogenic endplate signal changes about the ?L2-L3 and L5-S1 discs. No specific evidence of acute fracture or ?discitis/osteomyelitis. ?  ?Conus medullaris and cauda equina: Conus extends to the L1 level. ?Conus appears normal. ?  ?Paraspinal and other soft tissues: Bilateral renal cysts. Paraspinal ?muscular atrophy. Mild right L4-L5 inflammatory perifacet edema. ?  ?Disc levels: ?  ?T12-L1: No significant disc protrusion, foraminal stenosis, or canal ?stenosis. ?  ?L1-L2: Slight disc bulging without significant canal or foraminal ?stenosis. ?  ?L2-L3: Mild right a centric disc bulge and mild right facet ?arthropathy without significant canal or foraminal stenosis. ?  ?L3-L4: Disc height loss, eccentric to the right. Small left ?foraminal disc protrusion. Severe right and mild left facet ?arthropathy without significant canal or foraminal stenosis. ?  ?L4-L5: Moderate bilateral facet arthropathy. Mild disc bulging. ?Resulting mild left foraminal stenosis without significant canal or ?right foraminal stenosis. ?  ?L5-S1: Disc height loss. Mild bilateral facet arthropathy without ?significant canal or foraminal stenosis. ?  ?IMPRESSION: ?1. Mild left foraminal stenosis at L4-L5. No significant canal ?stenosis. ?2. Multilevel facet arthropathy, severe on the right at L4-L5 with ?mild perifacet edema. ?3. Moderate Levocurvature centered at L2-L3 with multilevel ?degenerative disease. ?  ?  ?Electronically Signed ?  By: Margaretha Sheffield M.D. ?  On: 09/25/2021 16:20  ? ?She reports that she has never smoked. She has never used smokeless tobacco. No results for input(s): HGBA1C, LABURIC in the last 8760 hours. ? ?Objective:  VS:  HT:    WT:   BMI:     BP:   HR: bpm  TEMP: ( )  RESP:  ?Physical Exam ?Vitals and nursing note reviewed.  ?HENT:  ?   Head: Normocephalic and atraumatic.  ?   Right  Ear: External ear normal.  ?   Left Ear: External ear normal.  ?   Nose: Nose normal.  ?   Mouth/Throat:  ?   Mouth: Mucous membranes are moist.  ?Eyes:  ?   Pupils: Pupils are equal, round, and reactive to light.  ?Cardiovascular:  ?   Rate and Rhythm: Normal rate.  ?   Pulses: Normal pulses.  ?Pulmonary:  ?   Effort: Pulmonary effort is normal.  ?Abdominal:  ?   General: Abdomen is flat. There is no distension.  ?Musculoskeletal:     ?   General: Tenderness present.  ?   Cervical back: Normal range of motion.  ?   Comments: Pt is slow to rise from seated position to standing. Concordant low back pain with facet loading, lumbar spine extension and rotation. Strong distal strength without clonus. Sensation intact bilaterally.Tenderness noted upon palpation of bilateral gluteal maximus and greater trochanter region. Walks independently, gait steady.   ?Skin: ?   General: Skin is warm and dry.  ?   Capillary Refill: Capillary refill takes less than 2 seconds.  ?Neurological:  ?   General: No focal deficit present.  ?   Mental Status: She is alert and oriented to person, place, and time.  ?Psychiatric:     ?   Mood and Affect: Mood normal.     ?   Behavior: Behavior normal.  ?  ?  Ortho Exam ? ?Imaging: ?No results found. ? ?Past Medical/Family/Surgical/Social History: ?Medications & Allergies reviewed per EMR, new medications updated. ?Patient Active Problem List  ? Diagnosis Date Noted  ? Other idiopathic scoliosis, lumbar region 09/08/2021  ? Pruritus 02/28/2021  ? Movement disorder 08/26/2020  ? Stress 06/11/2020  ? Dyslipidemia 04/30/2020  ? Hyperglycemia 04/30/2020  ? 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  ? Pelvic pain 08/10/2019  ? Rhinorrhea 02/16/2019  ? Sleep-disordered breathing 02/16/2019  ? 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  ? Osteopenia 05/03/2018  ? Allergic rhinitis 04/01/2018  ? Insomnia 03/01/2018  ? 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  ? Urinary incontinence 01/18/2018  ? LOW BACK PAIN, CHRONIC 12/12/2007   ? DIVERTICULOSIS, COLON W/O HEM 08/25/2007  ? GERD 04/15/2007  ? ?Past Medical History:  ?Diagnosis Date  ? Arthritis   ? Colon polyp   ? Depression   ? Diverticulosis   ? GERD (gastroesophageal reflux dis

## 2022-03-16 ENCOUNTER — Ambulatory Visit: Payer: Medicare HMO | Admitting: Physical Therapy

## 2022-03-16 ENCOUNTER — Encounter: Payer: Self-pay | Admitting: Physical Therapy

## 2022-03-16 DIAGNOSIS — M25552 Pain in left hip: Secondary | ICD-10-CM

## 2022-03-16 DIAGNOSIS — M25551 Pain in right hip: Secondary | ICD-10-CM

## 2022-03-16 DIAGNOSIS — R252 Cramp and spasm: Secondary | ICD-10-CM | POA: Diagnosis not present

## 2022-03-16 DIAGNOSIS — M545 Low back pain, unspecified: Secondary | ICD-10-CM | POA: Diagnosis not present

## 2022-03-16 DIAGNOSIS — G8929 Other chronic pain: Secondary | ICD-10-CM | POA: Diagnosis not present

## 2022-03-16 NOTE — Patient Instructions (Signed)
Access Code: [email or web address] ?URL: [email or web address] ?Date: 03/16/2022 ?Prepared by: Lum Babe ? ?Exercises ?- Hooklying Single Knee to Chest Stretch  - 1 x daily - 7 x weekly - 1 sets - 10 reps - 30 hold ?- Supine Double Knee to Chest  - 1 x daily - 7 x weekly - 1 sets - 10 reps - 30 hold ?- Supine Lower Trunk Rotation  - 1 x daily - 7 x weekly - 1 sets - 10 reps - 10 hold ?- Abdominal Bracing  - 1 x daily - 7 x weekly - 1 sets - 10 reps - 10 hold ?

## 2022-03-16 NOTE — Therapy (Signed)
[facility] ?[place], [place], [address] ?Phone: [phone]   Fax:  [phone] ? ?Physical Therapy Treatment ? ?Patient Details  ?Name: Lindsay Reyes ?MRN: [id number] ?Date of Birth: 01/14/1951 ?Referring Provider (PT): Barnet Pall, NP ? ? ?Encounter Date: 03/16/2022 ? ? PT End of Session - 03/16/22 1403   ? ? Visit Number 2   ? Number of Visits 6   ? Date for PT Re-Evaluation 04/15/22   ? Authorization Type [employer]   ? PT Start Time 1314   ? PT Stop Time 1400   ? PT Time Calculation (min) 46 min   ? Activity Tolerance Patient tolerated treatment well   ? Behavior During Therapy [facility] for tasks assessed/performed   ? ?  ?  ? ?  ? ? ?Past Medical History:  ?Diagnosis Date  ? Arthritis   ? Colon polyp   ? Depression   ? Diverticulosis   ? GERD (gastroesophageal reflux disease)   ? Hyperlipidemia   ? Insomnia   ? Retinal micro-aneurysm of right eye   ? Scoliosis   ? ? ?Past Surgical History:  ?Procedure Laterality Date  ? ABDOMINAL HYSTERECTOMY  2002  ? COLONOSCOPY    ? ? ?There were no vitals filed for this visit. ? ? Subjective Assessment - 03/16/22 1319   ? ? Subjective Saw MD, wants me to continue PT, I tried to walk but only did 10 minutes and I knew the pain was coming.  Overall I am better than last year, very tender in the left buttock and leg   ? Currently in Pain? Yes   ? Pain Score 0-No pain   ? Aggravating Factors  walking   ? ?  ?  ? ?  ? ? ? ? ? ? ? ? ? ? ? ? ? ? ? ? ? ? ? ? [place] [facility] PT Treatment/Exercise - 03/16/22 0001   ? ?  ? Ambulation/Gait  ? Gait Comments gait around the back building, started having some left gluteal pain and some back pain less than 4 minutes   ?  ? Self-Care  ? Self-Care Other Self-Care Comments   ? Other Self-Care Comments  use of tennis ball for self massage, how to do self traction   ?  ? Therapeutic Activites   ? Therapeutic Activities ADL's;Lifting   ? ADL's dishes, making bed, vaccuuming   ?  Lifting weight close, pivot, golfers lift   ?  ? Knee/Hip Exercises: Stretches  ? Piriformis Stretch Both;2 reps;20 seconds   ?  ? Knee/Hip Exercises: Aerobic  ? Nustep level 5 x 5 minutes   ?  ? Knee/Hip Exercises: Supine  ? Other Supine Knee/Hip Exercises feet on ball K2C, trunk rotation, small bridges isometric abs   ? ?  ?  ? ?  ? ? ? ? ? ? ? ? ? ? ? ? PT Short Term Goals - 03/16/22 1412   ? ?  ? PT SHORT TERM GOAL #1  ? Title Independent with initial HEP   ? Status Achieved   ? ?  ?  ? ?  ? ? ? ? PT Long Term Goals - 03/04/22 1220   ? ?  ? PT LONG TERM GOAL #1  ? Title Pt will be independent with advanced HEP and will return to independent gym program.   ? Time 5   ? Period Weeks   ? Status New   ?  ?  PT LONG TERM GOAL #2  ? Title Pt will have atleast 4/5 MMT strength of the Lt glutes to increase her efficiency with walking and other ADLs.   ? Time 5   ? Period Weeks   ? Status New   ?  ? PT LONG TERM GOAL #3  ? Title Pt will be able to walk up to 15 minutes without the need for stopping secondary to pain.   ? Time 5   ? Period Weeks   ? Status New   ?  ? PT LONG TERM GOAL #4  ? Title Pt's FOTO will improve to greater than 55 to reflect increase in pt's quality of life and activity participation.   ? Time 5   ? Period Weeks   ? Status New   ?  ? PT LONG TERM GOAL #5  ? Title Pt will have greater than 40 deg active and passive Rt hip IR which will improve her body mechanics.   ? Time 5   ? Period Weeks   ? Status New   ? ?  ?  ? ?  ? ? ? ? ? ? ? ? Plan - 03/16/22 1403   ? ? Clinical Impression Statement Patient reports some pain relief, doing the exercises, she had some quesitons about home ADL's to avoid pain, we went over that with correct posture and body mechanics, also went over self massage and traction, she was able to demonstrate this with some cues.  She seemed to want to stop PT, she did have pain with walking about 3 minutes.  I gave her many different ideas of how to help and prevent, she was  dismissive of most things.   ? PT Next Visit Plan Patient reports that she may stop coming to PT as she feels she can do on her own,   ? Consulted and Agree with Plan of Care Patient   ? ?  ?  ? ?  ? ? ?Patient will benefit from skilled therapeutic intervention in order to improve the following deficits and impairments:  Decreased endurance, Difficulty walking, Increased muscle spasms, Improper body mechanics, Decreased range of motion, Decreased activity tolerance, Decreased strength, Postural dysfunction, Pain ? ?Visit Diagnosis: ?Chronic left-sided low back pain without sciatica ? ?Pain in left hip ? ?Pain in right hip ? ?Cramp and spasm ? ? ? ? ?Problem List ?Patient Active Problem List  ? Diagnosis Date Noted  ? Other idiopathic scoliosis, lumbar region 09/08/2021  ? Pruritus 02/28/2021  ? Movement disorder 08/26/2020  ? Stress 06/11/2020  ? Dyslipidemia 04/30/2020  ? Hyperglycemia 04/30/2020  ? 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  ? Pelvic pain 08/10/2019  ? Rhinorrhea 02/16/2019  ? Sleep-disordered breathing 02/16/2019  ? 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  ? Osteopenia 05/03/2018  ? Allergic rhinitis 04/01/2018  ? Insomnia 03/01/2018  ? 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  ? Urinary incontinence 01/18/2018  ? LOW BACK PAIN, CHRONIC 12/12/2007  ? DIVERTICULOSIS, COLON W/O HEM 08/25/2007  ? GERD 04/15/2007  ? ? Sumner Boast, PT ?03/16/2022, 2:13 PM ? ?[facility] ?[place], [place], [address] ?Phone: [phone]   Fax:  [phone] ? ?Name: Lindsay Reyes ?MRN: [id number] ?Date of Birth: 11/25/1951 ? ? ? ?

## 2022-03-20 ENCOUNTER — Ambulatory Visit: Payer: Medicare HMO | Admitting: Physical Therapy

## 2022-03-26 ENCOUNTER — Ambulatory Visit: Payer: Medicare HMO | Admitting: Physical Therapy

## 2022-03-31 ENCOUNTER — Ambulatory Visit: Payer: Medicare HMO | Admitting: Physical Therapy

## 2022-04-07 ENCOUNTER — Ambulatory Visit: Payer: Medicare HMO | Admitting: Physical Therapy

## 2022-05-05 ENCOUNTER — Ambulatory Visit (INDEPENDENT_AMBULATORY_CARE_PROVIDER_SITE_OTHER)
Admission: RE | Admit: 2022-05-05 | Discharge: 2022-05-05 | Disposition: A | Discharge status: Home / Self Care | Payer: Medicare HMO | Source: Ambulatory Visit | Attending: Family Medicine | Admitting: Family Medicine

## 2022-05-05 DIAGNOSIS — M8580 Other specified disorders of bone density and structure, unspecified site: Secondary | ICD-10-CM

## 2022-05-12 NOTE — Progress Notes (Signed)
Please inform patient of the following:  Her bone density scan shows that she has stable osteopenia.  She should continue calcium and vitamin D supplementation and we can recheck in 2 years.

## 2022-05-18 ENCOUNTER — Telehealth: Payer: Self-pay | Admitting: Physical Medicine and Rehabilitation

## 2022-05-18 NOTE — Telephone Encounter (Signed)
Patient called. She would like Megan to know that she is doing well and would like  a refill on her medication

## 2022-05-19 ENCOUNTER — Other Ambulatory Visit: Payer: Self-pay | Admitting: Physical Medicine and Rehabilitation

## 2022-05-19 MED ORDER — DULOXETINE HCL 30 MG PO CPEP
30.0000 mg | ORAL_CAPSULE | Freq: Two times a day (BID) | ORAL | 1 refills | Status: DC
Start: 2022-05-19 — End: 2022-09-22

## 2022-06-01 ENCOUNTER — Other Ambulatory Visit: Payer: Self-pay | Admitting: Family Medicine

## 2022-06-04 ENCOUNTER — Ambulatory Visit: Payer: Medicare HMO | Admitting: Family Medicine

## 2022-06-12 ENCOUNTER — Encounter: Payer: Self-pay | Admitting: Family Medicine

## 2022-06-12 ENCOUNTER — Ambulatory Visit (INDEPENDENT_AMBULATORY_CARE_PROVIDER_SITE_OTHER): Payer: Medicare HMO | Admitting: Family Medicine

## 2022-06-12 VITALS — BP 128/80 | HR 71 | Temp 97.5°F | Ht 64.0 in | Wt 144.4 lb

## 2022-06-12 DIAGNOSIS — L299 Pruritus, unspecified: Secondary | ICD-10-CM | POA: Diagnosis not present

## 2022-06-12 DIAGNOSIS — G4700 Insomnia, unspecified: Secondary | ICD-10-CM

## 2022-06-12 DIAGNOSIS — M8580 Other specified disorders of bone density and structure, unspecified site: Secondary | ICD-10-CM

## 2022-06-12 DIAGNOSIS — M533 Sacrococcygeal disorders, not elsewhere classified: Secondary | ICD-10-CM

## 2022-06-12 DIAGNOSIS — M4126 Other idiopathic scoliosis, lumbar region: Secondary | ICD-10-CM | POA: Diagnosis not present

## 2022-06-12 NOTE — Assessment & Plan Note (Signed)
She had some improvement with radiofrequency ablation via pain management.  We will give jury excuse letter today.

## 2022-06-12 NOTE — Assessment & Plan Note (Signed)
Still bothersome.  Recommend over-the-counter Sarna lotion.  She can also try IcyHot or Bengay to see if this helps.  She declined oral medications.  Secondary to xerosis cutis.  Continue topical emollients.

## 2022-06-12 NOTE — Assessment & Plan Note (Signed)
Stable on the current regimen of Ambien and Seroquel.

## 2022-06-12 NOTE — Patient Instructions (Signed)
It was very nice to see you today!  Your bone density scan is stable.  We can recheck again in 2 years.  Please make sure that you are getting plenty of calcium and vitamin D.    You can try using Sarna for your itching.  Take care, Dr [name]  PLEASE NOTE:  If you had any lab tests please let us know if you have not heard back within a few days. You may see your results on mychart before we have a chance to review them but we will give you a call once they are reviewed by [place]. If we ordered any referrals today, please let us know if you have not heard from their office within the next week.   Please try these tips to maintain a healthy lifestyle:  Eat at least 3 REAL meals and 1-2 snacks per day.  Aim for no more than 5 hours between eating.  If you eat breakfast, please do so within one hour of getting up.   Each meal should contain half fruits/vegetables, one quarter protein, and one quarter carbs (no bigger than a computer mouse)  Cut down on sweet beverages. This includes juice, soda, and sweet tea.   Drink at least 1 glass of water with each meal and aim for at least 8 glasses per day  Exercise at least 150 minutes every week.

## 2022-06-12 NOTE — Assessment & Plan Note (Signed)
Symptoms are overall stable.  Still bothersome.  We will give jury excuse letter today.  She can follow-up with her pain medicine doctor as needed.

## 2022-06-12 NOTE — Progress Notes (Signed)
   Lindsay Reyes is a 71 y.o. female who presents today for an office visit.  Assessment/Plan:  Chronic Problems Addressed Today: Pruritus Still bothersome.  Recommend over-the-counter Sarna lotion.  She can also try IcyHot or Bengay to see if this helps.  She declined oral medications.  Secondary to xerosis cutis.  Continue topical emollients.  Osteopenia (last DEXA 2023) Went over her recent bone density scan.  Her T-scores are still in the osteopenic range.  We discussed calcium and vitamin D supplementations.  We can recheck in 2 years.  Coccygodynia Symptoms are overall stable.  Still bothersome.  We will give jury excuse letter today.  She can follow-up with her pain medicine doctor as needed.  Other idiopathic scoliosis, lumbar region She had some improvement with radiofrequency ablation via pain management.  We will give jury excuse letter today.  Insomnia Stable on the current regimen of Ambien and Seroquel.     Subjective:  HPI:  Check A/P for status of chronic conditions.       Objective:  Physical Exam: BP 128/80   Pulse 71   Temp (!) 97.5 F (36.4 C) (Temporal)   Ht '5\' 4"'$  (1.626 m)   Wt 144 lb 6.4 oz (65.5 kg)   SpO2 99%   BMI 24.79 kg/m   Gen: No acute distress, resting comfortably Neuro: Grossly normal, moves all extremities Psych: Normal affect and thought content      Hayven Croy M. [name], MD 06/12/2022 1:52 PM

## 2022-06-12 NOTE — Assessment & Plan Note (Signed)
Went over her recent bone density scan.  Her T-scores are still in the osteopenic range.  We discussed calcium and vitamin D supplementations.  We can recheck in 2 years.

## 2022-06-19 ENCOUNTER — Telehealth: Payer: Self-pay | Admitting: Family Medicine

## 2022-06-19 NOTE — Telephone Encounter (Signed)
Pt states  -forgot to discuss at 07/07 appt.. -previously discussed with PCP -facial movement is constant and getting worse. -not able to control it as well as before -does not hinder eating or sleeping. -previous medication prescribed was $800, too expensive - insurance is the same now as it was then   Pt request medication to control facial movement.   Pt requests telephone visit if PCP team needs to discuss Pt declined virtual visit.   Preferred pharmacy"   [id number] [place] [address] [id number] Phone:  [phone] Fax:  [phone] DEA #:  [id number]

## 2022-06-22 NOTE — Telephone Encounter (Signed)
Patient is scheduled for appointment 06/29/22

## 2022-06-22 NOTE — Telephone Encounter (Signed)
Please schedule appointment with PCP.

## 2022-06-29 ENCOUNTER — Encounter: Payer: Self-pay | Admitting: Family Medicine

## 2022-06-29 ENCOUNTER — Ambulatory Visit (INDEPENDENT_AMBULATORY_CARE_PROVIDER_SITE_OTHER): Payer: Medicare HMO | Admitting: Family Medicine

## 2022-06-29 VITALS — BP 126/77 | HR 74 | Temp 97.7°F | Ht 64.0 in | Wt 145.4 lb

## 2022-06-29 DIAGNOSIS — G4700 Insomnia, unspecified: Secondary | ICD-10-CM | POA: Diagnosis not present

## 2022-06-29 DIAGNOSIS — G259 Extrapyramidal and movement disorder, unspecified: Secondary | ICD-10-CM

## 2022-06-29 MED ORDER — ZOLPIDEM TARTRATE 10 MG PO TABS: 10.0000 mg | ORAL_TABLET | Freq: Every evening | ORAL | 5 refills | Status: DC | PRN

## 2022-06-29 NOTE — Assessment & Plan Note (Signed)
She is currently on Ambien 10 mg nightly and Seroquel 50 mg daily.  We will be weaning down on her Seroquel as above.  She will let us know if this impacts her sleep significantly.

## 2022-06-29 NOTE — Patient Instructions (Addendum)
It was very nice to see you today!  Please try decreasing your quetiapine/Seroquel to see if this helps with your symptoms.  Let me know in a few weeks how you are doing.  We could try another medication called amantadine if your symptoms or not improving.  We may need to send you to neurology if this continues to be an issue.  Take care, Dr [name]  PLEASE NOTE:  If you had any lab tests please let us know if you have not heard back within a few days. You may see your results on mychart before we have a chance to review them but we will give you a call once they are reviewed by [place]. If we ordered any referrals today, please let us know if you have not heard from their office within the next week.   Please try these tips to maintain a healthy lifestyle:  Eat at least 3 REAL meals and 1-2 snacks per day.  Aim for no more than 5 hours between eating.  If you eat breakfast, please do so within one hour of getting up.   Each meal should contain half fruits/vegetables, one quarter protein, and one quarter carbs (no bigger than a computer mouse)  Cut down on sweet beverages. This includes juice, soda, and sweet tea.   Drink at least 1 glass of water with each meal and aim for at least 8 glasses per day  Exercise at least 150 minutes every week.

## 2022-06-29 NOTE — Progress Notes (Signed)
   Lindsay Reyes is a 71 y.o. female who presents today for an office visit.  Assessment/Plan:  Chronic Problems Addressed Today: Movement disorder This is continued issue for her.  Symptoms seem to be worsening and she is worried about reaction that she would get out of public spaces.  We have tried Ingrezza in the past however this was cost prohibitive.  She is interested in further treatment options at this point.  Discussed with patient that her Seroquel could potentially be exacerbating her symptoms.  She will try to wean down over the next couple of weeks to see if this helps with her symptoms.  If this does not work we could consider trial of amantadine to help with medication induced EPS.  We discussed referral to neurology however she will defer for now.  If she has not had any improvement despite above she will need to be referred.  Insomnia She is currently on Ambien 10 mg nightly and Seroquel 50 mg daily.  We will be weaning down on her Seroquel as above.  She will let us know if this impacts her sleep significantly.     Subjective:  HPI:  See A/P for status of chronic conditions.       Objective:  Physical Exam: BP 126/77   Pulse 74   Temp 97.7 F (36.5 C) (Temporal)   Ht '5\' 4"'$  (1.626 m)   Wt 145 lb 6.4 oz (66 kg)   SpO2 98%   BMI 24.96 kg/m   Gen: No acute distress, resting comfortably Neuro: Grossly normal, moves all extremities Psych: Normal affect and thought content      Derrious Bologna M. [name], MD 06/29/2022 11:18 AM

## 2022-06-29 NOTE — Assessment & Plan Note (Signed)
This is continued issue for her.  Symptoms seem to be worsening and she is worried about reaction that she would get out of public spaces.  We have tried Ingrezza in the past however this was cost prohibitive.  She is interested in further treatment options at this point.  Discussed with patient that her Seroquel could potentially be exacerbating her symptoms.  She will try to wean down over the next couple of weeks to see if this helps with her symptoms.  If this does not work we could consider trial of amantadine to help with medication induced EPS.  We discussed referral to neurology however she will defer for now.  If she has not had any improvement despite above she will need to be referred.

## 2022-07-07 ENCOUNTER — Telehealth: Payer: Self-pay | Admitting: Family Medicine

## 2022-07-07 NOTE — Telephone Encounter (Signed)
Patient has not seen a difference as per discussion on 7/25 with dr [name] - patient is ready to try new medication- Please call patient back with new recommendations as per her request.

## 2022-07-08 ENCOUNTER — Other Ambulatory Visit: Payer: Self-pay | Admitting: *Deleted

## 2022-07-08 MED ORDER — AMANTADINE HCL 100 MG PO TABS: 100.0000 mg | ORAL_TABLET | Freq: Two times a day (BID) | ORAL | 0 refills | Status: DC

## 2022-07-08 NOTE — Telephone Encounter (Signed)
Ok to send in amantadine '100mg'$  twice daily. She should follow up with [place] in a few weeks to let us know how this is working.  Algis Greenhouse. [name], MD 07/08/2022 11:04 AM

## 2022-07-08 NOTE — Telephone Encounter (Signed)
Rx send to pharmacy  

## 2022-08-02 ENCOUNTER — Other Ambulatory Visit: Payer: Self-pay | Admitting: Family Medicine

## 2022-08-05 ENCOUNTER — Other Ambulatory Visit: Payer: Self-pay | Admitting: Family Medicine

## 2022-08-06 ENCOUNTER — Other Ambulatory Visit: Payer: Self-pay | Admitting: Family Medicine

## 2022-08-06 NOTE — Telephone Encounter (Signed)
Note form pharmacy Alternative Requested:INSURANCE WANTS AN ALTERNATIVE.

## 2022-08-06 NOTE — Telephone Encounter (Signed)
Do hey have any suggested alternatives?

## 2022-08-12 ENCOUNTER — Telehealth: Payer: Self-pay | Admitting: Family Medicine

## 2022-08-12 NOTE — Telephone Encounter (Signed)
Pt states: -Amantadine was not effective.   Pt requests: -referral to Neurologist

## 2022-08-14 ENCOUNTER — Other Ambulatory Visit: Payer: Self-pay | Admitting: *Deleted

## 2022-08-14 ENCOUNTER — Encounter: Payer: Self-pay | Admitting: Neurology

## 2022-08-14 DIAGNOSIS — G259 Extrapyramidal and movement disorder, unspecified: Secondary | ICD-10-CM

## 2022-08-14 NOTE — Telephone Encounter (Signed)
Referral placed, ok to placed referral per last office note

## 2022-08-28 ENCOUNTER — Other Ambulatory Visit: Payer: Self-pay | Admitting: Family Medicine

## 2022-09-03 DIAGNOSIS — Z1231 Encounter for screening mammogram for malignant neoplasm of breast: Secondary | ICD-10-CM | POA: Diagnosis not present

## 2022-09-03 LAB — HM MAMMOGRAPHY

## 2022-09-07 ENCOUNTER — Encounter: Payer: Self-pay | Admitting: Family Medicine

## 2022-09-09 ENCOUNTER — Telehealth: Payer: Self-pay | Admitting: Family Medicine

## 2022-09-09 NOTE — Telephone Encounter (Signed)
Left message to return call to our office at their convenience.  

## 2022-09-09 NOTE — Telephone Encounter (Signed)
Patient states: - She woke up this morning with severe back pain upon sitting up from bed  - Feels like pain is mainly between ribs on left side of back  - Pain eases up when she is standing - Feels PT exercises she was given could be cause of pain   Patient has been transferred to triage.

## 2022-09-09 NOTE — Telephone Encounter (Signed)
Pt states: -Mammogram performed 09/03/22   Pt requests: -call from PCP team with results.

## 2022-09-09 NOTE — Telephone Encounter (Signed)
Pt scheduled with PCP for 09/14/22 // declined earlier appointment with another provider.  Patient Name: Lindsay Reyes Gender: Female DOB: November 03, 1951 Age: 71 Y 72 M 8 D Return Phone Number: [phone] (Primary) Address: City/ State/ Zip: [place] [facility] [place] [facility] [place] [facility] Provider Dimas Chyle- MD Contact Type Call Who Is Calling Patient / Member / Family / Caregiver Call Type Triage / Clinical Relationship To Patient Self Return Phone Number [phone] (Primary) Chief Complaint CHEST PAIN - pain, pressure, heaviness or tightness Reason for Call Symptomatic / Request for [name] states that she had sudden severe pain in between her ribs and shoulder blade. The pain is so bad she could scream. Once she sits up, the pain goes away. She believes this is due to her PT exercise. The pain is always in the same spot after exercising. It feels like a sharp muscle spasm. Translation No Nurse Assessment Nurse: D'Heur Lucia Gaskins, RN, Adrienne Date/Time (Eastern Time): 09/09/2022 2:37:45 PM Confirm and document reason for call. If symptomatic, describe symptoms. ---Caller states that she had sudden severe pain in between her ribs and shoulder blade in her back; she denies chest pain. The pain is so bad she could scream. Once she sits up, the pain decreases. She believes this is due to her PT exercise. The pain is always in the same spot after exercising. It feels like a sharp muscle spasm. It is on the left side. Pain has been present for "months and months and months". The pain is between 8 and 10/10. Does the patient have any new or worsening symptoms? ---Yes Will a triage be completed? ---Yes Related visit to physician within the last 2 weeks? ---No Does the PT have any chronic conditions? (i.e. diabetes, asthma, this includes High risk factors  for pregnancy, etc.) ---Yes List chronic conditions. ---acid reflux, insomnia, Is this a behavioral health or substance abuse call? ---No  Guidelines Guideline Title Affirmed Question Affirmed Notes Nurse Date/Time Eilene Ghazi Time) Chest Pain [1] Chest pain lasts > 5 minutes AND [2] occurred > 3 days ago (72 hours) AND [3] NO chest pain or cardiac symptoms now [place], [place], Adrienne 09/09/2022 2:43:37 PM Disp. Time Eilene Ghazi Time) Disposition Final User 09/09/2022 2:35:34 PM Send to Urgent [place] 09/09/2022 2:47:52 PM See PCP within 24 Hours Yes D'Heur Lucia Gaskins, RN, [place] Final Disposition 09/09/2022 2:47:52 PM See PCP within 24 Hours Yes D'Heur Lucia Gaskins, RN, [place] Caller Disagree/Comply Comply Caller Understands Yes PreDisposition Call Doctor Care Advice Given Per Guideline SEE PCP WITHIN 24 HOURS: * IF OFFICE WILL BE OPEN: You need to be examined within the next 24 hours. Call your doctor (or NP/PA) when the office opens and make an appointment. CALL BACK IF: * Difficulty breathing or unusual sweating occurs * You become worse CARE ADVICE given per Chest Pain (Adult) guideline. Referrals REFERRED TO PCP OFFICE

## 2022-09-10 NOTE — Telephone Encounter (Signed)
Patient has OV on 09/14/2022

## 2022-09-14 ENCOUNTER — Encounter: Payer: Self-pay | Admitting: Family Medicine

## 2022-09-14 ENCOUNTER — Ambulatory Visit (INDEPENDENT_AMBULATORY_CARE_PROVIDER_SITE_OTHER): Payer: Medicare HMO | Admitting: Family Medicine

## 2022-09-14 VITALS — BP 115/74 | HR 91 | Temp 97.5°F | Ht 64.0 in | Wt 143.6 lb

## 2022-09-14 DIAGNOSIS — G8929 Other chronic pain: Secondary | ICD-10-CM | POA: Diagnosis not present

## 2022-09-14 DIAGNOSIS — G4700 Insomnia, unspecified: Secondary | ICD-10-CM

## 2022-09-14 DIAGNOSIS — Z1211 Encounter for screening for malignant neoplasm of colon: Secondary | ICD-10-CM

## 2022-09-14 DIAGNOSIS — M546 Pain in thoracic spine: Secondary | ICD-10-CM | POA: Diagnosis not present

## 2022-09-14 DIAGNOSIS — M4126 Other idiopathic scoliosis, lumbar region: Secondary | ICD-10-CM

## 2022-09-14 DIAGNOSIS — M25512 Pain in left shoulder: Secondary | ICD-10-CM

## 2022-09-14 NOTE — Assessment & Plan Note (Signed)
Stable on Ambien 10 mg nightly, Seroquel 50 mg daily.

## 2022-09-14 NOTE — Assessment & Plan Note (Signed)
Could be contributing to above.  We will have her follow back up with sports medicine orthopedics if not improving.

## 2022-09-14 NOTE — Patient Instructions (Signed)
It was very nice to see you today!  Please work on the exercises for your back and shoulder.  I will refer you for colonoscopy.  Let me know if not improving in the next few weeks.  Take care, Dr [name]  PLEASE NOTE:  If you had any lab tests please let us know if you have not heard back within a few days. You may see your results on mychart before we have a chance to review them but we will give you a call once they are reviewed by [place]. If we ordered any referrals today, please let us know if you have not heard from their office within the next week.   Please try these tips to maintain a healthy lifestyle:  Eat at least 3 REAL meals and 1-2 snacks per day.  Aim for no more than 5 hours between eating.  If you eat breakfast, please do so within one hour of getting up.   Each meal should contain half fruits/vegetables, one quarter protein, and one quarter carbs (no bigger than a computer mouse)  Cut down on sweet beverages. This includes juice, soda, and sweet tea.   Drink at least 1 glass of water with each meal and aim for at least 8 glasses per day  Exercise at least 150 minutes every week.

## 2022-09-14 NOTE — Progress Notes (Signed)
   Lindsay Reyes is a 71 y.o. female who presents today for an office visit.  Assessment/Plan:  New/Acute Problems: Thoracic Back Pain No red flags.  Consistent with muscular strain.  Possibly rhomboid strain.  We discussed exercises and handout was given.  We also discussed starting anti-inflammatory and muscle relaxer however she declined.  She will let me know if not improving and we can refer to PT or sports medicine  Left shoulder pain Consistent with rotator cuff tendinopathy.  We discussed home exercises and handout was given.  As above we discussed starting an anti-inflammatory however she declined.  Could refer to PT or sports medicine if not improving.  Chronic Problems Addressed Today: Other idiopathic scoliosis, lumbar region Could be contributing to above.  We will have her follow back up with sports medicine orthopedics if not improving.  Insomnia Stable on Ambien 10 mg nightly, Seroquel 50 mg daily.     Subjective:  HPI:  Patient here with persistent thoracic back pain. Located underneath her left scapula. Feels like a pain. She thinks that she may have injuried it while stretching at home. She has tried working one some home exercises which has helped. No weakness or numbness or tingling. No breathing issue.   She has been having some left shoulder pain for the last several months to years. She feels like something is about to break.  Worse with certain motions.  No obvious injuries or precipitating events though thinks that she may have injured it with swimming last year.       Objective:  Physical Exam: BP 115/74   Pulse 91   Temp (!) 97.5 F (36.4 C) (Temporal)   Ht '5\' 4"'$  (1.626 m)   Wt 143 lb 9.6 oz (65.1 kg)   SpO2 98%   BMI 24.65 kg/m   Gen: No acute distress, resting comfortably CV: Regular rate and rhythm with no murmurs appreciated Pulm: Normal work of breathing, clear to auscultation bilaterally with no crackles, wheezes, or rhonchi MSK: - Left  Shoulder: No deformities.  Tenderness palpation along posterior aspect of acromion.  Pain elicited with resisted supraspinatus, internal rotation, and external rotation.  Positive Neer and Hawkins test - Back: No deformities.  Tenderness palpation along left mid thoracic paraspinal muscles. Neuro: Grossly normal, moves all extremities Psych: Normal affect and thought content      Gicela Schwarting M. [name], MD 09/14/2022 1:57 PM

## 2022-09-18 NOTE — Progress Notes (Unsigned)
Assessment/Plan:   Simple motor tics -Reassured her that I saw no evidence of tardive dyskinesia today.  I do not think that this is related to quetiapine. 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  Discussed that treatments can be frustrating for patients as there are no specific treatments for tics.  Discussed that sometimes will use blood pressure medications or antidepressants.  We discussed various medications in general and ultimately decided to cautiously start clonidine.  We discussed that she needs to watch her blood pressure closely as this can decrease blood pressure.  We will start with 0.1 mg daily.  She expressed understanding.  If that does not help, we can always try something like fluoxetine, although it has a slight interaction with the Cymbalta.  She states that she is not sure she needs the Cymbalta any longer as she had what sounds like a rhizotomy that helped her back.  Subjective:   Lindsay Reyes was seen today in neurologic consultation at the request of [name], MD.  The consultation is for the evaluation of tardive dyskinesia.  Numerous records made available to me are reviewed.  The initial diagnosis occurred in September, 2021.  This appeared to be a phone visit.  Notes from September, 2021 states that the offending agent was quetiapine (patient only on 50 mg) and initial symptom was pressure on the bridge of her nose.  Patient was placed on Austedo that visit..  It appears that insurance did not approve this and then she requested to trial Ingrezza instead, which was approved by insurance but it was unaffordable.    Patient was approved for [facility], but I do not see that she ever took that.  She states today that she took it for a month and it didn't help.  She declined a referral to neurology in 2021 (she actually saw [facility] in 2019, but that was for sleep).  Pt states that she has noted that she feels "an urge" to  move the bridge of the nose.  It occurs from the times she wakes up until she goes to bed.  Her family has referred to her as bunny nose.  She states that she can suppress it, but then she will need to start to move it.  It comes with a desire to inhale through the nose as well.  She states that she had vocal tics as a child.  She doesn't recall other tics.  She doesn't know if she had tourettes.  She does state that she has to move and stretch her shoulders sometimes.  She denies any abnormal movements of the eyes.  There is no family history of abnormal movements.   Neuroimaging has not previously been performed.    PREVIOUS MEDICATIONS: Tetrabenazine  ALLERGIES:  No Known Allergies  CURRENT MEDICATIONS:  Outpatient Encounter Medications as of 09/21/2022  Medication Sig   DULoxetine (CYMBALTA) 30 MG capsule Take 1 capsule (30 mg total) by mouth 2 (two) times daily.   Multiple Vitamin (MULTIVITAMIN) tablet Take 1 tablet by mouth daily.   pantoprazole (PROTONIX) 40 MG tablet TAKE 1 TABLET (40 MG TOTAL) BY MOUTH 2 (TWO) TIMES DAILY BEFORE A MEAL.   QUEtiapine (SEROQUEL) 25 MG tablet TAKE 2 TABLETS BY MOUTH AT BEDTIME   zolpidem (AMBIEN) 10 MG tablet Take 1 tablet (10 mg total) by mouth at bedtime as needed for sleep.   [DISCONTINUED] Amantadine HCl 100 MG tablet TAKE 1 TABLET BY MOUTH TWICE  A DAY   No facility-administered encounter medications on file as of 09/21/2022.    Objective:   PHYSICAL EXAMINATION:    VITALS:   Vitals:   09/21/22 1307  BP: (!) 131/92  Pulse: 76  SpO2: 98%  Weight: 145 lb (65.8 kg)  Height: '5\' 4"'$  (1.626 m)    GEN:  Normal appears female in no acute distress.  Appears stated age. HEENT:  Normocephalic, atraumatic. The mucous membranes are moist. The superficial temporal arteries are without ropiness or tenderness. Cardiovascular: Regular rate and rhythm. Lungs: Clear to auscultation bilaterally. Neck/Heme: There are no carotid bruits noted  bilaterally.  NEUROLOGICAL: Orientation:  The patient is alert and oriented x 3.   Cranial nerves: There is good facial symmetry.  Extraocular muscles are intact and visual fields are full to confrontational testing. Speech is fluent and clear. Soft palate rises symmetrically and there is no tongue deviation. Hearing is intact to conversational tone. Tone: Tone is good throughout. Sensation: Sensation is intact to light touch and pinprick throughout (facial, trunk, extremities). Vibration is intact at the bilateral big toe. There is no extinction with double simultaneous stimulation. There is no sensory dermatomal level identified. Coordination:  The patient has no difficulty with RAM's or FNF bilaterally. Motor: Strength is 5/5 in the bilateral upper and lower extremities.  Shoulder shrug is equal and symmetric. There is no pronator drift.  There are no fasciculations noted. DTR's: Deep tendon reflexes are 2/4 at the bilateral biceps, triceps, brachioradialis, patella and achilles.  Plantar responses are downgoing bilaterally. Gait and Station: The patient is able to ambulate without difficulty. The patient is able to heel toe walk without any difficulty. The patient is able to ambulate in a tandem fashion. The patient is able to stand in the Romberg position. Abnormal movements: There is no blepharospasm.  There is no tardive movements that are seen.  There is very rare tic of the nasalis, where the patient will inspire/inhale or exhale and contract the nasalis muscles bilaterally.    Total time spent on today's visit was 45 minutes, including both face-to-face time and nonface-to-face time.  Time included that spent on review of records (prior notes available to me/labs/imaging if pertinent), discussing treatment and goals, answering patient's questions and coordinating care.   Cc:  [name], MD

## 2022-09-21 ENCOUNTER — Encounter: Payer: Self-pay | Admitting: Neurology

## 2022-09-21 ENCOUNTER — Ambulatory Visit: Payer: Medicare HMO | Admitting: Neurology

## 2022-09-21 VITALS — BP 131/92 | HR 76 | Ht 64.0 in | Wt 145.0 lb

## 2022-09-21 DIAGNOSIS — F959 Tic disorder, unspecified: Secondary | ICD-10-CM | POA: Diagnosis not present

## 2022-09-21 MED ORDER — CLONIDINE HCL 0.1 MG PO TABS: 0.1000 mg | ORAL_TABLET | Freq: Every day | ORAL | 4 refills | Status: DC

## 2022-09-21 NOTE — Patient Instructions (Signed)
Start clonidine, 0.1 mg daily.  Watch your blood pressure closely.  The physicians and staff at [facility] are committed to providing excellent care. You may receive a survey requesting feedback about your experience at our office. We strive to receive "very good" responses to the survey questions. If you feel that your experience would prevent you from giving the office a "very good " response, please contact our office to try to remedy the situation. We may be reached at [phone]. Thank you for taking the time out of your busy day to complete the survey.

## 2022-09-22 ENCOUNTER — Telehealth: Payer: Self-pay | Admitting: Physical Medicine and Rehabilitation

## 2022-09-22 ENCOUNTER — Other Ambulatory Visit: Payer: Self-pay | Admitting: Physical Medicine and Rehabilitation

## 2022-09-22 MED ORDER — DULOXETINE HCL 30 MG PO CPEP: 30.0000 mg | ORAL_CAPSULE | Freq: Two times a day (BID) | ORAL | 1 refills | Status: DC

## 2022-09-22 NOTE — Telephone Encounter (Signed)
I called patient and advised. 

## 2022-09-22 NOTE — Telephone Encounter (Signed)
I called patient to advise we do not normally prescribe ambien. She is requesting refill on Cymbalta, not ambien. Please advise.  Last prescribed by you. Thanks.

## 2022-09-22 NOTE — Telephone Encounter (Signed)
Patient needs zolpidem refilled [facility] to [facility] in [place]

## 2022-09-23 ENCOUNTER — Telehealth: Payer: Self-pay | Admitting: Family Medicine

## 2022-09-23 ENCOUNTER — Telehealth: Payer: Self-pay | Admitting: Anesthesiology

## 2022-09-23 NOTE — Telephone Encounter (Signed)
Patient states: - She had her mammogram completed on 09/28 @ [facility] this location used to be a [employer] location which causes some problems when getting her results to her PCP  - She was informed by PCP that he was able to see the imaging but not the proper results  - She has information need to access results   I informed patient that she could come in person to complete a medical request form or call medical records, who is better able to assist her. Patient declined calling medical records. States she will bring paper upon seeing PCP again.

## 2022-09-23 NOTE — Telephone Encounter (Signed)
Patient has questions about the medication clonidine that was prescribed by Dr Tat.

## 2022-09-23 NOTE — Telephone Encounter (Signed)
Spoke with patient, patient stated she has mammogram done on 09/03/2022 has results with her Will bring results on her next appointment

## 2022-09-24 ENCOUNTER — Encounter: Payer: Self-pay | Admitting: Gastroenterology

## 2022-09-24 NOTE — Telephone Encounter (Signed)
Called patient and answered questions about her blood pressure and what is considered normal and what she needs to be aware of is low

## 2022-10-06 ENCOUNTER — Telehealth: Payer: Self-pay

## 2022-10-06 ENCOUNTER — Telehealth: Payer: Self-pay | Admitting: Neurology

## 2022-10-06 NOTE — Telephone Encounter (Signed)
Pt called in stating the clonidine is dropping her blood pressure where she is getting dizzy and having to lay down. It also is not working. She would like to find out if she can stop taking it?

## 2022-10-07 NOTE — Telephone Encounter (Signed)
Called patient and discussed with her Dr. Arturo Morton recommendations and patient at this time does not want to D/ C Cymbalta

## 2022-10-17 ENCOUNTER — Other Ambulatory Visit: Payer: Self-pay | Admitting: Physical Medicine and Rehabilitation

## 2022-10-19 ENCOUNTER — Other Ambulatory Visit: Payer: Self-pay | Admitting: Physical Medicine and Rehabilitation

## 2022-10-19 ENCOUNTER — Ambulatory Visit (AMBULATORY_SURGERY_CENTER): Payer: Self-pay

## 2022-10-19 VITALS — Ht 64.0 in | Wt 143.0 lb

## 2022-10-19 DIAGNOSIS — Z1211 Encounter for screening for malignant neoplasm of colon: Secondary | ICD-10-CM

## 2022-10-19 MED ORDER — ONDANSETRON HCL 4 MG PO TABS: 4.0000 mg | ORAL_TABLET | ORAL | 0 refills | Status: DC

## 2022-10-19 MED ORDER — NA SULFATE-K SULFATE-MG SULF 17.5-3.13-1.6 GM/177ML PO SOLN: 1.0000 | Freq: Once | ORAL | 0 refills | Status: AC

## 2022-10-19 NOTE — Progress Notes (Signed)
No egg or soy allergy known to patient   No issues known to pt with past sedation with any surgeries or procedures  Patient denies ever being told they had issues or difficulty with intubation   No FH of Malignant Hyperthermia  Pt is not on diet pills  Pt is not on  home 02   Pt is not on blood thinners   Pt has issues with constipation,  5 days of miralax bid   No A fib or A flutter  Have any cardiac testing pending--no  Pt instructed to use [employer] or GoodRx for a price reduction on prep   2 doses of zofran sent for prior to each prep dose.   Patient's chart reviewed by Osvaldo Angst CNRA prior to previsit and patient appropriate for the [place].  Previsit completed and red dot placed by patient's name on their procedure day (on provider's schedule).

## 2022-10-27 ENCOUNTER — Telehealth: Payer: Self-pay | Admitting: Gastroenterology

## 2022-10-27 NOTE — Telephone Encounter (Signed)
Patient has questions regarding her Miralax states she doesn't need to drink it 5 days before her procedure. Please advise

## 2022-10-27 NOTE — Telephone Encounter (Signed)
Returned patient call.  Patient questioning need to take Miralax , patient then admitted to 1 bowel movement per week.  Advised patient she needed to continue with Miralax as advised and increase water intake. Patient verbalized understanding.

## 2022-11-11 ENCOUNTER — Encounter: Payer: Self-pay | Admitting: Gastroenterology

## 2022-11-17 ENCOUNTER — Ambulatory Visit (AMBULATORY_SURGERY_CENTER): Payer: Medicare HMO | Admitting: Gastroenterology

## 2022-11-17 ENCOUNTER — Telehealth: Payer: Self-pay | Admitting: Gastroenterology

## 2022-11-17 ENCOUNTER — Encounter: Payer: Self-pay | Admitting: Gastroenterology

## 2022-11-17 VITALS — BP 112/68 | HR 80 | Temp 97.8°F | Resp 12 | Ht 64.0 in | Wt 143.0 lb

## 2022-11-17 DIAGNOSIS — D124 Benign neoplasm of descending colon: Secondary | ICD-10-CM

## 2022-11-17 DIAGNOSIS — Z1211 Encounter for screening for malignant neoplasm of colon: Secondary | ICD-10-CM

## 2022-11-17 DIAGNOSIS — K635 Polyp of colon: Secondary | ICD-10-CM

## 2022-11-17 DIAGNOSIS — K5140 Inflammatory polyps of colon without complications: Secondary | ICD-10-CM | POA: Diagnosis not present

## 2022-11-17 DIAGNOSIS — D123 Benign neoplasm of transverse colon: Secondary | ICD-10-CM

## 2022-11-17 DIAGNOSIS — D122 Benign neoplasm of ascending colon: Secondary | ICD-10-CM

## 2022-11-17 DIAGNOSIS — Z86010 Personal history of colonic polyps: Secondary | ICD-10-CM | POA: Diagnosis not present

## 2022-11-17 DIAGNOSIS — E785 Hyperlipidemia, unspecified: Secondary | ICD-10-CM | POA: Diagnosis not present

## 2022-11-17 DIAGNOSIS — F32A Depression, unspecified: Secondary | ICD-10-CM | POA: Diagnosis not present

## 2022-11-17 MED ORDER — SODIUM CHLORIDE 0.9 % IV SOLN: 500.0000 mL | Freq: Once | INTRAVENOUS | Status: DC

## 2022-11-17 NOTE — Progress Notes (Signed)
Vss nad trans to pacu °

## 2022-11-17 NOTE — Progress Notes (Signed)
Pt's states no medical or surgical changes since previsit or office visit. 

## 2022-11-17 NOTE — Patient Instructions (Addendum)
Information on polyps and diverticulosis given to you today.   Repeat colonoscopy after studies are complete for surveillance based on pathology results with a more extensive bowel prep. - Patient has a contact number available for emergencies. The signs and symptoms of potential delayed complications were discussed with the patient. Return to normal activities tomorrow. Written discharge instructions were provided to the patient. - High fiber diet. - Continue present medications. - Await pathology results.   YOU HAD AN ENDOSCOPIC PROCEDURE TODAY AT [facility]:   Refer to the procedure report that was given to you for any specific questions about what was found during the examination.  If the procedure report does not answer your questions, please call your gastroenterologist to clarify.  If you requested that your care partner not be given the details of your procedure findings, then the procedure report has been included in a sealed envelope for you to review at your convenience later.  YOU SHOULD EXPECT: Some feelings of bloating in the abdomen. Passage of more gas than usual.  Walking can help get rid of the air that was put into your GI tract during the procedure and reduce the bloating. If you had a lower endoscopy (such as a colonoscopy or flexible sigmoidoscopy) you may notice spotting of blood in your stool or on the toilet paper. If you underwent a bowel prep for your procedure, you may not have a normal bowel movement for a few days.  Please Note:  You might notice some irritation and congestion in your nose or some drainage.  This is from the oxygen used during your procedure.  There is no need for concern and it should clear up in a day or so.  SYMPTOMS TO REPORT IMMEDIATELY:  Following lower endoscopy (colonoscopy or flexible sigmoidoscopy):  Excessive amounts of blood in the stool  Significant tenderness or worsening of abdominal pains  Swelling of the abdomen  that is new, acute  Fever of 100F or higher   For urgent or emergent issues, a gastroenterologist can be reached at any hour by calling [phone]. Do not use MyChart messaging for urgent concerns.    DIET:  We do recommend a small meal at first, but then you may proceed to your regular diet.  Drink plenty of fluids but you should avoid alcoholic beverages for 24 hours.  ACTIVITY:  You should plan to take it easy for the rest of today and you should NOT DRIVE or use heavy machinery until tomorrow (because of the sedation medicines used during the test).    FOLLOW UP: Our staff will call the number listed on your records the next business day following your procedure.  We will call around 7:15- 8:00 am to check on you and address any questions or concerns that you may have regarding the information given to you following your procedure. If we do not reach you, we will leave a message.     If any biopsies were taken you will be contacted by phone or by letter within the next 1-3 weeks.  Please call us at [phone] if you have not heard about the biopsies in 3 weeks.    SIGNATURES/CONFIDENTIALITY: You and/or your care partner have signed paperwork which will be entered into your electronic medical record.  These signatures attest to the fact that that the information above on your After Visit Summary has been reviewed and is understood.  Full responsibility of the confidentiality of this discharge information lies with  you and/or your care-partner.
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
## 2022-11-17 NOTE — Op Note (Signed)
[facility] Patient Name: Lindsay Reyes Procedure Date: 11/17/2022 1:27 PM MRN: [id number] Endoscopist: Ladene Artist , MD, [id number] Age: 71 Referring MD:  Date of Birth: Feb 04, 1951 Gender: Female Account #: [id number] Procedure:                Colonoscopy Indications:              Screening for colorectal malignant neoplasm Medicines:                Monitored Anesthesia Care Procedure:                Pre-Anesthesia Assessment:                           - Prior to the procedure, a History and Physical                            was performed, and patient medications and                            allergies were reviewed. The patient's tolerance of                            previous anesthesia was also reviewed. The risks                            and benefits of the procedure and the sedation                            options and risks were discussed with the patient.                            All questions were answered, and informed consent                            was obtained. Prior Anticoagulants: The patient has                            taken no anticoagulant or antiplatelet agents. ASA                            Grade Assessment: II - A patient with mild systemic                            disease. After reviewing the risks and benefits,                            the patient was deemed in satisfactory condition to                            undergo the procedure.                           After obtaining informed consent, the colonoscope  was passed under direct vision. Throughout the                            procedure, the patient's blood pressure, pulse, and                            oxygen saturations were monitored continuously. The                            Olympus PCF-H190DL [id number]) Colonoscope was                            introduced through the anus and advanced to the the                            cecum,  identified by appendiceal orifice and                            ileocecal valve. The ileocecal valve, appendiceal                            orifice, and rectum were photographed. The quality                            of the bowel preparation was adequate after                            extensive lavage, suction. The colonoscopy was                            performed without difficulty. The patient tolerated                            the procedure well. Scope In: 1:30:46 PM Scope Out: 1:55:35 PM Scope Withdrawal Time: 0 hours 17 minutes 50 seconds  Total Procedure Duration: 0 hours 24 minutes 49 seconds  Findings:                 The perianal and digital rectal examinations were                            normal.                           Four sessile polyps were found in the descending                            colon, transverse colon (2) and ascending colon.                            The polyps were 7 to 8 mm in size. These polyps                            were removed with a cold snare. Resection and  retrieval were complete.                           Multiple small-mouthed diverticula were found in                            the left colon. There was narrowing of the colon in                            association with the diverticular opening. There                            was evidence of diverticular spasm. There was no                            evidence of diverticular bleeding.                           The exam was otherwise without abnormality on                            direct and retroflexion views. Complications:            No immediate complications. Estimated blood loss:                            None. Estimated Blood Loss:     Estimated blood loss: none. Impression:               - Four 7 to 8 mm polyps in the descending colon, in                            the transverse colon and in the ascending colon,                             removed with a cold snare. Resected and retrieved.                           - Moderate diverticulosis in the left colon.                           - The examination was otherwise normal on direct                            and retroflexion views. Recommendation:           - Repeat colonoscopy after studies are complete for                            surveillance based on pathology results with a more                            extensive bowel prep.                           - Patient has  a contact number available for                            emergencies. The signs and symptoms of potential                            delayed complications were discussed with the                            patient. Return to normal activities tomorrow.                            Written discharge instructions were provided to the                            patient.                           - High fiber diet.                           - Continue present medications.                           - Await pathology results. Ladene Artist, MD 11/17/2022 2:00:28 PM This report has been signed electronically.

## 2022-11-17 NOTE — Telephone Encounter (Signed)
Patient called in stating she started the second half of her prep this morning & is going to the bathroom very frequently. Stools are liquid & still darker in color, but advised her to it should start to clear up prior to procedure. She's concerned how she will get here given the frequency of her bowel movements. Advised her that she still has a few hours to be at home, she's not expected to get here until 12:30 pm.
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
## 2022-11-18 ENCOUNTER — Telehealth: Payer: Self-pay | Admitting: *Deleted

## 2022-11-18 NOTE — Telephone Encounter (Signed)
  Follow up Call-     11/17/2022   12:48 PM  Call back number  Post procedure Call Back phone  # [phone]  Permission to leave phone message Yes     Patient questions:  Do you have a fever, pain , or abdominal swelling? No. Pain Score  0 *  Have you tolerated food without any problems? Yes.    Have you been able to return to your normal activities? Yes.    Do you have any questions about your discharge instructions: Diet   No. Medications  No. Follow up visit  No.  Do you have questions or concerns about your Care? No.  Actions: * If pain score is 4 or above: No action needed, pain <4.

## 2022-11-24 ENCOUNTER — Other Ambulatory Visit: Payer: Self-pay | Admitting: Family Medicine

## 2022-12-03 ENCOUNTER — Encounter: Payer: Self-pay | Admitting: Gastroenterology

## 2022-12-18 ENCOUNTER — Other Ambulatory Visit: Payer: Self-pay | Admitting: Neurology

## 2023-01-01 ENCOUNTER — Other Ambulatory Visit: Payer: Self-pay | Admitting: Family Medicine

## 2023-02-18 ENCOUNTER — Other Ambulatory Visit: Payer: Self-pay | Admitting: Family Medicine

## 2023-02-18 NOTE — Telephone Encounter (Signed)
Last refill by Ordering and Authorizing Provider: Ladene Artist, MD On 02/10/2021

## 2023-02-22 ENCOUNTER — Telehealth: Payer: Self-pay | Admitting: Family Medicine

## 2023-02-22 NOTE — Telephone Encounter (Signed)
Copied from [place] [id number]. Topic: Medicare AWV >> Feb 22, 2023  9:37 AM Gillis Santa wrote: Reason for CRM: Called patient to schedule Medicare Annual Wellness Visit (AWV). Left message for patient to call back and schedule Medicare Annual Wellness Visit (AWV).  Last date of AWV: 02/20/2022  Please schedule an appointment at any time with Otila Kluver, [facility].  Please reschedule AWVS with health coach Otila Kluver, [place].  If any questions, please contact me at [phone].  Thank you ,  Shaune Pollack [facility] AWV TEAM Direct Dial [phone]

## 2023-02-27 ENCOUNTER — Other Ambulatory Visit: Payer: Self-pay | Admitting: Family Medicine

## 2023-03-23 ENCOUNTER — Ambulatory Visit: Payer: Medicare HMO | Admitting: Neurology

## 2023-03-24 ENCOUNTER — Encounter: Payer: Self-pay | Admitting: *Deleted

## 2023-04-11 DIAGNOSIS — S20219A Contusion of unspecified front wall of thorax, initial encounter: Secondary | ICD-10-CM | POA: Diagnosis not present

## 2023-04-11 DIAGNOSIS — T1490XA Injury, unspecified, initial encounter: Secondary | ICD-10-CM | POA: Diagnosis not present

## 2023-04-11 DIAGNOSIS — Z743 Need for continuous supervision: Secondary | ICD-10-CM | POA: Diagnosis not present

## 2023-04-11 DIAGNOSIS — M546 Pain in thoracic spine: Secondary | ICD-10-CM | POA: Diagnosis not present

## 2023-04-22 ENCOUNTER — Ambulatory Visit (INDEPENDENT_AMBULATORY_CARE_PROVIDER_SITE_OTHER): Payer: Medicare HMO | Admitting: Family Medicine

## 2023-04-22 ENCOUNTER — Encounter: Payer: Self-pay | Admitting: Family Medicine

## 2023-04-22 VITALS — BP 137/82 | HR 78 | Temp 97.5°F | Ht 64.0 in | Wt 150.0 lb

## 2023-04-22 DIAGNOSIS — R0789 Other chest pain: Secondary | ICD-10-CM | POA: Diagnosis not present

## 2023-04-22 DIAGNOSIS — M19012 Primary osteoarthritis, left shoulder: Secondary | ICD-10-CM

## 2023-04-22 DIAGNOSIS — M25512 Pain in left shoulder: Secondary | ICD-10-CM | POA: Diagnosis not present

## 2023-04-22 DIAGNOSIS — G8929 Other chronic pain: Secondary | ICD-10-CM | POA: Diagnosis not present

## 2023-04-22 DIAGNOSIS — R739 Hyperglycemia, unspecified: Secondary | ICD-10-CM

## 2023-04-22 DIAGNOSIS — G4700 Insomnia, unspecified: Secondary | ICD-10-CM

## 2023-04-22 MED ORDER — BACLOFEN 10 MG PO TABS
10.0000 mg | ORAL_TABLET | Freq: Three times a day (TID) | ORAL | 0 refills | Status: DC
Start: 2023-04-22 — End: 2023-05-04

## 2023-04-22 MED ORDER — HYDROCODONE-ACETAMINOPHEN 5-325 MG PO TABS: 1.0000 | ORAL_TABLET | Freq: Four times a day (QID) | ORAL | 0 refills | Status: DC | PRN

## 2023-04-22 MED ORDER — CELECOXIB 200 MG PO CAPS: 200.0000 mg | ORAL_CAPSULE | Freq: Two times a day (BID) | ORAL | 0 refills | Status: DC
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
## 2023-04-22 NOTE — Assessment & Plan Note (Signed)
Stable on Ambien 10 mg nightly and Seroquel 50 mg daily.  She is aware to not take Ambien with the norco.

## 2023-04-22 NOTE — Assessment & Plan Note (Signed)
May be contributing to above shoulder pain though may have also suffered an acute injury.  Will be starting Celebrex as above.  Needs to be seen by sports medicine if not improving.

## 2023-04-22 NOTE — Patient Instructions (Signed)
It was very nice to see you today!  Please start the Celebrex twice daily.  Take the baclofen 3 times daily.  Use the hydrocodone as needed for pain.  Let me know if not improving in the next couple of weeks.  Return if symptoms worsen or fail to improve.   Take care, Dr Jimmey Ralph  PLEASE NOTE:  If you had any lab tests, please let us know if you have not heard back within a few days. You may see your results on mychart before we have a chance to review them but we will give you a call once they are reviewed by [place].   If we ordered any referrals today, please let us know if you have not heard from their office within the next week.   If you had any urgent prescriptions sent in today, please check with the pharmacy within an hour of our visit to make sure the prescription was transmitted appropriately.   Please try these tips to maintain a healthy lifestyle:  Eat at least 3 REAL meals and 1-2 snacks per day.  Aim for no more than 5 hours between eating.  If you eat breakfast, please do so within one hour of getting up.   Each meal should contain half fruits/vegetables, one quarter protein, and one quarter carbs (no bigger than a computer mouse)  Cut down on sweet beverages. This includes juice, soda, and sweet tea.   Drink at least 1 glass of water with each meal and aim for at least 8 glasses per day  Exercise at least 150 minutes every week.

## 2023-04-25 DIAGNOSIS — G9389 Other specified disorders of brain: Secondary | ICD-10-CM | POA: Diagnosis not present

## 2023-04-25 DIAGNOSIS — S20219A Contusion of unspecified front wall of thorax, initial encounter: Secondary | ICD-10-CM | POA: Diagnosis not present

## 2023-04-25 DIAGNOSIS — G930 Cerebral cysts: Secondary | ICD-10-CM | POA: Diagnosis not present

## 2023-04-25 DIAGNOSIS — R479 Unspecified speech disturbances: Secondary | ICD-10-CM | POA: Diagnosis not present

## 2023-04-25 DIAGNOSIS — R9401 Abnormal electroencephalogram [EEG]: Secondary | ICD-10-CM | POA: Diagnosis not present

## 2023-04-25 DIAGNOSIS — I10 Essential (primary) hypertension: Secondary | ICD-10-CM | POA: Diagnosis not present

## 2023-04-25 DIAGNOSIS — K8020 Calculus of gallbladder without cholecystitis without obstruction: Secondary | ICD-10-CM | POA: Diagnosis not present

## 2023-04-25 DIAGNOSIS — S2222XA Fracture of body of sternum, initial encounter for closed fracture: Secondary | ICD-10-CM | POA: Diagnosis not present

## 2023-04-25 DIAGNOSIS — I773 Arterial fibromuscular dysplasia: Secondary | ICD-10-CM | POA: Diagnosis not present

## 2023-04-25 DIAGNOSIS — G4700 Insomnia, unspecified: Secondary | ICD-10-CM | POA: Diagnosis not present

## 2023-04-25 DIAGNOSIS — Z8673 Personal history of transient ischemic attack (TIA), and cerebral infarction without residual deficits: Secondary | ICD-10-CM | POA: Diagnosis not present

## 2023-04-25 DIAGNOSIS — K5730 Diverticulosis of large intestine without perforation or abscess without bleeding: Secondary | ICD-10-CM | POA: Diagnosis not present

## 2023-04-25 DIAGNOSIS — R2990 Unspecified symptoms and signs involving the nervous system: Secondary | ICD-10-CM | POA: Diagnosis not present

## 2023-04-25 DIAGNOSIS — I639 Cerebral infarction, unspecified: Secondary | ICD-10-CM | POA: Diagnosis not present

## 2023-04-25 DIAGNOSIS — I6521 Occlusion and stenosis of right carotid artery: Secondary | ICD-10-CM | POA: Diagnosis not present

## 2023-04-25 DIAGNOSIS — R4701 Aphasia: Secondary | ICD-10-CM | POA: Diagnosis not present

## 2023-04-25 DIAGNOSIS — R000 Tachycardia, unspecified: Secondary | ICD-10-CM | POA: Diagnosis not present

## 2023-04-25 DIAGNOSIS — F32A Depression, unspecified: Secondary | ICD-10-CM | POA: Diagnosis not present

## 2023-04-25 DIAGNOSIS — M4126 Other idiopathic scoliosis, lumbar region: Secondary | ICD-10-CM | POA: Diagnosis not present

## 2023-04-25 DIAGNOSIS — R0789 Other chest pain: Secondary | ICD-10-CM | POA: Diagnosis not present

## 2023-04-25 DIAGNOSIS — I771 Stricture of artery: Secondary | ICD-10-CM | POA: Diagnosis not present

## 2023-04-25 DIAGNOSIS — I700 Atherosclerosis of aorta: Secondary | ICD-10-CM | POA: Diagnosis not present

## 2023-04-25 DIAGNOSIS — D72819 Decreased white blood cell count, unspecified: Secondary | ICD-10-CM | POA: Diagnosis not present

## 2023-04-25 DIAGNOSIS — S2222XD Fracture of body of sternum, subsequent encounter for fracture with routine healing: Secondary | ICD-10-CM | POA: Diagnosis not present

## 2023-04-25 DIAGNOSIS — E871 Hypo-osmolality and hyponatremia: Secondary | ICD-10-CM | POA: Diagnosis not present

## 2023-04-25 DIAGNOSIS — R079 Chest pain, unspecified: Secondary | ICD-10-CM | POA: Diagnosis not present

## 2023-04-28 ENCOUNTER — Telehealth: Payer: Self-pay

## 2023-04-28 NOTE — Transitions of Care (Post Inpatient/ED Visit) (Signed)
04/28/2023  Name: Lindsay Reyes MRN: [id number] DOB: 06-Sep-1951  Today's TOC FU Call Status: Today's TOC FU Call Status:: Successful TOC FU Call Competed TOC FU Call Complete Date: 04/28/23  Transition Care Management Follow-up Telephone Call Date of Discharge: 04/27/23 Discharge Facility: Other ([facility]) Name of Other ([facility]) Discharge Facility: [facility] Type of Discharge: Inpatient Admission Primary Inpatient Discharge Diagnosis:: "expressive aphasia" How have you been since you were released from the hospital?: Better (Pt states she is is still tired but doing a little better-still has trouble 'getting words that are in her head out"-states she still has pain from fractured sternum-taking pain med as ordered) Any questions or concerns?: No  Items Reviewed: Did you receive and understand the discharge instructions provided?: Yes Medications obtained,verified, and reconciled?: Yes (Medications Reviewed) Any new allergies since your discharge?: No Dietary orders reviewed?: Yes Type of Diet Ordered:: low salt/heart healthy Do you have support at home?: Yes People in Home: alone Name of Support/Comfort Primary Source: sister coming to stay with her while she recovers  Medications Reviewed Today: Medications Reviewed Today     Reviewed by Charlyn Minerva, RN (Registered Nurse) on 04/28/23 at 1113  Med List Status: <None>   Medication Order Taking? Sig Documenting Provider Last Dose Status Informant  aspirin 81 MG chewable tablet [id number] Yes Chew by mouth daily. [provider] Taking Active Self  atorvastatin (LIPITOR) 40 MG tablet [id number] Yes Take 40 mg by mouth at bedtime. [provider] Taking Active Self  baclofen (LIORESAL) 10 MG tablet [id number] Yes Take 1 tablet (10 mg total) by mouth 3 (three) times daily. Ardith Dark, MD Taking Active   celecoxib (CELEBREX) 200 MG capsule [id number] Yes Take 1 capsule (200  mg total) by mouth 2 (two) times daily. Ardith Dark, MD Taking Active   chlorhexidine (PERIDEX) 0.12 % solution [id number]   [provider]  Active   Cholecalciferol (VITAMIN D3) 50 MCG (2000 UT) CAPS [id number]  Take by mouth. [provider]  Active   cloNIDine (CATAPRES) 0.1 MG tablet [id number]  Take 1 tablet (0.1 mg total) by mouth daily. Vladimir Faster, DO  Active   DULoxetine (CYMBALTA) 30 MG capsule [id number] Yes TAKE 1 CAPSULE BY MOUTH 2 TIMES DAILY. Juanda Chance, NP Taking Active   HYDROcodone-acetaminophen [facility]) 5-325 MG tablet [id number] Yes Take 1 tablet by mouth every 6 (six) hours as needed for moderate pain. Ardith Dark, MD Taking Active   ibuprofen (ADVIL) 800 MG tablet [id number]  Take 800 mg by mouth every 6 (six) hours as needed. [provider]  Active   MELATONIN GUMMIES PO [id number] Yes Take 40 mg by mouth at bedtime as needed. Takes 4 10 mg gummies [provider] Taking Active   Multiple Vitamin (MULTIVITAMIN) tablet [id number] Yes Take 1 tablet by mouth daily. [provider] Taking Active   ondansetron (ZOFRAN) 4 MG tablet [id number]  Take 1 tablet (4 mg total) by mouth as directed for 2 doses. Take one Zofran 4 mg tablet 30-60 minutes before each prep dose Meryl Dare, MD  Active   pantoprazole (PROTONIX) 40 MG tablet [id number] Yes TAKE 1 TABLET (40 MG TOTAL) BY MOUTH 2 (TWO) TIMES DAILY BEFORE A MEAL. Ardith Dark, MD Taking Active   QUEtiapine (SEROQUEL) 25 MG tablet [id number] Yes TAKE 2 TABLETS BY MOUTH AT BEDTIME Ardith Dark, MD Taking Active   zolpidem (AMBIEN) 10 MG tablet [id number] Yes TAKE 1 TABLET BY  MOUTH AT BEDTIME AS NEEDED FOR SLEEP. Ardith Dark, MD Taking Active             Home Care and Equipment/Supplies: Were Home Health Services Ordered?: NA Any new equipment or medical supplies ordered?: NA  Functional Questionnaire: Do you need assistance with bathing/showering or dressing?:  No Do you need assistance with meal preparation?: No Do you need assistance with eating?: No Do you have difficulty maintaining continence: No Do you need assistance with getting out of bed/getting out of a chair/moving?: No Do you have difficulty managing or taking your medications?: No  Follow up appointments reviewed: PCP Follow-up appointment confirmed?: Yes Date of PCP follow-up appointment?: 05/04/23 Follow-up Provider: Dr. Jimmey Ralph Specialist [facility] Follow-up appointment confirmed?: No Reason Specialist Follow-Up Not Confirmed: Patient has Specialist Provider Number and will Call for Appointment (pt has been referred to neuro and awaiting follow up appt) Do you need transportation to your follow-up appointment?: No Do you understand care options if your condition(s) worsen?: Yes-patient verbalized understanding  SDOH Interventions Today    Flowsheet Row Most Recent Value  SDOH Interventions   Food Insecurity Interventions Intervention Not Indicated  Transportation Interventions Intervention Not Indicated      TOC Interventions Today    Flowsheet Row Most Recent Value  TOC Interventions   TOC Interventions Discussed/Reviewed TOC Interventions Discussed, Arranged PCP follow up within 7 days/Care Guide scheduled      Interventions Today    Flowsheet Row Most Recent Value  General Interventions   General Interventions Discussed/Reviewed General Interventions Discussed, Doctor Visits  Doctor Visits Discussed/Reviewed Doctor Visits Discussed, PCP, Specialist  PCP/Specialist Visits Compliance with follow-up visit  Education Interventions   Education Provided Provided Education  Provided Verbal Education On Nutrition, When to see the doctor, Medication  Nutrition Interventions   Nutrition Discussed/Reviewed Nutrition Discussed, Adding fruits and vegetables, Decreasing salt  Pharmacy Interventions   Pharmacy Dicussed/Reviewed Pharmacy Topics Discussed, Medications and  their functions  Safety Interventions   Safety Discussed/Reviewed Safety Discussed, Home Safety  [pt states she lives alone but has sister who is coming to stay with her while she recovers]       Antionette Fairy, RN,BSN,CCM [facility] Care Management Care Management Community Coordinator Direct Phone: [phone] Toll Free: [phone] Fax: [phone]

## 2023-04-29 DIAGNOSIS — R2990 Unspecified symptoms and signs involving the nervous system: Secondary | ICD-10-CM | POA: Diagnosis not present

## 2023-05-04 ENCOUNTER — Encounter: Payer: Self-pay | Admitting: Family Medicine

## 2023-05-04 ENCOUNTER — Ambulatory Visit (INDEPENDENT_AMBULATORY_CARE_PROVIDER_SITE_OTHER): Payer: Medicare HMO | Admitting: Family Medicine

## 2023-05-04 VITALS — BP 121/78 | HR 85 | Temp 97.5°F | Ht 64.0 in

## 2023-05-04 DIAGNOSIS — R4701 Aphasia: Secondary | ICD-10-CM

## 2023-05-04 DIAGNOSIS — N281 Cyst of kidney, acquired: Secondary | ICD-10-CM | POA: Diagnosis not present

## 2023-05-04 DIAGNOSIS — R739 Hyperglycemia, unspecified: Secondary | ICD-10-CM | POA: Diagnosis not present

## 2023-05-04 DIAGNOSIS — N2889 Other specified disorders of kidney and ureter: Secondary | ICD-10-CM | POA: Diagnosis not present

## 2023-05-04 DIAGNOSIS — S2220XS Unspecified fracture of sternum, sequela: Secondary | ICD-10-CM

## 2023-05-04 DIAGNOSIS — E785 Hyperlipidemia, unspecified: Secondary | ICD-10-CM

## 2023-05-04 DIAGNOSIS — S2220XD Unspecified fracture of sternum, subsequent encounter for fracture with routine healing: Secondary | ICD-10-CM | POA: Diagnosis not present

## 2023-05-04 NOTE — Patient Instructions (Signed)
It was very nice to see you today!  I am glad that you are doing better.  Please continue your statin and aspirin.   Please come back soon for your annual physical.  Return in about 2 months (around 07/04/2023).   Take care, Dr Jimmey Ralph  PLEASE NOTE:  If you had any lab tests, please let us know if you have not heard back within a few days. You may see your results on mychart before we have a chance to review them but we will give you a call once they are reviewed by [place].   If we ordered any referrals today, please let us know if you have not heard from their office within the next week.   If you had any urgent prescriptions sent in today, please check with the pharmacy within an hour of our visit to make sure the prescription was transmitted appropriately.   Please try these tips to maintain a healthy lifestyle:  Eat at least 3 REAL meals and 1-2 snacks per day.  Aim for no more than 5 hours between eating.  If you eat breakfast, please do so within one hour of getting up.   Each meal should contain half fruits/vegetables, one quarter protein, and one quarter carbs (no bigger than a computer mouse)  Cut down on sweet beverages. This includes juice, soda, and sweet tea.   Drink at least 1 glass of water with each meal and aim for at least 8 glasses per day  Exercise at least 150 minutes every week.
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
## 2023-05-04 NOTE — Assessment & Plan Note (Signed)
She is doing well on Lipitor 40 mg daily.  She will come back soon for annual physical and we can recheck labs at that time.

## 2023-05-04 NOTE — Assessment & Plan Note (Signed)
Check A1c when she comes back for CPe.

## 2023-05-05 ENCOUNTER — Other Ambulatory Visit: Payer: Self-pay | Admitting: Family Medicine

## 2023-05-11 ENCOUNTER — Ambulatory Visit (INDEPENDENT_AMBULATORY_CARE_PROVIDER_SITE_OTHER): Payer: Medicare HMO

## 2023-05-11 VITALS — Wt 150.0 lb

## 2023-05-11 DIAGNOSIS — Z0000 Encounter for general adult medical examination without abnormal findings: Secondary | ICD-10-CM | POA: Diagnosis not present

## 2023-05-11 NOTE — Patient Instructions (Signed)
Lindsay Reyes , Thank you for taking time to come for your Medicare Wellness Visit. I appreciate your ongoing commitment to your health goals. Please review the following plan we discussed and let me know if I can assist you in the future.   These are the goals we discussed:  Goals      Patient Stated     None at this time      Patient Stated     Get cholesterol down and less sugar         This is a list of the screening recommended for you and due dates:  Health Maintenance  Topic Date Due   DEXA scan (bone density measurement)  05/05/2024   Medicare Annual Wellness Visit  05/10/2024   DTaP/Tdap/Td vaccine (4 - Td or Tdap) 05/23/2024   Colon Cancer Screening  11/18/2027   Hepatitis C Screening  Completed   Zoster (Shingles) Vaccine  Completed   HPV Vaccine  Aged Out   Pneumonia Vaccine  Discontinued   Flu Shot  Discontinued   Mammogram  Discontinued   COVID-19 Vaccine  Discontinued    Advanced directives: copies in chart   Conditions/risks identified: lower cholesterol and less sugar   Next appointment: Follow up in one year for your annual wellness visit    Preventive Care 65 Years and Older, Female Preventive care refers to lifestyle choices and visits with your health care provider that can promote health and wellness. What does preventive care include? A yearly physical exam. This is also called an annual well check. Dental exams once or twice a year. Routine eye exams. Ask your health care provider how often you should have your eyes checked. Personal lifestyle choices, including: Daily care of your teeth and gums. Regular physical activity. Eating a healthy diet. Avoiding tobacco and drug use. Limiting alcohol use. Practicing safe sex. Taking low-dose aspirin every day. Taking vitamin and mineral supplements as recommended by your health care provider. What happens during an annual well check? The services and screenings done by your health care provider  during your annual well check will depend on your age, overall health, lifestyle risk factors, and family history of disease. Counseling  Your health care provider may ask you questions about your: Alcohol use. Tobacco use. Drug use. Emotional well-being. Home and relationship well-being. Sexual activity. Eating habits. History of falls. Memory and ability to understand (cognition). Work and work Astronomer. Reproductive health. Screening  You may have the following tests or measurements: Height, weight, and BMI. Blood pressure. Lipid and cholesterol levels. These may be checked every 5 years, or more frequently if you are over 74 years old. Skin check. Lung cancer screening. You may have this screening every year starting at age 36 if you have a 30-pack-year history of smoking and currently smoke or have quit within the past 15 years. Fecal occult blood test (FOBT) of the stool. You may have this test every year starting at age 57. Flexible sigmoidoscopy or colonoscopy. You may have a sigmoidoscopy every 5 years or a colonoscopy every 10 years starting at age 18. Hepatitis C blood test. Hepatitis B blood test. Sexually transmitted disease (STD) testing. Diabetes screening. This is done by checking your blood sugar (glucose) after you have not eaten for a while (fasting). You may have this done every 1-3 years. Bone density scan. This is done to screen for osteoporosis. You may have this done starting at age 46. Mammogram. This may be done every 1-2 years. Talk  to your health care provider about how often you should have regular mammograms. Talk with your health care provider about your test results, treatment options, and if necessary, the need for more tests. Vaccines  Your health care provider may recommend certain vaccines, such as: Influenza vaccine. This is recommended every year. Tetanus, diphtheria, and acellular pertussis (Tdap, Td) vaccine. You may need a Td booster every  10 years. Zoster vaccine. You may need this after age 82. Pneumococcal 13-valent conjugate (PCV13) vaccine. One dose is recommended after age 66. Pneumococcal polysaccharide (PPSV23) vaccine. One dose is recommended after age 75. Talk to your health care provider about which screenings and vaccines you need and how often you need them. This information is not intended to replace advice given to you by your health care provider. Make sure you discuss any questions you have with your health care provider. Document Released: 12/20/2015 Document Revised: 08/12/2016 Document Reviewed: 09/24/2015 Elsevier Interactive Patient Education  2017 [employer].  Fall Prevention in the Home Falls can cause injuries. They can happen to people of all ages. There are many things you can do to make your home safe and to help prevent falls. What can I do on the outside of my home? Regularly fix the edges of walkways and driveways and fix any cracks. Remove anything that might make you trip as you walk through a door, such as a raised step or threshold. Trim any bushes or trees on the path to your home. Use bright outdoor lighting. Clear any walking paths of anything that might make someone trip, such as rocks or tools. Regularly check to see if handrails are loose or broken. Make sure that both sides of any steps have handrails. Any raised decks and porches should have guardrails on the edges. Have any leaves, snow, or ice cleared regularly. Use sand or salt on walking paths during winter. Clean up any spills in your garage right away. This includes oil or grease spills. What can I do in the bathroom? Use night lights. Install grab bars by the toilet and in the tub and shower. Do not use towel bars as grab bars. Use non-skid mats or decals in the tub or shower. If you need to sit down in the shower, use a plastic, non-slip stool. Keep the floor dry. Clean up any water that spills on the floor as soon as it  happens. Remove soap buildup in the tub or shower regularly. Attach bath mats securely with double-sided non-slip rug tape. Do not have throw rugs and other things on the floor that can make you trip. What can I do in the bedroom? Use night lights. Make sure that you have a light by your bed that is easy to reach. Do not use any sheets or blankets that are too big for your bed. They should not hang down onto the floor. Have a firm chair that has side arms. You can use this for support while you get dressed. Do not have throw rugs and other things on the floor that can make you trip. What can I do in the kitchen? Clean up any spills right away. Avoid walking on wet floors. Keep items that you use a lot in easy-to-reach places. If you need to reach something above you, use a strong step stool that has a grab bar. Keep electrical cords out of the way. Do not use floor polish or wax that makes floors slippery. If you must use wax, use non-skid floor wax. Do  not have throw rugs and other things on the floor that can make you trip. What can I do with my stairs? Do not leave any items on the stairs. Make sure that there are handrails on both sides of the stairs and use them. Fix handrails that are broken or loose. Make sure that handrails are as long as the stairways. Check any carpeting to make sure that it is firmly attached to the stairs. Fix any carpet that is loose or worn. Avoid having throw rugs at the top or bottom of the stairs. If you do have throw rugs, attach them to the floor with carpet tape. Make sure that you have a light switch at the top of the stairs and the bottom of the stairs. If you do not have them, ask someone to add them for you. What else can I do to help prevent falls? Wear shoes that: Do not have high heels. Have rubber bottoms. Are comfortable and fit you well. Are closed at the toe. Do not wear sandals. If you use a stepladder: Make sure that it is fully opened.  Do not climb a closed stepladder. Make sure that both sides of the stepladder are locked into place. Ask someone to hold it for you, if possible. Clearly mark and make sure that you can see: Any grab bars or handrails. First and last steps. Where the edge of each step is. Use tools that help you move around (mobility aids) if they are needed. These include: Canes. Walkers. Scooters. Crutches. Turn on the lights when you go into a dark area. Replace any light bulbs as soon as they burn out. Set up your furniture so you have a clear path. Avoid moving your furniture around. If any of your floors are uneven, fix them. If there are any pets around you, be aware of where they are. Review your medicines with your doctor. Some medicines can make you feel dizzy. This can increase your chance of falling. Ask your doctor what other things that you can do to help prevent falls. This information is not intended to replace advice given to you by your health care provider. Make sure you discuss any questions you have with your health care provider. Document Released: 09/19/2009 Document Revised: 04/30/2016 Document Reviewed: 12/28/2014 Elsevier Interactive Patient Education  2017 [employer].

## 2023-05-11 NOTE — Progress Notes (Signed)
I connected with  Fredna Dow on 05/11/23 by a audio enabled telemedicine application and verified that I am speaking with the correct person using two identifiers.  Patient Location: Home  Provider Location: Office/Clinic  I discussed the limitations of evaluation and management by telemedicine. The patient expressed understanding and agreed to proceed.   Subjective:   Lindsay Reyes is a 72 y.o. female who presents for Medicare Annual (Subsequent) preventive examination.  Review of Systems     Cardiac Risk Factors include: advanced age (>67men, >70 women);dyslipidemia;sedentary lifestyle     Objective:    Today's Vitals   05/11/23 1328 05/11/23 1329  Weight: 150 lb (68 kg)   PainSc:  0-No pain   Body mass index is 25.75 kg/m.     05/11/2023    1:34 PM 09/21/2022    1:08 PM 03/04/2022   12:03 PM 02/20/2022    2:25 PM 02/13/2021   11:53 AM 11/26/2020    3:17 PM 01/15/2020    1:30 PM  Advanced Directives  Does Patient Have a Medical Advance Directive? Yes Yes Yes Yes No No No  Type of Estate agent of [place];Living will Living will Healthcare Power of [employer] Power of [employer] Power of Attorney    Does patient want to make changes to medical advance directive? No - Patient declined    No - Patient declined    Copy of Healthcare Power of Attorney in Chart? Yes - validated most recent copy scanned in chart (See row information)  No - copy requested Yes - validated most recent copy scanned in chart (See row information)     Would patient like information on creating a medical advance directive?       No - Patient declined    Current Medications (verified) Outpatient Encounter Medications as of 05/11/2023  Medication Sig   aspirin 81 MG chewable tablet Chew by mouth daily.   atorvastatin (LIPITOR) 40 MG tablet Take 40 mg by mouth at bedtime.   Cholecalciferol (VITAMIN D3) 50 MCG (2000 UT) CAPS Take by mouth.   DULoxetine (CYMBALTA) 30  MG capsule TAKE 1 CAPSULE BY MOUTH 2 TIMES DAILY.   ibuprofen (ADVIL) 800 MG tablet Take 800 mg by mouth every 6 (six) hours as needed.   MELATONIN GUMMIES PO Take 40 mg by mouth at bedtime as needed. Takes 4 10 mg gummies   Multiple Vitamin (MULTIVITAMIN) tablet Take 1 tablet by mouth daily.   pantoprazole (PROTONIX) 40 MG tablet TAKE 1 TABLET (40 MG TOTAL) BY MOUTH 2 (TWO) TIMES DAILY BEFORE A MEAL.   QUEtiapine (SEROQUEL) 25 MG tablet TAKE 2 TABLETS BY MOUTH AT BEDTIME   zolpidem (AMBIEN) 10 MG tablet TAKE 1 TABLET BY MOUTH AT BEDTIME AS NEEDED FOR SLEEP.   chlorhexidine (PERIDEX) 0.12 % solution  (Patient not taking: Reported on 05/11/2023)   [DISCONTINUED] cloNIDine (CATAPRES) 0.1 MG tablet Take 1 tablet (0.1 mg total) by mouth daily.   No facility-administered encounter medications on file as of 05/11/2023.    Allergies (verified) Patient has no known allergies.   History: Past Medical History:  Diagnosis Date   Arthritis    Colon polyp    Depression    Diverticulosis    GERD (gastroesophageal reflux disease)    Hyperlipidemia    Insomnia    Retinal micro-aneurysm of right eye    Scoliosis    Past Surgical History:  Procedure Laterality Date   ABDOMINAL HYSTERECTOMY  2002   COLONOSCOPY  2004  as of 10/19/22-pt unsure when had it done and not sure if she had polyps   UPPER GASTROINTESTINAL ENDOSCOPY  2021   2008 both with MS   Family History  Problem Relation Age of Onset   Hypertension Mother    Diabetes Mother    Osteoporosis Mother    Heart Problems Mother    Colon polyps Sister    Breast cancer Sister    Colon cancer Sister    Thyroid cancer Sister    Esophageal cancer Neg Hx    Stomach cancer Neg Hx    Rectal cancer Neg Hx    Social History   Socioeconomic History   Marital status: Single    Spouse name: Not on file   Number of children: Not on file   Years of education: Not on file   Highest education level: Not on file  Occupational History   Not  on file  Tobacco Use   Smoking status: Never   Smokeless tobacco: Never  Vaping Use   Vaping Use: Never used  Substance and Sexual Activity   Alcohol use: Not Currently    Comment: socially, couple times a year   Drug use: Never   Sexual activity: Not on file  Other Topics Concern   Not on file  Social History Narrative   Right handed   Retired    International aid/development worker of Corporate investment banker Strain: Low Risk  (05/11/2023)   Overall Financial Resource Strain (CARDIA)    Difficulty of Paying Living Expenses: Not hard at all  Food Insecurity: No Food Insecurity (05/11/2023)   Hunger Vital Sign    Worried About Running Out of Food in the Last Year: Never true    Ran Out of Food in the Last Year: Never true  Transportation Needs: No Transportation Needs (05/11/2023)   PRAPARE - Administrator, Civil Service (Medical): No    Lack of Transportation (Non-Medical): No  Physical Activity: Sufficiently Active (05/11/2023)   Exercise Vital Sign    Days of Exercise per Week: 7 days    Minutes of Exercise per Session: 30 min  Stress: No Stress Concern Present (05/11/2023)   [employer] of Occupational Health - Occupational Stress Questionnaire    Feeling of Stress : Not at all  Social Connections: Socially Isolated (05/11/2023)   Social Connection and Isolation Panel [NHANES]    Frequency of Communication with Friends and Family: Three times a week    Frequency of Social Gatherings with Friends and Family: Once a week    Attends Religious Services: Never    Database administrator or Organizations: No    Attends Engineer, structural: Never    Marital Status: Never married    Tobacco Counseling Counseling given: Not Answered   Clinical Intake:  Pre-visit preparation completed: Yes  Pain : No/denies pain Pain Score: 0-No pain     BMI - recorded: 25.75 Nutritional Status: BMI 25 -29 Overweight Nutritional Risks: None Diabetes: No  How often do you  need to have someone help you when you read instructions, pamphlets, or other written materials from your doctor or pharmacy?: 1 - Never  Diabetic?no  Interpreter Needed?: No  Information entered by :: Lanier Ensign, LPN   Activities of Daily Living    05/11/2023    1:35 PM  In your present state of health, do you have any difficulty performing the following activities:  Hearing? 0  Vision? 0  Difficulty concentrating or making  decisions? 0  Walking or climbing stairs? 0  Dressing or bathing? 0  Doing errands, shopping? 0  Preparing Food and eating ? N  Using the Toilet? N  In the past six months, have you accidently leaked urine? Y  Comment at times  Do you have problems with loss of bowel control? N  Managing your Medications? N  Managing your Finances? N  Housekeeping or managing your Housekeeping? N    Patient Care Team: Ardith Dark, MD as PCP - General (Family Medicine) Meryl Dare, MD as Consulting Physician (Gastroenterology) Andrena Mews, DO as Consulting Physician (Sports Medicine) Lonn Georgia, OD (Optometry)  Indicate any recent Medical Services you may have received from other than [facility] providers in the past year (date may be approximate).     Assessment:   This is a routine wellness examination for Lindsay Reyes.  Hearing/Vision screen Hearing Screening - Comments:: Pt denies any hearing issues  Vision Screening - Comments:: Pt follows up with Dr Maralyn Sago for annual eye exams   Dietary issues and exercise activities discussed: Current Exercise Habits: Home exercise routine, Type of exercise: walking;stretching, Time (Minutes): 30, Frequency (Times/Week): 7, Weekly Exercise (Minutes/Week): 210   Goals Addressed             This Visit's Progress    Patient Stated       Get cholesterol down and less sugar        Depression Screen    05/11/2023    1:33 PM 05/04/2023    1:09 PM 04/22/2023   10:20 AM 09/14/2022    1:26 PM 06/29/2022    10:52 AM 06/12/2022    1:13 PM 02/20/2022    2:24 PM  PHQ 2/9 Scores  PHQ - 2 Score 0 0 0 0 0 0 0    Fall Risk    05/11/2023    1:35 PM 05/04/2023    1:09 PM 04/22/2023   10:20 AM 09/21/2022    1:08 PM 09/14/2022    1:26 PM  Fall Risk   Falls in the past year? 0 0 0 0 0  Number falls in past yr: 0 0 0  0  Injury with Fall? 0 0 0  0  Risk for fall due to : Impaired vision No Fall Risks No Fall Risks  No Fall Risks  Follow up Falls prevention discussed   Falls evaluation completed     FALL RISK PREVENTION PERTAINING TO THE HOME:  Any stairs in or around the home? No  If so, are there any without handrails? No  Home free of loose throw rugs in walkways, pet beds, electrical cords, etc? Yes  Adequate lighting in your home to reduce risk of falls? Yes   ASSISTIVE DEVICES UTILIZED TO PREVENT FALLS:  Life alert? No  Use of a cane, walker or w/c? No  Grab bars in the bathroom? No  Shower chair or bench in shower? Yes  Elevated toilet seat or a handicapped toilet? No   TIMED UP AND GO:  Was the test performed? No .   Cognitive Function:        05/11/2023    1:36 PM 01/15/2020    1:31 PM  6CIT Screen  What Year? 0 points 0 points  What month? 0 points 0 points  What time? 0 points 0 points  Count back from 20 0 points 0 points  Months in reverse 4 points 0 points  Repeat phrase 0 points 0 points  Total Score  4 points 0 points    Immunizations Immunization History  Administered Date(s) Administered   Td 12/08/2003   Td (Adult),5 Lf Tetanus Toxid, Preservative Free 12/08/2003   Tdap 05/23/2014   Zoster Recombinat (Shingrix) 12/26/2019, 03/25/2020    TDAP status: Up to date  Flu Vaccine status: Declined, Education has been provided regarding the importance of this vaccine but patient still declined. Advised may receive this vaccine at local pharmacy or Health Dept. Aware to provide a copy of the vaccination record if obtained from local pharmacy or Health Dept. Verbalized  acceptance and understanding.  Pneumococcal vaccine status: Declined,  Education has been provided regarding the importance of this vaccine but patient still declined. Advised may receive this vaccine at local pharmacy or Health Dept. Aware to provide a copy of the vaccination record if obtained from local pharmacy or Health Dept. Verbalized acceptance and understanding.   Covid-19 vaccine status: Declined, Education has been provided regarding the importance of this vaccine but patient still declined. Advised may receive this vaccine at local pharmacy or Health Dept.or vaccine clinic. Aware to provide a copy of the vaccination record if obtained from local pharmacy or Health Dept. Verbalized acceptance and understanding.  Qualifies for Shingles Vaccine? Yes   Zostavax completed Yes   Shingrix Completed?: Yes  Screening Tests Health Maintenance  Topic Date Due   DEXA SCAN  05/05/2024   Medicare Annual Wellness (AWV)  05/10/2024   DTaP/Tdap/Td (4 - Td or Tdap) 05/23/2024   Colonoscopy  11/18/2027   Hepatitis C Screening  Completed   Zoster Vaccines- Shingrix  Completed   HPV VACCINES  Aged Out   Pneumonia Vaccine 64+ Years old  Discontinued   INFLUENZA VACCINE  Discontinued   MAMMOGRAM  Discontinued   COVID-19 Vaccine  Discontinued    Health Maintenance  There are no preventive care reminders to display for this patient.   Colorectal cancer screening: Type of screening: Colonoscopy. Completed 11/17/22. Repeat every 5 years  Mammogram status: Completed 09/03/22. Repeat every year  Bone Density status: Completed 05/05/22. Results reflect: Bone density results: OSTEOPENIA. Repeat every 2 years.  Additional Screening:  Hepatitis C Screening: Completed 09/29/18  Vision Screening: Recommended annual ophthalmology exams for early detection of glaucoma and other disorders of the eye. Is the patient up to date with their annual eye exam?  No  Who is the provider or what is the name of  the office in which the patient attends annual eye exams? Dr daugherty  If pt is not established with a provider, would they like to be referred to a provider to establish care? No .   Dental Screening: Recommended annual dental exams for proper oral hygiene  Community Resource Referral / Chronic Care Management: CRR required this visit?  No   CCM required this visit?  No      Plan:     I have personally reviewed and noted the following in the patient's chart:   Medical and social history Use of alcohol, tobacco or illicit drugs  Current medications and supplements including opioid prescriptions. Patient is not currently taking opioid prescriptions. Functional ability and status Nutritional status Physical activity Advanced directives List of other physicians Hospitalizations, surgeries, and ER visits in previous 12 months Vitals Screenings to include cognitive, depression, and falls Referrals and appointments  In addition, I have reviewed and discussed with patient certain preventive protocols, quality metrics, and best practice recommendations. A written personalized care plan for preventive services as well as general preventive health recommendations  were provided to patient.     Marzella Schlein, LPN   12/12/1094   Nurse Notes: none

## 2023-05-14 ENCOUNTER — Other Ambulatory Visit: Payer: Self-pay | Admitting: Physical Medicine and Rehabilitation

## 2023-05-21 ENCOUNTER — Ambulatory Visit (INDEPENDENT_AMBULATORY_CARE_PROVIDER_SITE_OTHER): Payer: Medicare HMO

## 2023-05-21 ENCOUNTER — Telehealth: Payer: Self-pay | Admitting: Family Medicine

## 2023-05-21 ENCOUNTER — Ambulatory Visit (INDEPENDENT_AMBULATORY_CARE_PROVIDER_SITE_OTHER): Payer: Medicare HMO | Admitting: Family Medicine

## 2023-05-21 VITALS — BP 147/90 | HR 89 | Temp 97.8°F | Ht 64.0 in | Wt 149.2 lb

## 2023-05-21 DIAGNOSIS — M549 Dorsalgia, unspecified: Secondary | ICD-10-CM | POA: Diagnosis not present

## 2023-05-21 DIAGNOSIS — I6782 Cerebral ischemia: Secondary | ICD-10-CM | POA: Diagnosis not present

## 2023-05-21 DIAGNOSIS — R42 Dizziness and giddiness: Secondary | ICD-10-CM | POA: Diagnosis not present

## 2023-05-21 DIAGNOSIS — R519 Headache, unspecified: Secondary | ICD-10-CM | POA: Diagnosis not present

## 2023-05-21 DIAGNOSIS — M545 Low back pain, unspecified: Secondary | ICD-10-CM

## 2023-05-21 MED ORDER — BACLOFEN 10 MG PO TABS
10.0000 mg | ORAL_TABLET | Freq: Three times a day (TID) | ORAL | 0 refills | Status: DC
Start: 2023-05-21 — End: 2024-08-18

## 2023-05-21 MED ORDER — ONDANSETRON HCL 4 MG PO TABS: 4.0000 mg | ORAL_TABLET | Freq: Three times a day (TID) | ORAL | 0 refills | Status: DC | PRN

## 2023-05-21 MED ORDER — ATORVASTATIN CALCIUM 40 MG PO TABS: 40.0000 mg | ORAL_TABLET | Freq: Every day | ORAL | 3 refills | Status: DC

## 2023-05-21 MED ORDER — MELOXICAM 15 MG PO TABS
15.0000 mg | ORAL_TABLET | Freq: Every day | ORAL | 0 refills | Status: DC
Start: 2023-05-21 — End: 2023-06-18

## 2023-05-21 NOTE — Telephone Encounter (Signed)
Josie with [facility] called with a Call Report.  States she will fax Call Report to Dr. Jimmey Ralph Fax# [phone]

## 2023-05-21 NOTE — Patient Instructions (Signed)
It was very nice to see you today!  I think you probably have a concussion.   You probably have a spasm in your back as well.  Please start the Zofran, meloxicam, and baclofen.  We will check a scan and x-rays today to make sure there no fractures or bleeds.  It may take another 1 to 2 weeks before your concussion symptoms resolve.  Let us know if you have any worsening symptoms or no improvement over the next several days.  Return if symptoms worsen or fail to improve.   Take care, Dr Jimmey Ralph  PLEASE NOTE:  If you had any lab tests, please let us know if you have not heard back within a few days. You may see your results on mychart before we have a chance to review them but we will give you a call once they are reviewed by [place].   If we ordered any referrals today, please let us know if you have not heard from their office within the next week.   If you had any urgent prescriptions sent in today, please check with the pharmacy within an hour of our visit to make sure the prescription was transmitted appropriately.   Please try these tips to maintain a healthy lifestyle:  Eat at least 3 REAL meals and 1-2 snacks per day.  Aim for no more than 5 hours between eating.  If you eat breakfast, please do so within one hour of getting up.   Each meal should contain half fruits/vegetables, one quarter protein, and one quarter carbs (no bigger than a computer mouse)  Cut down on sweet beverages. This includes juice, soda, and sweet tea.   Drink at least 1 glass of water with each meal and aim for at least 8 glasses per day  Exercise at least 150 minutes every week.

## 2023-05-21 NOTE — Progress Notes (Signed)
Great news!  Her CT scan did not show any bleeds or other significant abnormalities.  She likely has a concussion as we discussed at her office visit.  This should gradually improve over the next 1 to 2 weeks.  She should let us know if symptoms do not improve or if she has any new symptoms.  Her x-rays did not show any fractures or other acute abnormalities either.  She likely has a muscle strain or spasm as we discussed at her office visit.  This should improve with the medications that we sent in.  She should let us know if not improving over the next several days.

## 2023-05-21 NOTE — Progress Notes (Signed)
   Lindsay Reyes is a 72 y.o. female who presents today for an office visit.  Assessment/Plan:  Headache Likely concussion related to her recent fall.  She does have an overall reassuring exam today however given her recent fall and persistence of symptoms we need to check CT scan to rule out bleed or other possible causes.  We will order stat CT scan.  Assuming CT scan is negative, anticipate that her concussion symptoms will continue to improve over the next 1 to 2 weeks.  We discussed reasons to return to care and seek emergent care  Back pain Also related to recent fall.  She does have a lot of tenderness on exam and likely has a muscular strain however due to trauma we will check plain films to rule out fracture.  We will treat with meloxicam and baclofen.  She can also use a heating pad.  If not improving may need referral to PT or sports medicine.     Subjective:  HPI:  See A/P for status of chronic conditions.  Patient is here today for follow up from a fall. This happened a week ago.  She was at home sitting on her bed.  She was taking off her socks when she tumbled forward off of her bed and fell.  Her head hit the adjacent nightstand and she became wedged between the nightstand and her bed.  Did not lose consciousness but did notice that everything went white for a few moments.  She was able to get free from the position however subsequently noticed head pain, back pain, sternal pain, and shoulder pain.  Symptoms were manageable initially however over the last several days she has had worsening pain as well as constant nausea and dizziness.  Some ringing in her ears.  She has not had any weakness or numbness.  No tingling.  No vomiting.  No vision changes.  Pain in her back is severe and worse with any motion.  She has tried taking some Vicodin that she had at home however this made her too nauseated.       Objective:  Physical Exam: BP (!) 147/90   Pulse 89   Temp 97.8 F (36.6 C)    Ht 5\' 4"  (1.626 m)   Wt 149 lb 3.2 oz (67.7 kg)   SpO2 98%   BMI 25.61 kg/m   Gen: No acute distress, resting comfortably HEENT: Extraocular movements intact.  TMs clear bilaterally CV: Regular rate and rhythm with no murmurs appreciated Pulm: Normal work of breathing, clear to auscultation bilaterally with no crackles, wheezes, or rhonchi MUSCULOSKELETAL: Back without deformities.  Tenderness palpation along lower thoracic and entire lumbar spine.  Neurovascular intact distally. Neuro: Cranial nerves II through XII intact.  Finger-nose-finger testing intact bilaterally. Psych: Normal affect and thought content      Rockell Faulks M. Jimmey Ralph, MD 05/21/2023 12:38 PM

## 2023-05-30 ENCOUNTER — Other Ambulatory Visit: Payer: Self-pay | Admitting: Family Medicine

## 2023-06-17 ENCOUNTER — Other Ambulatory Visit: Payer: Self-pay | Admitting: Family Medicine

## 2023-07-06 ENCOUNTER — Other Ambulatory Visit: Payer: Self-pay | Admitting: Family Medicine

## 2023-07-06 ENCOUNTER — Encounter: Payer: Self-pay | Admitting: Family Medicine

## 2023-07-06 ENCOUNTER — Ambulatory Visit (INDEPENDENT_AMBULATORY_CARE_PROVIDER_SITE_OTHER): Payer: Medicare HMO | Admitting: Family Medicine

## 2023-07-06 VITALS — BP 123/80 | HR 89 | Temp 97.5°F | Ht 64.0 in | Wt 145.0 lb

## 2023-07-06 DIAGNOSIS — G4700 Insomnia, unspecified: Secondary | ICD-10-CM | POA: Diagnosis not present

## 2023-07-06 DIAGNOSIS — E785 Hyperlipidemia, unspecified: Secondary | ICD-10-CM

## 2023-07-06 DIAGNOSIS — Z0001 Encounter for general adult medical examination with abnormal findings: Secondary | ICD-10-CM

## 2023-07-06 DIAGNOSIS — M4126 Other idiopathic scoliosis, lumbar region: Secondary | ICD-10-CM

## 2023-07-06 DIAGNOSIS — M545 Low back pain, unspecified: Secondary | ICD-10-CM | POA: Diagnosis not present

## 2023-07-06 DIAGNOSIS — R739 Hyperglycemia, unspecified: Secondary | ICD-10-CM | POA: Diagnosis not present

## 2023-07-06 DIAGNOSIS — G8929 Other chronic pain: Secondary | ICD-10-CM

## 2023-07-06 LAB — CBC
HCT: 43.7 % (ref 36.0–46.0)
Hemoglobin: 14.2 g/dL (ref 12.0–15.0)
MCHC: 32.5 g/dL (ref 30.0–36.0)
MCV: 92.2 fl (ref 78.0–100.0)
Platelets: 179 10*3/uL (ref 150.0–400.0)
RBC: 4.74 Mil/uL (ref 3.87–5.11)
RDW: 12.5 % (ref 11.5–15.5)
WBC: 3.6 10*3/uL — ABNORMAL LOW (ref 4.0–10.5)

## 2023-07-06 LAB — COMPREHENSIVE METABOLIC PANEL
ALT: 9 U/L (ref 0–35)
AST: 19 U/L (ref 0–37)
Albumin: 4.2 g/dL (ref 3.5–5.2)
Alkaline Phosphatase: 102 U/L (ref 39–117)
BUN: 16 mg/dL (ref 6–23)
CO2: 31 mEq/L (ref 19–32)
Calcium: 10.6 mg/dL — ABNORMAL HIGH (ref 8.4–10.5)
Chloride: 101 mEq/L (ref 96–112)
Creatinine, Ser: 0.98 mg/dL (ref 0.40–1.20)
GFR: 58.21 mL/min — ABNORMAL LOW (ref >60.00)
Glucose, Bld: 98 mg/dL (ref 70–99)
Potassium: 3.8 mEq/L (ref 3.5–5.1)
Sodium: 138 mEq/L (ref 135–145)
Total Bilirubin: 0.7 mg/dL (ref 0.2–1.2)
Total Protein: 7.1 g/dL (ref 6.0–8.3)

## 2023-07-06 LAB — LIPID PANEL
Cholesterol: 214 mg/dL — ABNORMAL HIGH (ref 0–200)
HDL: 52.2 mg/dL (ref >39.00)
LDL Cholesterol: 144 mg/dL — ABNORMAL HIGH (ref 0–99)
NonHDL: 161.32
Total CHOL/HDL Ratio: 4
Triglycerides: 87 mg/dL (ref 0.0–149.0)
VLDL: 17.4 mg/dL (ref 0.0–40.0)

## 2023-07-06 LAB — TSH: TSH: 4.65 u[IU]/mL (ref 0.35–5.50)

## 2023-07-06 LAB — HEMOGLOBIN A1C: Hgb A1c MFr Bld: 5.3 % (ref 4.6–6.5)

## 2023-07-06 NOTE — Assessment & Plan Note (Signed)
Stable on current right and Ambien 10 mg nightly and Seroquel 50 mg nightly.

## 2023-07-06 NOTE — Assessment & Plan Note (Signed)
Patient with chronic low back pain.  She has seen sports medicine and treated for this previously.  Also has had a ablation performed via physical medicine.  This worked well for a while however this does seem to be wearing off.  She would like to avoid narcotics if at all possible.  She is interested in seeing a pain specialist to discuss potential spinal cord stimulator.  Will place referral today.

## 2023-07-06 NOTE — Assessment & Plan Note (Signed)
Discussed last modifications.  Check lipids.

## 2023-07-06 NOTE — Assessment & Plan Note (Signed)
Check A1c. 

## 2023-07-06 NOTE — Progress Notes (Signed)
Chief Complaint:  Lindsay Reyes is a 72 y.o. female who presents today for her annual comprehensive physical exam.    Assessment/Plan:  Chronic Problems Addressed Today: LOW BACK PAIN, CHRONIC Patient with chronic low back pain.  She has seen sports medicine and treated for this previously.  Also has had a ablation performed via physical medicine.  This worked well for a while however this does seem to be wearing off.  She would like to avoid narcotics if at all possible.  She is interested in seeing a pain specialist to discuss potential spinal cord stimulator.  Will place referral today.  Insomnia Stable on current right and Ambien 10 mg nightly and Seroquel 50 mg nightly.  Dyslipidemia Discussed last modifications.  Check lipids.  Hyperglycemia Check A1c.  Preventative Healthcare: Check labs. UpToDate on cancer screening.   Patient Counseling(The following topics were reviewed and/or handout was given):  -Nutrition: Stressed importance of moderation in sodium/caffeine intake, saturated fat and cholesterol, caloric balance, sufficient intake of fresh fruits, vegetables, and fiber.  -Stressed the importance of regular exercise.   -Substance Abuse: Discussed cessation/primary prevention of tobacco, alcohol, or other drug use; driving or other dangerous activities under the influence; availability of treatment for abuse.   -Injury prevention: Discussed safety belts, safety helmets, smoke detector, smoking near bedding or upholstery.   -Sexuality: Discussed sexually transmitted diseases, partner selection, use of condoms, avoidance of unintended pregnancy and contraceptive alternatives.   -Dental health: Discussed importance of regular tooth brushing, flossing, and dental visits.  -Health maintenance and immunizations reviewed. Please refer to Health maintenance section.  Return to care in 1 year for next preventative visit.     Subjective:  HPI:  She has no acute complaints  today.   Lifestyle Diet: Balanced. Plenty of fruits and vegetabes.  Exercise: Limited due to back pain.      07/06/2023    1:18 PM  Depression screen PHQ 2/9  Decreased Interest 3  Down, Depressed, Hopeless 0  PHQ - 2 Score 3  Altered sleeping 0  Tired, decreased energy 1  Change in appetite 1  Feeling bad or failure about yourself  0  Trouble concentrating 0  Moving slowly or fidgety/restless 0  Suicidal thoughts 0  PHQ-9 Score 5    Health Maintenance Due  Topic Date Due   INFLUENZA VACCINE  Never done   COVID-19 Vaccine  Never done   Pneumonia Vaccine 53+ Years old  Never done     ROS: Per HPI, otherwise a complete review of systems was negative.   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   Osteopenia (last DEXA 2023) 05/03/2018   Allergic rhinitis 04/01/2018   Insomnia 03/01/2018   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   Urinary incontinence 01/18/2018   LOW BACK PAIN, CHRONIC 12/12/2007  DIVERTICULOSIS, COLON W/O HEM 08/25/2007   GERD 04/15/2007   Past Surgical History:  Procedure Laterality Date   ABDOMINAL HYSTERECTOMY  2002   COLONOSCOPY  2004   as of 10/19/22-pt unsure when had it done and not sure if she had polyps   UPPER GASTROINTESTINAL ENDOSCOPY  2021   2008 both with MS    Family History  Problem  Relation Age of Onset   Hypertension Mother    Diabetes Mother    Osteoporosis Mother    Heart Problems Mother    Colon polyps Sister    Breast cancer Sister    Colon cancer Sister    Thyroid cancer Sister    Esophageal cancer Neg Hx    Stomach cancer Neg Hx    Rectal cancer Neg Hx     Medications- reviewed and updated Current Outpatient Medications  Medication Sig Dispense Refill   atorvastatin (LIPITOR) 40 MG tablet Take 1 tablet (40 mg total) by mouth at bedtime. 90 tablet 3   baclofen (LIORESAL) 10 MG tablet Take 1 tablet (10 mg total) by mouth 3 (three) times daily. 30 each 0   Cholecalciferol (VITAMIN D3) 50 MCG (2000 UT) CAPS Take by mouth.     DULoxetine (CYMBALTA) 30 MG capsule TAKE 1 CAPSULE BY MOUTH TWICE A DAY 180 capsule 1   ibuprofen (ADVIL) 800 MG tablet Take 800 mg by mouth every 6 (six) hours as needed.     MELATONIN GUMMIES PO Take 40 mg by mouth at bedtime as needed. Takes 4 10 mg gummies     Multiple Vitamin (MULTIVITAMIN) tablet Take 1 tablet by mouth daily.     pantoprazole (PROTONIX) 40 MG tablet TAKE 1 TABLET (40 MG TOTAL) BY MOUTH 2 (TWO) TIMES DAILY BEFORE A MEAL. 180 tablet 3   QUEtiapine (SEROQUEL) 25 MG tablet TAKE 2 TABLETS BY MOUTH AT BEDTIME 180 tablet 0   zolpidem (AMBIEN) 10 MG tablet TAKE 1 TABLET BY MOUTH AT BEDTIME AS NEEDED FOR SLEEP. 30 tablet 5   No current facility-administered medications for this visit.    Allergies-reviewed and updated No Known Allergies  Social History   Socioeconomic History   Marital status: Single    Spouse name: Not on file   Number of children: Not on file   Years of education: Not on file   Highest education level: Not on file  Occupational History   Not on file  Tobacco Use   Smoking status: Never   Smokeless tobacco: Never  Vaping Use   Vaping status: Never Used  Substance and Sexual Activity   Alcohol use: Not Currently    Comment: socially, couple times a year   Drug use: Never   Sexual  activity: Not on file  Other Topics Concern   Not on file  Social History Narrative   Right handed   Retired    International aid/development worker of Corporate investment banker Strain: Low Risk  (05/11/2023)   Overall Financial Resource Strain (CARDIA)    Difficulty of Paying Living Expenses: Not hard at all  Food Insecurity: No Food Insecurity (05/11/2023)   Hunger Vital Sign    Worried About Running Out of Food in the Last Year: Never true    Ran Out of Food in the Last Year: Never true  Transportation Needs: No Transportation Needs (05/11/2023)   PRAPARE - Administrator, Civil Service (Medical): No    Lack of Transportation (Non-Medical): No  Physical Activity: Sufficiently Active (05/11/2023)  Exercise Vital Sign    Days of Exercise per Week: 7 days    Minutes of Exercise per Session: 30 min  Stress: No Stress Concern Present (05/11/2023)   [employer] of Occupational Health - Occupational Stress Questionnaire    Feeling of Stress : Not at all  Social Connections: Socially Isolated (05/11/2023)   Social Connection and Isolation Panel [NHANES]    Frequency of Communication with Friends and Family: Three times a week    Frequency of Social Gatherings with Friends and Family: Once a week    Attends Religious Services: Never    Database administrator or Organizations: No    Attends Engineer, structural: Never    Marital Status: Never married        Objective:  Physical Exam: BP 123/80   Pulse 89   Temp (!) 97.5 F (36.4 C) (Temporal)   Ht 5\' 4"  (1.626 m)   Wt 145 lb (65.8 kg)   SpO2 95%   BMI 24.89 kg/m   Body mass index is 24.89 kg/m. Wt Readings from Last 3 Encounters:  07/06/23 145 lb (65.8 kg)  05/21/23 149 lb 3.2 oz (67.7 kg)  05/11/23 150 lb (68 kg)   Gen: NAD, resting comfortably HEENT: TMs normal bilaterally. OP clear. No thyromegaly noted.  CV: RRR with no murmurs appreciated Pulm: NWOB, CTAB with no crackles, wheezes, or rhonchi GI: Normal bowel  sounds present. Soft, Nontender, Nondistended. MSK: no edema, cyanosis, or clubbing noted Skin: warm, dry Neuro: CN2-12 grossly intact. Strength 5/5 in upper and lower extremities. Reflexes symmetric and intact bilaterally.  Psych: Normal affect and thought content     Camdynn Maranto M. Jimmey Ralph, MD 07/06/2023 1:48 PM

## 2023-07-06 NOTE — Patient Instructions (Signed)
It was very nice to see you today!  I will refer you to see the pain specialists.   I will refill your medications and check blood work today.  Return in about 1 year (around 07/05/2024) for Annual Physical.   Take care, Dr Jimmey Ralph  PLEASE NOTE:  If you had any lab tests, please let us know if you have not heard back within a few days. You may see your results on mychart before we have a chance to review them but we will give you a call once they are reviewed by [place].   If we ordered any referrals today, please let us know if you have not heard from their office within the next week.   If you had any urgent prescriptions sent in today, please check with the pharmacy within an hour of our visit to make sure the prescription was transmitted appropriately.   Please try these tips to maintain a healthy lifestyle:  Eat at least 3 REAL meals and 1-2 snacks per day.  Aim for no more than 5 hours between eating.  If you eat breakfast, please do so within one hour of getting up.   Each meal should contain half fruits/vegetables, one quarter protein, and one quarter carbs (no bigger than a computer mouse)  Cut down on sweet beverages. This includes juice, soda, and sweet tea.   Drink at least 1 glass of water with each meal and aim for at least 8 glasses per day  Exercise at least 150 minutes every week.    Preventive Care 53 Years and Older, Female Preventive care refers to lifestyle choices and visits with your health care provider that can promote health and wellness. Preventive care visits are also called wellness exams. What can I expect for my preventive care visit? Counseling Your health care provider may ask you questions about your: Medical history, including: Past medical problems. Family medical history. Pregnancy and menstrual history. History of falls. Current health, including: Memory and ability to understand (cognition). Emotional well-being. Home life and relationship  well-being. Sexual activity and sexual health. Lifestyle, including: Alcohol, nicotine or tobacco, and drug use. Access to firearms. Diet, exercise, and sleep habits. Work and work Astronomer. Sunscreen use. Safety issues such as seatbelt and bike helmet use. Physical exam Your health care provider will check your: Height and weight. These may be used to calculate your BMI (body mass index). BMI is a measurement that tells if you are at a healthy weight. Waist circumference. This measures the distance around your waistline. This measurement also tells if you are at a healthy weight and may help predict your risk of certain diseases, such as type 2 diabetes and high blood pressure. Heart rate and blood pressure. Body temperature. Skin for abnormal spots. What immunizations do I need?  Vaccines are usually given at various ages, according to a schedule. Your health care provider will recommend vaccines for you based on your age, medical history, and lifestyle or other factors, such as travel or where you work. What tests do I need? Screening Your health care provider may recommend screening tests for certain conditions. This may include: Lipid and cholesterol levels. Hepatitis C test. Hepatitis B test. HIV (human immunodeficiency virus) test. STI (sexually transmitted infection) testing, if you are at risk. Lung cancer screening. Colorectal cancer screening. Diabetes screening. This is done by checking your blood sugar (glucose) after you have not eaten for a while (fasting). Mammogram. Talk with your health care provider about how often  you should have regular mammograms. BRCA-related cancer screening. This may be done if you have a family history of breast, ovarian, tubal, or peritoneal cancers. Bone density scan. This is done to screen for osteoporosis. Talk with your health care provider about your test results, treatment options, and if necessary, the need for more tests. Follow  these instructions at home: Eating and drinking  Eat a diet that includes fresh fruits and vegetables, whole grains, lean protein, and low-fat dairy products. Limit your intake of foods with high amounts of sugar, saturated fats, and salt. Take vitamin and mineral supplements as recommended by your health care provider. Do not drink alcohol if your health care provider tells you not to drink. If you drink alcohol: Limit how much you have to 0-1 drink a day. Know how much alcohol is in your drink. In the [place] one drink equals one 12 oz bottle of beer (355 mL), one 5 oz glass of wine (148 mL), or one 1 oz glass of hard liquor (44 mL). Lifestyle Brush your teeth every morning and night with fluoride toothpaste. Floss one time each day. Exercise for at least 30 minutes 5 or more days each week. Do not use any products that contain nicotine or tobacco. These products include cigarettes, chewing tobacco, and vaping devices, such as e-cigarettes. If you need help quitting, ask your health care provider. Do not use drugs. If you are sexually active, practice safe sex. Use a condom or other form of protection in order to prevent STIs. Take aspirin only as told by your health care provider. Make sure that you understand how much to take and what form to take. Work with your health care provider to find out whether it is safe and beneficial for you to take aspirin daily. Ask your health care provider if you need to take a cholesterol-lowering medicine (statin). Find healthy ways to manage stress, such as: Meditation, yoga, or listening to music. Journaling. Talking to a trusted person. Spending time with friends and family. Minimize exposure to UV radiation to reduce your risk of skin cancer. Safety Always wear your seat belt while driving or riding in a vehicle. Do not drive: If you have been drinking alcohol. Do not ride with someone who has been drinking. When you are tired or distracted. While  texting. If you have been using any mind-altering substances or drugs. Wear a helmet and other protective equipment during sports activities. If you have firearms in your house, make sure you follow all gun safety procedures. What's next? Visit your health care provider once a year for an annual wellness visit. Ask your health care provider how often you should have your eyes and teeth checked. Stay up to date on all vaccines. This information is not intended to replace advice given to you by your health care provider. Make sure you discuss any questions you have with your health care provider. Document Revised: 05/21/2021 Document Reviewed: 05/21/2021 Elsevier Patient Education  2024 [employer].

## 2023-07-07 ENCOUNTER — Other Ambulatory Visit: Payer: Self-pay | Admitting: *Deleted

## 2023-07-07 MED ORDER — ATORVASTATIN CALCIUM 40 MG PO TABS: 40.0000 mg | ORAL_TABLET | Freq: Every day | ORAL | Status: DC

## 2023-07-07 NOTE — Progress Notes (Signed)
Her cholesterol is elevated but about the same as last year.  She may benefit from starting cholesterol medication to improve numbers and lower risk of heart attack and stroke.  Please send in Lipitor 20 mg daily if she is agreeable to start.  The rest of her labs are all stable.  She should continue to work on diet and exercise and we can recheck everything in a year.

## 2023-07-29 ENCOUNTER — Telehealth: Payer: Self-pay | Admitting: Family Medicine

## 2023-07-29 NOTE — Telephone Encounter (Signed)
Prescription Request  Patient requests the following medication be 20 MG   07/29/2023  LOV: 07/06/2023  What is the name of the medication or equipment? atorvastatin (LIPITOR)  (20 MG Per Patient)  Have you contacted your pharmacy to request a refill? Yes   Which pharmacy would you like this sent to?  [id number] [place],  - [address] [place] [address] Phone: [phone] Fax: [phone]    Patient notified that their request is being sent to the clinical staff for review and that they should receive a response within 2 business days.   Please advise at Mobile [phone] (mobile)

## 2023-07-30 ENCOUNTER — Other Ambulatory Visit: Payer: Self-pay

## 2023-07-30 MED ORDER — ATORVASTATIN CALCIUM 40 MG PO TABS: 40.0000 mg | ORAL_TABLET | Freq: Every day | ORAL | Status: DC

## 2023-07-30 NOTE — Telephone Encounter (Signed)
Rx sent 

## 2023-08-03 ENCOUNTER — Other Ambulatory Visit: Payer: Self-pay | Admitting: *Deleted

## 2023-08-03 MED ORDER — ATORVASTATIN CALCIUM 20 MG PO TABS: 20.0000 mg | ORAL_TABLET | Freq: Every day | ORAL | 1 refills | Status: DC

## 2023-08-03 NOTE — Telephone Encounter (Signed)
New Rx send to pharmacy, patient aware

## 2023-08-03 NOTE — Telephone Encounter (Signed)
Patient called requesting that 20 mg of lipitor be sent in since that is what she's taking. States she doesn't want to have to cut the pills in half.

## 2023-08-28 ENCOUNTER — Other Ambulatory Visit: Payer: Self-pay | Admitting: Family Medicine

## 2023-09-21 DIAGNOSIS — G8929 Other chronic pain: Secondary | ICD-10-CM | POA: Diagnosis not present

## 2023-09-21 DIAGNOSIS — M545 Low back pain, unspecified: Secondary | ICD-10-CM | POA: Diagnosis not present

## 2023-10-19 DIAGNOSIS — M47816 Spondylosis without myelopathy or radiculopathy, lumbar region: Secondary | ICD-10-CM | POA: Diagnosis not present

## 2023-10-19 DIAGNOSIS — G8929 Other chronic pain: Secondary | ICD-10-CM | POA: Diagnosis not present

## 2023-10-19 DIAGNOSIS — M47814 Spondylosis without myelopathy or radiculopathy, thoracic region: Secondary | ICD-10-CM | POA: Diagnosis not present

## 2023-10-19 DIAGNOSIS — M545 Low back pain, unspecified: Secondary | ICD-10-CM | POA: Diagnosis not present

## 2023-11-28 ENCOUNTER — Other Ambulatory Visit: Payer: Self-pay | Admitting: Family Medicine

## 2023-12-02 ENCOUNTER — Other Ambulatory Visit: Payer: Self-pay | Admitting: Physical Medicine and Rehabilitation

## 2023-12-02 MED ORDER — DULOXETINE HCL 30 MG PO CPEP: 30.0000 mg | ORAL_CAPSULE | Freq: Two times a day (BID) | ORAL | 1 refills | Status: DC

## 2023-12-02 NOTE — Addendum Note (Signed)
Addended by: Ashok Norris on: 12/02/2023 11:33 AM   Modules accepted: Orders

## 2023-12-16 ENCOUNTER — Ambulatory Visit: Payer: Medicare HMO | Admitting: Family Medicine

## 2023-12-30 ENCOUNTER — Other Ambulatory Visit: Payer: Self-pay | Admitting: Family Medicine

## 2024-01-26 ENCOUNTER — Other Ambulatory Visit: Payer: Self-pay | Admitting: Family Medicine

## 2024-01-28 ENCOUNTER — Other Ambulatory Visit: Payer: Self-pay | Admitting: Family Medicine

## 2024-02-14 DIAGNOSIS — Z1231 Encounter for screening mammogram for malignant neoplasm of breast: Secondary | ICD-10-CM | POA: Diagnosis not present

## 2024-02-14 LAB — HM MAMMOGRAPHY

## 2024-02-17 ENCOUNTER — Encounter: Payer: Self-pay | Admitting: Family Medicine

## 2024-02-26 ENCOUNTER — Other Ambulatory Visit: Payer: Self-pay | Admitting: Family Medicine

## 2024-04-06 ENCOUNTER — Ambulatory Visit: Payer: Medicare HMO

## 2024-04-06 VITALS — Ht 63.0 in | Wt 145.0 lb

## 2024-04-06 DIAGNOSIS — Z0000 Encounter for general adult medical examination without abnormal findings: Secondary | ICD-10-CM

## 2024-04-06 NOTE — Patient Instructions (Signed)
 Lindsay Reyes , Thank you for taking time to come for your Medicare Wellness Visit. I appreciate your ongoing commitment to your health goals. Please review the following plan we discussed and let me know if I can assist you in the future.   Referrals/Orders/Follow-Ups/Clinician Recommendations: maintain health and activity  This is a list of the screening recommended for you and due dates:  Health Maintenance  Topic Date Due   DEXA scan (bone density measurement)  05/05/2024   DTaP/Tdap/Td vaccine (4 - Td or Tdap) 05/23/2024   Medicare Annual Wellness Visit  04/06/2025   Colon Cancer Screening  11/18/2027   Hepatitis C Screening  Completed   Zoster (Shingles) Vaccine  Completed   HPV Vaccine  Aged Out   Meningitis B Vaccine  Aged Out   Pneumonia Vaccine  Discontinued   Flu Shot  Discontinued   Mammogram  Discontinued   COVID-19 Vaccine  Discontinued    Advanced directives: (In Chart) A copy of your advanced directives are scanned into your chart should your provider ever need it.  Next Medicare Annual Wellness Visit scheduled for next year: Yes

## 2024-04-06 NOTE — Progress Notes (Signed)
 Subjective:   Lindsay Reyes is a 73 y.o. who presents for a Medicare Wellness preventive visit.  Visit Complete: Virtual I connected with  Lindsay Reyes on 04/06/24 by a audio enabled telemedicine application and verified that I am speaking with the correct person using two identifiers.  Patient Location: Home  Provider Location: Office/Clinic  I discussed the limitations of evaluation and management by telemedicine. The patient expressed understanding and agreed to proceed.  Vital Signs: Because this visit was a virtual/telehealth visit, some criteria may be missing or patient reported. Any vitals not documented were not able to be obtained and vitals that have been documented are patient reported.  VideoDeclined- This patient declined Librarian, academic. Therefore the visit was completed with audio only.  Persons Participating in Visit: Patient.  AWV Questionnaire: No: Patient Medicare AWV questionnaire was not completed prior to this visit.  Cardiac Risk Factors include: advanced age (>74men, >19 women);dyslipidemia     Objective:    Today's Vitals   04/06/24 1305  Weight: 145 lb (65.8 kg)  Height: 5\' 3"  (1.6 m)   Body mass index is 25.69 kg/m.     04/06/2024    1:09 PM 05/11/2023    1:34 PM 09/21/2022    1:08 PM 03/04/2022   12:03 PM 02/20/2022    2:25 PM 02/13/2021   11:53 AM 11/26/2020    3:17 PM  Advanced Directives  Does Patient Have a Medical Advance Directive? Yes Yes Yes Yes Yes No No  Type of Estate agent of [place];Living will Healthcare Power of [place];Living will Living will Healthcare Power of [employer] Power of [employer] Power of Attorney   Does patient want to make changes to medical advance directive? No - Patient declined No - Patient declined    No - Patient declined   Copy of Healthcare Power of Attorney in Chart? Yes - validated most recent copy scanned in chart (See row  information) Yes - validated most recent copy scanned in chart (See row information)  No - copy requested Yes - validated most recent copy scanned in chart (See row information)      Current Medications (verified) Outpatient Encounter Medications as of 04/06/2024  Medication Sig   atorvastatin  (LIPITOR) 20 MG tablet TAKE 1 TABLET BY MOUTH EVERY DAY   baclofen  (LIORESAL ) 10 MG tablet Take 1 tablet (10 mg total) by mouth 3 (three) times daily.   celecoxib  (CELEBREX ) 200 MG capsule Oral for 30 Days   Cholecalciferol (VITAMIN D3) 50 MCG (2000 UT) CAPS Take by mouth.   DULoxetine  (CYMBALTA ) 30 MG capsule Take 1 capsule (30 mg total) by mouth 2 (two) times daily.   ibuprofen  (ADVIL ) 800 MG tablet Take 800 mg by mouth every 6 (six) hours as needed.   MELATONIN GUMMIES PO Take 40 mg by mouth at bedtime as needed. Takes 4 10 mg gummies   Multiple Vitamin (MULTIVITAMIN) tablet Take 1 tablet by mouth daily.   pantoprazole  (PROTONIX ) 40 MG tablet TAKE 1 TABLET (40 MG TOTAL) BY MOUTH TWICE A DAY BEFORE MEALS   QUEtiapine  (SEROQUEL ) 25 MG tablet TAKE 2 TABLETS BY MOUTH AT BEDTIME   zolpidem  (AMBIEN ) 10 MG tablet TAKE 1 TABLET BY MOUTH AT BEDTIME AS NEEDED FOR SLEEP   No facility-administered encounter medications on file as of 04/06/2024.    Allergies (verified) Patient has no known allergies.   History: Past Medical History:  Diagnosis Date   Arthritis    Colon polyp  Depression    Diverticulosis    GERD (gastroesophageal reflux disease)    Hyperlipidemia    Insomnia    Retinal micro-aneurysm of right eye    Scoliosis    Past Surgical History:  Procedure Laterality Date   ABDOMINAL HYSTERECTOMY  2002   COLONOSCOPY  2004   as of 10/19/22-pt unsure when had it done and not sure if she had polyps   UPPER GASTROINTESTINAL ENDOSCOPY  2021   2008 both with MS   Family History  Problem Relation Age of Onset   Hypertension Mother    Diabetes Mother    Osteoporosis Mother    Heart Problems  Mother    Colon polyps Sister    Breast cancer Sister    Colon cancer Sister    Thyroid  cancer Sister    Esophageal cancer Neg Hx    Stomach cancer Neg Hx    Rectal cancer Neg Hx    Social History   Socioeconomic History   Marital status: Single    Spouse name: Not on file   Number of children: Not on file   Years of education: Not on file   Highest education level: Not on file  Occupational History   Not on file  Tobacco Use   Smoking status: Never   Smokeless tobacco: Never  Vaping Use   Vaping status: Never Used  Substance and Sexual Activity   Alcohol use: Not Currently    Comment: socially, couple times a year   Drug use: Never   Sexual activity: Not on file  Other Topics Concern   Not on file  Social History Narrative   Right handed   Retired    Chief Executive Officer Drivers of Corporate investment banker Strain: Low Risk  (04/06/2024)   Overall Financial Resource Strain (CARDIA)    Difficulty of Paying Living Expenses: Not hard at all  Food Insecurity: No Food Insecurity (04/06/2024)   Hunger Vital Sign    Worried About Running Out of Food in the Last Year: Never true    Ran Out of Food in the Last Year: Never true  Transportation Needs: No Transportation Needs (04/06/2024)   PRAPARE - Administrator, Civil Service (Medical): No    Lack of Transportation (Non-Medical): No  Physical Activity: Sufficiently Active (04/06/2024)   Exercise Vital Sign    Days of Exercise per Week: 5 days    Minutes of Exercise per Session: 30 min  Stress: No Stress Concern Present (04/06/2024)   [employer] of Occupational Health - Occupational Stress Questionnaire    Feeling of Stress : Not at all  Social Connections: Socially Isolated (04/06/2024)   Social Connection and Isolation Panel [NHANES]    Frequency of Communication with Friends and Family: Twice a week    Frequency of Social Gatherings with Friends and Family: Twice a week    Attends Religious Services: Never    Automotive engineer or Organizations: No    Attends Engineer, structural: Never    Marital Status: Never married    Tobacco Counseling Counseling given: Not Answered    Clinical Intake:  Pre-visit preparation completed: Yes  Pain : No/denies pain     BMI - recorded: 25.69 Nutritional Status: BMI 25 -29 Overweight Nutritional Risks: None Diabetes: No  Lab Results  Component Value Date   HGBA1C 5.3 07/06/2023   HGBA1C 5.4 04/25/2020   HGBA1C 5.3 09/29/2018     How often do you need to have  someone help you when you read instructions, pamphlets, or other written materials from your doctor or pharmacy?: 1 - Never  Interpreter Needed?: No  Information entered by :: Lamont Pilsner, LPN   Activities of Daily Living     04/06/2024    1:06 PM 05/11/2023    1:35 PM  In your present state of health, do you have any difficulty performing the following activities:  Hearing? 0 0  Vision? 0 0  Difficulty concentrating or making decisions? 0 0  Walking or climbing stairs? 0 0  Dressing or bathing? 0 0  Doing errands, shopping? 0 0  Preparing Food and eating ? N N  Using the Toilet? N N  In the past six months, have you accidently leaked urine? Colie Dawes  Comment wears a pad at times  Do you have problems with loss of bowel control? N N  Managing your Medications? N N  Managing your Finances? N N  Housekeeping or managing your Housekeeping? N N    Patient Care Team: Rodney Clamp, MD as PCP - General (Family Medicine) Asencion Blacksmith, MD (Inactive) as Consulting Physician (Gastroenterology) Clyde Darling, DO as Consulting Physician (Sports Medicine) Lannis Plain, OD (Optometry)  Indicate any recent Medical Services you may have received from other than [facility] providers in the past year (date may be approximate).     Assessment:   This is a routine wellness examination for Lindsay Reyes.  Hearing/Vision screen Hearing Screening - Comments:: Pt denies any hearing issues   Vision Screening - Comments:: Pt stated when she follow up it is with Dr Daughtery    Goals Addressed             This Visit's Progress    Patient Stated       Maintain health and activity        Depression Screen     04/06/2024    1:08 PM 07/06/2023    1:18 PM 05/21/2023   11:25 AM 05/11/2023    1:33 PM 05/04/2023    1:09 PM 04/22/2023   10:20 AM 09/14/2022    1:26 PM  PHQ 2/9 Scores  PHQ - 2 Score 0 3 0 0 0 0 0  PHQ- 9 Score 0 5         Fall Risk     04/06/2024    1:12 PM 07/06/2023    1:18 PM 05/21/2023   11:25 AM 05/11/2023    1:35 PM 05/04/2023    1:09 PM  Fall Risk   Falls in the past year? 0 0 1 0 0  Number falls in past yr: 0 0 0 0 0  Injury with Fall? 0 0 0 0 0  Risk for fall due to : No Fall Risks No Fall Risks  Impaired vision No Fall Risks  Follow up Falls prevention discussed   Falls prevention discussed     MEDICARE RISK AT HOME:  Medicare Risk at Home Any stairs in or around the home?: No If so, are there any without handrails?: No Home free of loose throw rugs in walkways, pet beds, electrical cords, etc?: Yes Adequate lighting in your home to reduce risk of falls?: Yes Life alert?: No Use of a cane, walker or w/c?: No Grab bars in the bathroom?: Yes Shower chair or bench in shower?: No Elevated toilet seat or a handicapped toilet?: Yes  TIMED UP AND GO:  Was the test performed?  No  Cognitive Function: 6CIT completed  04/06/2024    1:12 PM 05/11/2023    1:36 PM 01/15/2020    1:31 PM  6CIT Screen  What Year? 0 points 0 points 0 points  What month? 0 points 0 points 0 points  What time? 0 points 0 points 0 points  Count back from 20 0 points 0 points 0 points  Months in reverse 4 points 4 points 0 points  Repeat phrase 0 points 0 points 0 points  Total Score 4 points 4 points 0 points    Immunizations Immunization History  Administered Date(s) Administered   Td 12/08/2003   Td (Adult),5 Lf Tetanus Toxid, Preservative Free 12/08/2003    Tdap 05/23/2014   Zoster Recombinant(Shingrix) 12/26/2019, 03/25/2020    Screening Tests Health Maintenance  Topic Date Due   DEXA SCAN  05/05/2024   DTaP/Tdap/Td (4 - Td or Tdap) 05/23/2024   Medicare Annual Wellness (AWV)  04/06/2025   Colonoscopy  11/18/2027   Hepatitis C Screening  Completed   Zoster Vaccines- Shingrix  Completed   HPV VACCINES  Aged Out   Meningococcal B Vaccine  Aged Out   Pneumonia Vaccine 56+ Years old  Discontinued   INFLUENZA VACCINE  Discontinued   MAMMOGRAM  Discontinued   COVID-19 Vaccine  Discontinued    Health Maintenance  There are no preventive care reminders to display for this patient.  Health Maintenance Items Addressed: See Nurse Notes  Additional Screening:  Vision Screening: Recommended annual ophthalmology exams for early detection of glaucoma and other disorders of the eye.  Dental Screening: Recommended annual dental exams for proper oral hygiene  Community Resource Referral / Chronic Care Management: CRR required this visit?  No   CCM required this visit?  No     Plan:     I have personally reviewed and noted the following in the patient's chart:   Medical and social history Use of alcohol, tobacco or illicit drugs  Current medications and supplements including opioid prescriptions. Patient is not currently taking opioid prescriptions. Functional ability and status Nutritional status Physical activity Advanced directives List of other physicians Hospitalizations, surgeries, and ER visits in previous 12 months Vitals Screenings to include cognitive, depression, and falls Referrals and appointments  In addition, I have reviewed and discussed with patient certain preventive protocols, quality metrics, and best practice recommendations. A written personalized care plan for preventive services as well as general preventive health recommendations were provided to patient.     Bruno Capri, LPN   12/12/1094    After Visit Summary: (Declined) Due to this being a telephonic visit, with patients personalized plan was offered to patient but patient Declined AVS at this time   Notes: Nothing significant to report at this time.

## 2024-04-24 ENCOUNTER — Telehealth: Payer: Self-pay | Admitting: *Deleted

## 2024-04-24 ENCOUNTER — Other Ambulatory Visit: Payer: Self-pay | Admitting: *Deleted

## 2024-04-24 DIAGNOSIS — M8580 Other specified disorders of bone density and structure, unspecified site: Secondary | ICD-10-CM

## 2024-04-24 NOTE — Telephone Encounter (Signed)
 Copied from CRM [id number]. Topic: Clinical - Request for Lab/Test Order >> Apr 24, 2024  3:09 PM Magdalene School wrote: Reason for CRM: Patient requesting Bone Density order to the same place she went before. Last one was on 05/05/2022. She was unsure of the name of the place but stated that Dr. Daneil Dunker would know. Patient is requesting a call when order is placed so she can call and schedule.   Order placed  [facility]

## 2024-05-09 DIAGNOSIS — H524 Presbyopia: Secondary | ICD-10-CM | POA: Diagnosis not present

## 2024-05-15 ENCOUNTER — Ambulatory Visit
Admission: RE | Admit: 2024-05-15 | Discharge: 2024-05-15 | Disposition: A | Discharge status: Home / Self Care | Source: Ambulatory Visit | Attending: Family Medicine | Admitting: Family Medicine

## 2024-05-15 DIAGNOSIS — M8580 Other specified disorders of bone density and structure, unspecified site: Secondary | ICD-10-CM

## 2024-05-22 ENCOUNTER — Ambulatory Visit: Payer: Self-pay | Admitting: Family Medicine

## 2024-05-22 DIAGNOSIS — M8580 Other specified disorders of bone density and structure, unspecified site: Secondary | ICD-10-CM

## 2024-05-22 NOTE — Progress Notes (Signed)
 Her bone density scan shows slight worsening osteopenia.  She may benefit from medication to improve the strength of her bones.  Recommend referral to osteoporosis clinic for further evaluation if she is agreeable.

## 2024-05-25 ENCOUNTER — Telehealth: Payer: Self-pay

## 2024-05-25 NOTE — Telephone Encounter (Signed)
 Cymbalta  30 MG   Qty 180 Tablets  Take 1 capsule (30 mg total) by mouth 2 (two) times daily.   PATIENT USES [facility] [place] ON [address]  Patient states she needs this refill until she sees her PCP at the end of July

## 2024-05-25 NOTE — Telephone Encounter (Signed)
 She will get her PCP to take over refills then

## 2024-05-26 ENCOUNTER — Other Ambulatory Visit: Payer: Self-pay | Admitting: Physical Medicine and Rehabilitation

## 2024-05-26 ENCOUNTER — Other Ambulatory Visit: Payer: Self-pay | Admitting: Family Medicine

## 2024-05-26 MED ORDER — DULOXETINE HCL 30 MG PO CPEP: 30.0000 mg | ORAL_CAPSULE | Freq: Two times a day (BID) | ORAL | 1 refills | Status: DC

## 2024-06-19 NOTE — Telephone Encounter (Unsigned)
 Copied from CRM [id number]. Topic: Clinical - Medication Refill >> Jun 19, 2024  4:07 PM Paige D wrote: Medication: Zolpidem  10MG   And Quetiapine  25 MG.   Has the patient contacted their pharmacy? No (Agent: If no, request that the patient contact the pharmacy for the refill. If patient does not wish to contact the pharmacy document the reason why and proceed with request.) (Agent: If yes, when and what did the pharmacy advise?)  This is the patient's preferred pharmacy:  [id number] [place] [place], [place] - [address] [place] [address] Phone: [phone] Fax: [phone]  Is this the correct pharmacy for this prescription? Yes If no, delete pharmacy and type the correct one.   Has the prescription been filled recently? Yes  Is the patient out of the medication? Yes  Has the patient been seen for an appointment in the last year OR does the patient have an upcoming appointment? Yes  Can we respond through MyChart? No  Agent: Please be advised that Rx refills may take up to 3 business days. We ask that you follow-up with your pharmacy.

## 2024-06-23 ENCOUNTER — Other Ambulatory Visit: Payer: Self-pay | Admitting: Family Medicine

## 2024-06-23 NOTE — Telephone Encounter (Signed)
 Please review refill request sent by patient.

## 2024-06-26 ENCOUNTER — Other Ambulatory Visit: Payer: Self-pay | Admitting: Physician Assistant

## 2024-06-26 ENCOUNTER — Ambulatory Visit (INDEPENDENT_AMBULATORY_CARE_PROVIDER_SITE_OTHER): Admitting: Physician Assistant

## 2024-06-26 ENCOUNTER — Encounter: Payer: Self-pay | Admitting: Physician Assistant

## 2024-06-26 ENCOUNTER — Other Ambulatory Visit: Payer: Self-pay

## 2024-06-26 DIAGNOSIS — M810 Age-related osteoporosis without current pathological fracture: Secondary | ICD-10-CM | POA: Insufficient documentation

## 2024-06-26 DIAGNOSIS — M25512 Pain in left shoulder: Secondary | ICD-10-CM | POA: Diagnosis not present

## 2024-06-26 MED ORDER — METHYLPREDNISOLONE ACETATE 40 MG/ML IJ SUSP
40.0000 mg | INTRAMUSCULAR | Status: AC | PRN
  Administered 2024-06-26: 40 mg via INTRA_ARTICULAR

## 2024-06-26 MED ORDER — LIDOCAINE HCL 1 % IJ SOLN
5.0000 mL | INTRAMUSCULAR | Status: AC | PRN
  Administered 2024-06-26: 5 mL

## 2024-06-26 NOTE — Progress Notes (Signed)
 Office Visit Note   Patient: Lindsay Reyes           Date of Birth: 17-Oct-1951           MRN: [id number] Visit Date: 06/26/2024              Requested by: Kennyth Worth HERO, MD [address] [place],  [place] [address] PCP: Kennyth Worth HERO, MD   Assessment & Plan: Visit Diagnoses:  1. Age-related osteoporosis without current pathological fracture     Plan: Patient is a pleasant 73 year old woman who is referred by Dr. Kennyth for evaluation of osteoporosis.  She has never been treated for osteoporosis in the past.  She has no history of fragility fractures.  No history of heart disease or cancer.  No history of kidney disease no history of ulcers she does have severe reflux.  No history of epilepsy.  She did undergo a hysterectomy in 2002 when she was in her 98s without any hormone replacement.  She is not currently taking any calcium  pills and she admits she is not necessarily a good eater.  She takes 5000 units of vitamin D a day.  She does not drink and she tries to do some stretching every night.  She has a significant history of osteoporosis in her mother who had several vertebral fractures.  Based on her bone density scan which is -2.4 and her history she is at a 25% risk for major osteoporotic fracture and 11% risk for a hip fracture.  I discussed with her both Evenity and Prolia we discussed side effects treatment options.  We also discussed in length think she can do lifestyle changes as well as tracking her calcium .  Spent over 45 minutes.  With her.  She also requested to be evaluated for left shoulder pain she notices this been significantly bothering her especially with overhead activities findings mostly consistent with rotator cuff tendinitis can consider an injection today  Follow-Up Instructions: After bone density scan will discuss  Orders:  No orders of the defined types were placed in this encounter.  No orders of the defined types were placed in this encounter.      Procedures: Large Joint Inj: L subacromial bursa on 06/26/2024 11:16 AM Indications: diagnostic evaluation and pain Details: 25 G 1.5 in needle, posterior approach  Arthrogram: No  Medications: 5 mL lidocaine  1 %; 40 mg methylPREDNISolone  acetate 40 MG/ML Outcome: tolerated well, no immediate complications Procedure, treatment alternatives, risks and benefits explained, specific risks discussed. Consent was given by the patient.      Clinical Data: No additional findings.   Subjective: No chief complaint on file.   HPI patient is a 73 year old woman who is referred today for evaluation of osteoporosis by Dr. Kennyth  Review of Systems  All other systems reviewed and are negative.    Objective: Vital Signs: There were no vitals taken for this visit.  Physical Exam Constitutional:      Appearance: Normal appearance.  Skin:    General: Skin is warm and dry.  Neurological:     General: No focal deficit present.     Mental Status: She is alert and oriented to person, place, and time.  Psychiatric:        Mood and Affect: Mood normal.        Behavior: Behavior normal.     Ortho Exam Left shoulder she has pain with overhead activities some pain with internal rotation behind her back no pain with external rotation  she has good strength with resisted abduction external and internal rotation.  She has a positive empty can test negative speeds test Specialty Comments:  EXAM: MRI LUMBAR SPINE WITHOUT CONTRAST   TECHNIQUE: Multiplanar, multisequence MR imaging of the lumbar spine was performed. No intravenous contrast was administered.   COMPARISON:  Lumbar radiographs Apr 10, 2021.   FINDINGS: Segmentation: 5 non rib-bearing lumbar vertebral bodies on prior radiographs.   Alignment:  Moderate Levocurvature centered at L2-L3.   Vertebrae: Scattered T1 hyperintense lesions throughout the thoracolumbar spine and sacrum, compatible with benign vertebral venous  malformations. Schmorl's node involving the inferior T12 endplate. Degenerative/discogenic endplate signal changes about the L2-L3 and L5-S1 discs. No specific evidence of acute fracture or discitis/osteomyelitis.   Conus medullaris and cauda equina: Conus extends to the L1 level. Conus appears normal.   Paraspinal and other soft tissues: Bilateral renal cysts. Paraspinal muscular atrophy. Mild right L4-L5 inflammatory perifacet edema.   Disc levels:   T12-L1: No significant disc protrusion, foraminal stenosis, or canal stenosis.   L1-L2: Slight disc bulging without significant canal or foraminal stenosis.   L2-L3: Mild right a centric disc bulge and mild right facet arthropathy without significant canal or foraminal stenosis.   L3-L4: Disc height loss, eccentric to the right. Small left foraminal disc protrusion. Severe right and mild left facet arthropathy without significant canal or foraminal stenosis.   L4-L5: Moderate bilateral facet arthropathy. Mild disc bulging. Resulting mild left foraminal stenosis without significant canal or right foraminal stenosis.   L5-S1: Disc height loss. Mild bilateral facet arthropathy without significant canal or foraminal stenosis.   IMPRESSION: 1. Mild left foraminal stenosis at L4-L5. No significant canal stenosis. 2. Multilevel facet arthropathy, severe on the right at L4-L5 with mild perifacet edema. 3. Moderate Levocurvature centered at L2-L3 with multilevel degenerative disease.     Electronically Signed   By: Gilmore GORMAN Molt M.D.   On: 09/25/2021 16:20  Imaging: No results found.   PMFS History: Patient Active Problem List   Diagnosis Date Noted  . Age-related osteoporosis without current pathological fracture 06/26/2024  . Other idiopathic scoliosis, lumbar region 09/08/2021  . Pruritus 02/28/2021  . Movement disorder 08/26/2020  . Stress 06/11/2020  . Dyslipidemia 04/30/2020  . Hyperglycemia 04/30/2020  .  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 Pelvic pain 08/10/2019  . Rhinorrhea 02/16/2019  . Sleep-disordered breathing 02/16/2019  . 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  . Osteopenia (last DEXA 2023) 05/03/2018  . Allergic rhinitis 04/01/2018  . Insomnia 03/01/2018  . 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 Urinary incontinence 01/18/2018  . LOW BACK PAIN, CHRONIC 12/12/2007  . Diverticulosis of colon 08/25/2007  . GERD 04/15/2007   Past Medical History:  Diagnosis Date  . Arthritis   . Colon polyp   . Depression   . Diverticulosis   . GERD (gastroesophageal reflux disease)   . Hyperlipidemia   . Insomnia   . Retinal micro-aneurysm of right eye   . Scoliosis     Family History  Problem Relation Age of Onset  . Hypertension Mother   . Diabetes Mother   . Osteoporosis Mother   . Heart Problems Mother   . Colon polyps Sister   . Breast cancer Sister   . Colon cancer Sister   . Thyroid  cancer Sister   . Esophageal cancer Neg Hx   . Stomach cancer Neg Hx   . Rectal cancer Neg Hx     Past Surgical History:  Procedure Laterality Date  . ABDOMINAL HYSTERECTOMY  2002  . COLONOSCOPY  2004   as of 10/19/22-pt unsure when had it done and not sure if she had polyps  . UPPER GASTROINTESTINAL ENDOSCOPY  2021   2008 both with MS   Social History   Occupational History  . Not on file  Tobacco Use  . Smoking status: Never  . Smokeless tobacco: Never  Vaping Use  . Vaping status: Never Used  Substance and Sexual Activity  . Alcohol use: Not Currently    Comment: socially, couple times a year  . Drug use: Never  . Sexual activity: Not on file

## 2024-06-26 NOTE — Addendum Note (Signed)
 Addended by: RODGERS LACY on: 06/26/2024 11:19 AM   Modules accepted: Orders

## 2024-06-28 ENCOUNTER — Ambulatory Visit: Admitting: Physician Assistant

## 2024-06-28 LAB — UNLABELED: Test Ordered On Req: 17306

## 2024-06-28 LAB — HOUSE ACCOUNT TRACKING

## 2024-07-04 ENCOUNTER — Telehealth: Payer: Self-pay | Admitting: Physician Assistant

## 2024-07-04 NOTE — Telephone Encounter (Signed)
 Pt called to see the status of her prolia injection. Please call pt at at [phone].

## 2024-07-06 ENCOUNTER — Encounter: Payer: Medicare HMO | Admitting: Family Medicine

## 2024-07-07 ENCOUNTER — Other Ambulatory Visit: Payer: Self-pay | Admitting: Family Medicine

## 2024-07-10 ENCOUNTER — Telehealth: Payer: Self-pay

## 2024-07-10 NOTE — Telephone Encounter (Signed)
 Submitted for Prolia and she was advised that I would reach out to her by Friday with information from her insurance and a price

## 2024-07-10 NOTE — Telephone Encounter (Signed)
 Submitted for Prolia

## 2024-07-13 ENCOUNTER — Telehealth: Payer: Self-pay

## 2024-07-13 NOTE — Telephone Encounter (Signed)
 Submitted for Prolia on 07-10-24

## 2024-07-17 ENCOUNTER — Telehealth: Payer: Self-pay

## 2024-07-23 ENCOUNTER — Other Ambulatory Visit: Payer: Self-pay | Admitting: Family Medicine

## 2024-07-28 ENCOUNTER — Telehealth: Payer: Self-pay | Admitting: Radiology

## 2024-07-28 NOTE — Telephone Encounter (Signed)
 I called to speak with patient about her upcoming injection appt, make sure she understands, etc.  She says no one has called her since last visit.  We need to make sure she is approved, etc. I see a scanned sheet saying patient's insurance will not speak with 3rd party.  Metta, can you update?  Need to make sure we can still do the injection on Tuesday.  I do not see the referral for prolia created in chart with any notes- see me about that workflow?    Ronal Dragon, patient also says that she thinks she is hurting herself doing the exercises you all gave her, she feels the instructions are not clear.  I told her to stop.  She is asking for an OP PT referral please.

## 2024-07-31 ENCOUNTER — Other Ambulatory Visit (HOSPITAL_BASED_OUTPATIENT_CLINIC_OR_DEPARTMENT_OTHER): Payer: Self-pay | Admitting: Physician Assistant

## 2024-07-31 ENCOUNTER — Telehealth: Payer: Self-pay | Admitting: Radiology

## 2024-07-31 DIAGNOSIS — M25512 Pain in left shoulder: Secondary | ICD-10-CM

## 2024-07-31 NOTE — Telephone Encounter (Signed)
 I called patient again today, rescheduled her appt for tomorrow.  She is needing someone to call her to advise on insurance cost/OOP cost for injection, and if she can even have the injection or not.  Please call her today to discuss.  I Have her rescheduled to 9/4 for followup/see how she is doing.  This can be injection appt if we get to that point by then.    Ronal Dragon, please also advise on OP PT referral per patient request?  Thanks.

## 2024-08-01 ENCOUNTER — Ambulatory Visit: Admitting: Physician Assistant

## 2024-08-01 ENCOUNTER — Telehealth: Payer: Self-pay

## 2024-08-01 NOTE — Telephone Encounter (Signed)
 Copied from CRM [id number]. Topic: Clinical - Prescription Issue >> Jul 28, 2024  3:58 PM Lindsay Reyes wrote: Reason for CRM: Patient wants all automatic refills of pantoprazole  (PROTONIX ) 40 MG tablets to stop at this time, she states she had at least 8 month supply at this time.

## 2024-08-01 NOTE — Telephone Encounter (Signed)
 I called and spoke with patient and notified her to contact her pharmacy to make sure they take her Rx of Pantoprazole  off automated refills.   Patient will call.

## 2024-08-09 ENCOUNTER — Telehealth: Payer: Self-pay

## 2024-08-09 ENCOUNTER — Telehealth: Payer: Self-pay | Admitting: Physician Assistant

## 2024-08-09 NOTE — Telephone Encounter (Signed)
 Patient called to return your call. [phone]

## 2024-08-09 NOTE — Telephone Encounter (Signed)
 Called patient and LVM that we are waiting on PA from the insurance and to please call the office and ask for me

## 2024-08-10 ENCOUNTER — Ambulatory Visit: Admitting: Physician Assistant

## 2024-08-13 ENCOUNTER — Emergency Department (HOSPITAL_BASED_OUTPATIENT_CLINIC_OR_DEPARTMENT_OTHER)

## 2024-08-13 ENCOUNTER — Emergency Department (HOSPITAL_BASED_OUTPATIENT_CLINIC_OR_DEPARTMENT_OTHER): Admission: EM | Admit: 2024-08-13 | Discharge: 2024-08-13 | Disposition: A | Discharge status: Home / Self Care

## 2024-08-13 ENCOUNTER — Encounter (HOSPITAL_BASED_OUTPATIENT_CLINIC_OR_DEPARTMENT_OTHER): Payer: Self-pay

## 2024-08-13 ENCOUNTER — Other Ambulatory Visit: Payer: Self-pay

## 2024-08-13 DIAGNOSIS — Z79899 Other long term (current) drug therapy: Secondary | ICD-10-CM | POA: Diagnosis not present

## 2024-08-13 DIAGNOSIS — R0789 Other chest pain: Secondary | ICD-10-CM | POA: Diagnosis not present

## 2024-08-13 DIAGNOSIS — W19XXXA Unspecified fall, initial encounter: Secondary | ICD-10-CM | POA: Insufficient documentation

## 2024-08-13 DIAGNOSIS — R0781 Pleurodynia: Secondary | ICD-10-CM | POA: Diagnosis not present

## 2024-08-13 MED ORDER — METHOCARBAMOL 500 MG PO TABS: 500.0000 mg | ORAL_TABLET | Freq: Two times a day (BID) | ORAL | 0 refills | Status: AC | PRN

## 2024-08-13 MED ORDER — LIDOCAINE 5 % EX PTCH
1.0000 | MEDICATED_PATCH | CUTANEOUS | 0 refills | Status: DC
Start: 2024-08-13 — End: 2024-08-18

## 2024-08-13 MED ORDER — LIDOCAINE 5 % EX PTCH
1.0000 | MEDICATED_PATCH | Freq: Once | CUTANEOUS | Status: DC
  Administered 2024-08-13: 1 via TRANSDERMAL
  Filled 2024-08-13: qty 1

## 2024-08-13 NOTE — ED Notes (Signed)
 Pt fell 8/31 and has had increasing left sided rib pain (under breast) No SHOB, just pain when she moves a certain way

## 2024-08-13 NOTE — Discharge Instructions (Signed)
 Please use the incentive inspirometer we have given you throughout the day.  You cannot use it too many times; but please try to get 15-20 times per day.  Take Tylenol  alternate with ibuprofen  for baseline pain control.  May also use lidocaine  patches and muscle relaxers we apply scribed you.  Please follow-up with your primary doctor.  Return for fevers, chills, severe pain, lightheadedness, dizziness, passout or any new or worsening symptoms that are concerning to you.
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
## 2024-08-13 NOTE — ED Triage Notes (Signed)
 Pt complaining of increase pain right side under breast due to fall on 08/06/2024. Became dizzy after standing up to fast causing her to initially fall. Pain increased today and has become sharp Pain with deep breathing. No SOB

## 2024-08-16 ENCOUNTER — Ambulatory Visit

## 2024-08-18 ENCOUNTER — Encounter: Payer: Self-pay | Admitting: Family Medicine

## 2024-08-18 ENCOUNTER — Ambulatory Visit (INDEPENDENT_AMBULATORY_CARE_PROVIDER_SITE_OTHER): Admitting: Family Medicine

## 2024-08-18 VITALS — BP 120/79 | HR 91 | Temp 97.3°F | Ht 63.5 in | Wt 140.8 lb

## 2024-08-18 DIAGNOSIS — K219 Gastro-esophageal reflux disease without esophagitis: Secondary | ICD-10-CM

## 2024-08-18 DIAGNOSIS — R739 Hyperglycemia, unspecified: Secondary | ICD-10-CM

## 2024-08-18 DIAGNOSIS — J3489 Other specified disorders of nose and nasal sinuses: Secondary | ICD-10-CM | POA: Diagnosis not present

## 2024-08-18 DIAGNOSIS — Z0001 Encounter for general adult medical examination with abnormal findings: Secondary | ICD-10-CM

## 2024-08-18 DIAGNOSIS — M810 Age-related osteoporosis without current pathological fracture: Secondary | ICD-10-CM | POA: Diagnosis not present

## 2024-08-18 DIAGNOSIS — E785 Hyperlipidemia, unspecified: Secondary | ICD-10-CM

## 2024-08-18 DIAGNOSIS — G4700 Insomnia, unspecified: Secondary | ICD-10-CM | POA: Diagnosis not present

## 2024-08-18 LAB — CBC
HCT: 42.4 % (ref 36.0–46.0)
Hemoglobin: 13.8 g/dL (ref 12.0–15.0)
MCHC: 32.6 g/dL (ref 30.0–36.0)
MCV: 91.6 fl (ref 78.0–100.0)
Platelets: 154 K/uL (ref 150.0–400.0)
RBC: 4.63 Mil/uL (ref 3.87–5.11)
RDW: 13 % (ref 11.5–15.5)
WBC: 2.9 K/uL — ABNORMAL LOW (ref 4.0–10.5)

## 2024-08-18 LAB — COMPREHENSIVE METABOLIC PANEL WITH GFR
ALT: 9 U/L (ref 0–35)
AST: 19 U/L (ref 0–37)
Albumin: 4.1 g/dL (ref 3.5–5.2)
Alkaline Phosphatase: 80 U/L (ref 39–117)
BUN: 18 mg/dL (ref 6–23)
CO2: 32 meq/L (ref 19–32)
Calcium: 10.7 mg/dL — ABNORMAL HIGH (ref 8.4–10.5)
Chloride: 102 meq/L (ref 96–112)
Creatinine, Ser: 0.91 mg/dL (ref 0.40–1.20)
GFR: 63.12 mL/min (ref >60.00)
Glucose, Bld: 97 mg/dL (ref 70–99)
Potassium: 3.8 meq/L (ref 3.5–5.1)
Sodium: 140 meq/L (ref 135–145)
Total Bilirubin: 0.7 mg/dL (ref 0.2–1.2)
Total Protein: 7.2 g/dL (ref 6.0–8.3)

## 2024-08-18 LAB — LIPID PANEL
Cholesterol: 140 mg/dL (ref 0–200)
HDL: 48.9 mg/dL (ref >39.00)
LDL Cholesterol: 77 mg/dL (ref 0–99)
NonHDL: 91.28
Total CHOL/HDL Ratio: 3
Triglycerides: 73 mg/dL (ref 0.0–149.0)
VLDL: 14.6 mg/dL (ref 0.0–40.0)

## 2024-08-18 LAB — TSH: TSH: 3.67 u[IU]/mL (ref 0.35–5.50)

## 2024-08-18 LAB — VITAMIN B12: Vitamin B-12: 552 pg/mL (ref 211–911)

## 2024-08-18 LAB — HEMOGLOBIN A1C: Hgb A1c MFr Bld: 5.7 % (ref 4.6–6.5)

## 2024-08-18 NOTE — Assessment & Plan Note (Signed)
 Symptoms are stable on Ambien  10 mg daily and melatonin.  She is on Seroquel  50 mg nightly though is working on weaning down on this.

## 2024-08-18 NOTE — Patient Instructions (Signed)
 It was very nice to see you today!  VISIT SUMMARY: Today, you had your annual physical exam. We discussed your recent fall, ongoing rib pain, sleep regimen, osteoporosis treatment, and other health concerns.  YOUR PLAN: RIB PAIN AFTER FALL: You have rib pain likely due to nerve irritation from your fall, with no fractures seen on the X-ray. -Continue taking Cymbalta  for pain management.  SHOULDER OSTEOARTHRITIS (ROTATOR CUFF): You have arthritis in your shoulder causing pain and limited motion. -Encourage gentle stretching exercises as tolerated.  OSTEOPOROSIS: You are awaiting insurance approval for Prolia treatment for osteoporosis. -Continue taking vitamin D and calcium  supplements.  INSOMNIA: Your insomnia is being managed with zolpidem , melatonin, and patches. -Continue your current sleep regimen and wean off Seroquel .  GASTROESOPHAGEAL REFLUX DISEASE (GERD): Your GERD is well-controlled with your current medication. -Continue taking your GERD medication as prescribed.  CHRONIC POSTNASAL DRIP: You have persistent nasal drainage likely due to sinus issues. -Consider using Claritin or Zyrtec if symptoms worsen. -Continue occasional use of Benadryl as needed.  HYPERLIPIDEMIA: You are interested in discontinuing Lipitor for cholesterol management. -We will check your cholesterol levels and consider discontinuing Lipitor if levels are acceptable.  DIET-CONTROLLED HYPERGLYCEMIA: Your blood sugar levels are managed with dietary modifications. -Continue with your dietary modifications to control sugar intake.  ADULT WELLNESS VISIT: Your blood pressure is well-controlled and we will perform blood work to check various health markers. -We will check your cholesterol, sugar, kidney, liver, and thyroid  function. -We listened to your heart and lungs and found no acute concerns.  Return in about 1 year (around 08/18/2025) for Annual Physical.   Take care, Dr Kennyth  PLEASE NOTE:  If  you had any lab tests, please let us  know if you have not heard back within a few days. You may see your results on mychart before we have a chance to review them but we will give you a call once they are reviewed by us .   If we ordered any referrals today, please let us  know if you have not heard from their office within the next week.   If you had any urgent prescriptions sent in today, please check with the pharmacy within an hour of our visit to make sure the prescription was transmitted appropriately.   Please try these tips to maintain a healthy lifestyle:  Eat at least 3 REAL meals and 1-2 snacks per day.  Aim for no more than 5 hours between eating.  If you eat breakfast, please do so within one hour of getting up.   Each meal should contain half fruits/vegetables, one quarter protein, and one quarter carbs (no bigger than a computer mouse)  Cut down on sweet beverages. This includes juice, soda, and sweet tea.   Drink at least 1 glass of water with each meal and aim for at least 8 glasses per day  Exercise at least 150 minutes every week.    Preventive Care 82 Years and Older, Female Preventive care refers to lifestyle choices and visits with your health care provider that can promote health and wellness. Preventive care visits are also called wellness exams. What can I expect for my preventive care visit? Counseling Your health care provider may ask you questions about your: Medical history, including: Past medical problems. Family medical history. Pregnancy and menstrual history. History of falls. Current health, including: Memory and ability to understand (cognition). Emotional well-being. Home life and relationship well-being. Sexual activity and sexual health. Lifestyle, including: Alcohol, nicotine or tobacco,  and drug use. Access to firearms. Diet, exercise, and sleep habits. Work and work Astronomer. Sunscreen use. Safety issues such as seatbelt and bike  helmet use. Physical exam Your health care provider will check your: Height and weight. These may be used to calculate your BMI (body mass index). BMI is a measurement that tells if you are at a healthy weight. Waist circumference. This measures the distance around your waistline. This measurement also tells if you are at a healthy weight and may help predict your risk of certain diseases, such as type 2 diabetes and high blood pressure. Heart rate and blood pressure. Body temperature. Skin for abnormal spots. What immunizations do I need?  Vaccines are usually given at various ages, according to a schedule. Your health care provider will recommend vaccines for you based on your age, medical history, and lifestyle or other factors, such as travel or where you work. What tests do I need? Screening Your health care provider may recommend screening tests for certain conditions. This may include: Lipid and cholesterol levels. Hepatitis C test. Hepatitis B test. HIV (human immunodeficiency virus) test. STI (sexually transmitted infection) testing, if you are at risk. Lung cancer screening. Colorectal cancer screening. Diabetes screening. This is done by checking your blood sugar (glucose) after you have not eaten for a while (fasting). Mammogram. Talk with your health care provider about how often you should have regular mammograms. BRCA-related cancer screening. This may be done if you have a family history of breast, ovarian, tubal, or peritoneal cancers. Bone density scan. This is done to screen for osteoporosis. Talk with your health care provider about your test results, treatment options, and if necessary, the need for more tests. Follow these instructions at home: Eating and drinking  Eat a diet that includes fresh fruits and vegetables, whole grains, lean protein, and low-fat dairy products. Limit your intake of foods with high amounts of sugar, saturated fats, and salt. Take vitamin  and mineral supplements as recommended by your health care provider. Do not drink alcohol if your health care provider tells you not to drink. If you drink alcohol: Limit how much you have to 0-1 drink a day. Know how much alcohol is in your drink. In the [place] one drink equals one 12 oz bottle of beer (355 mL), one 5 oz glass of wine (148 mL), or one 1 oz glass of hard liquor (44 mL). Lifestyle Brush your teeth every morning and night with fluoride toothpaste. Floss one time each day. Exercise for at least 30 minutes 5 or more days each week. Do not use any products that contain nicotine or tobacco. These products include cigarettes, chewing tobacco, and vaping devices, such as e-cigarettes. If you need help quitting, ask your health care provider. Do not use drugs. If you are sexually active, practice safe sex. Use a condom or other form of protection in order to prevent STIs. Take aspirin  only as told by your health care provider. Make sure that you understand how much to take and what form to take. Work with your health care provider to find out whether it is safe and beneficial for you to take aspirin  daily. Ask your health care provider if you need to take a cholesterol-lowering medicine (statin). Find healthy ways to manage stress, such as: Meditation, yoga, or listening to music. Journaling. Talking to a trusted person. Spending time with friends and family. Minimize exposure to UV radiation to reduce your risk of skin cancer. Safety Always wear your seat  belt while driving or riding in a vehicle. Do not drive: If you have been drinking alcohol. Do not ride with someone who has been drinking. When you are tired or distracted. While texting. If you have been using any mind-altering substances or drugs. Wear a helmet and other protective equipment during sports activities. If you have firearms in your house, make sure you follow all gun safety procedures. What's next? Visit your  health care provider once a year for an annual wellness visit. Ask your health care provider how often you should have your eyes and teeth checked. Stay up to date on all vaccines. This information is not intended to replace advice given to you by your health care provider. Make sure you discuss any questions you have with your health care provider. Document Revised: 05/21/2021 Document Reviewed: 05/21/2021 Elsevier Patient Education  2024 [employer].

## 2024-08-18 NOTE — Assessment & Plan Note (Signed)
 Stable on Protonix  40 mg daily.  Check B12 today.

## 2024-08-18 NOTE — Assessment & Plan Note (Signed)
 Check Lindsay Reyes.  She is on Lipitor 20 mg daily though is interested in decreasing or discontinuing if possible.

## 2024-08-18 NOTE — Progress Notes (Signed)
 Chief Complaint:  Lindsay Reyes is a 73 y.o. female who presents today for her annual comprehensive physical exam.    Assessment/Plan:  New/Acute Problems: Rib Pain  Recently seen in the ED for this.  Symptoms are improving.  She will let us  know if she has any lingering symptoms.    Chronic Problems Addressed Today: GERD Stable on Protonix  40 mg daily.  Check B12 today.  Insomnia Symptoms are stable on Ambien  10 mg daily and melatonin.  She is on Seroquel  50 mg nightly though is working on weaning down on this.  Osteoporosis She is now following with osteoporosis clinic with orthopedics and is working on getting approved for Prolia.  She is taking calcium  and vitamin D supplementation.  Rhinorrhea Overall symptoms are manageable.  Recommended over-the-counter allergy meds as needed.  Dyslipidemia Check Opitz.  She is on Lipitor 20 mg daily though is interested in decreasing or discontinuing if possible.  Hyperglycemia Check A1c.  We discussed lifestyle modifications.  Preventative Healthcare: Check labs.  Vaccines declined.  Patient Counseling(The following topics were reviewed and/or handout was given):  -Nutrition: Stressed importance of moderation in sodium/caffeine intake, saturated fat and cholesterol, caloric balance, sufficient intake of fresh fruits, vegetables, and fiber.  -Stressed the importance of regular exercise.   -Substance Abuse: Discussed cessation/primary prevention of tobacco, alcohol, or other drug use; driving or other dangerous activities under the influence; availability of treatment for abuse.   -Injury prevention: Discussed safety belts, safety helmets, smoke detector, smoking near bedding or upholstery.   -Sexuality: Discussed sexually transmitted diseases, partner selection, use of condoms, avoidance of unintended pregnancy and contraceptive alternatives.   -Dental health: Discussed importance of regular tooth brushing, flossing, and dental  visits.  -Health maintenance and immunizations reviewed. Please refer to Health maintenance section.  Return to care in 1 year for next preventative visit.     Subjective:  HPI:  She has no acute complaints today. Patient is here today for her  annual physical.  See assessment / plan for status of chronic conditions.  Discussed the use of AI scribe software for clinical note transcription with the patient, who gave verbal consent to proceed.  History of Present Illness Lindsay Reyes is a 73 year old female who presents for an annual physical exam.  She experienced a fall on September 1st after standing up too quickly, resulting in rib pain. Initially, she did not seek medical attention, but later visited the emergency room due to severe, sharp rib pain described as 'somebody stabbing me.' An x-ray showed no fractures or bruising. She continues to experience pain in the rib under her breast, making it difficult to take deep breaths. She manages the pain with Cymbalta , which was originally prescribed for back pain.  She is in the process of weaning off Seroquel  and is currently using zolpidem  and melatonin patches for sleep. She reports improved sleep quality with this regimen, including the use of sleepy time tea with collagen.  She is awaiting results from a bone density scan conducted over a month ago, which is delayed due to insurance issues. She reports that she was told she has osteoporosis and is awaiting approval for Prolia treatment. She takes vitamin D and multivitamins regularly but has not been able to exercise since her fall due to rib and shoulder pain. She has arthritis in her shoulder, which limits her ability to lift weights.  She experiences persistent nasal drainage and phlegm, which she attributes to sinus issues. She has  tried nasal sprays without significant relief and occasionally uses Benadryl.  She takes medication for heartburn and reports that missing doses  leads to symptoms. She has a history of swollen glands, which she experienced recently but resolved without intervention.  She is diligent about managing her sugar intake and is interested in discontinuing her cholesterol medication, Lipitor, pending current blood work results.       08/18/2024    1:01 PM  Depression screen PHQ 2/9  Decreased Interest 0  Down, Depressed, Hopeless 0  PHQ - 2 Score 0  Altered sleeping 0  Tired, decreased energy 0  Change in appetite 0  Feeling bad or failure about yourself  0  Trouble concentrating 0  Moving slowly or fidgety/restless 0  Suicidal thoughts 0  PHQ-9 Score 0  Difficult doing work/chores Not difficult at all    There are no preventive care reminders to display for this patient.   ROS: Per HPI, otherwise a complete review of systems was negative.   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   Stress 06/11/2020   Dyslipidemia 04/30/2020   Hyperglycemia 04/30/2020   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   Pelvic pain 08/10/2019   Rhinorrhea 02/16/2019   Sleep-disordered breathing 02/16/2019   Osteoporosis 05/03/2018   Insomnia 03/01/2018   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   Urinary incontinence 01/18/2018   LOW BACK PAIN, CHRONIC 12/12/2007   Diverticulosis of colon 08/25/2007   GERD 04/15/2007   Past Surgical History:  Procedure Laterality Date   ABDOMINAL HYSTERECTOMY  2002   COLONOSCOPY  2004   as of 10/19/22-pt unsure when had  it done and not sure if she had polyps   UPPER GASTROINTESTINAL ENDOSCOPY  2021   2008 both with MS    Family History  Problem Relation Age of Onset   Hypertension Mother    Diabetes Mother    Osteoporosis Mother    Heart Problems Mother    Colon polyps Sister    Breast cancer Sister    Colon cancer Sister    Thyroid  cancer Sister    Esophageal cancer Neg Hx    Stomach cancer Neg Hx    Rectal cancer Neg Hx     Medications- reviewed and updated Current Outpatient Medications  Medication Sig Dispense Refill   atorvastatin  (LIPITOR) 20 MG tablet TAKE 1 TABLET BY MOUTH EVERY DAY 90 tablet 1   celecoxib  (CELEBREX ) 200 MG capsule Oral for 30 Days     Cholecalciferol (VITAMIN D3) 50 MCG (2000 UT) CAPS Take by mouth.     DULoxetine  (CYMBALTA ) 30 MG capsule Take 1 capsule (30 mg total) by mouth 2 (two) times daily. 180 capsule 1   ibuprofen  (ADVIL ) 800 MG tablet Take 800 mg by mouth every 6 (six) hours as needed.     MELATONIN GUMMIES PO Take 40 mg by mouth at bedtime as needed. Takes 4 10 mg gummies     methocarbamol  (ROBAXIN ) 500 MG tablet Take 1 tablet (  500 mg total) by mouth 2 (two) times daily as needed for muscle spasms. 14 tablet 0   Multiple Vitamin (MULTIVITAMIN) tablet Take 1 tablet by mouth daily.     pantoprazole  (PROTONIX ) 40 MG tablet TAKE 1 TABLET (40 MG TOTAL) BY MOUTH TWICE A DAY BEFORE MEALS 180 tablet 3   QUEtiapine  (SEROQUEL ) 25 MG tablet TAKE 2 TABLETS BY MOUTH AT BEDTIME 180 tablet 0   zolpidem  (AMBIEN ) 10 MG tablet TAKE 1 TABLET BY MOUTH EVERY DAY AT BEDTIME AS NEEDED FOR SLEEP 30 tablet 5   No current facility-administered medications for this visit.    Allergies-reviewed and updated No Known Allergies  Social History   Socioeconomic History   Marital status: Single    Spouse name: Not on file   Number of children: Not on file   Years of education: Not on file   Highest education level: Not on file  Occupational History   Not on file  Tobacco Use    Smoking status: Never   Smokeless tobacco: Never  Vaping Use   Vaping status: Never Used  Substance and Sexual Activity   Alcohol use: Not Currently    Comment: socially, couple times a year   Drug use: Never   Sexual activity: Not on file  Other Topics Concern   Not on file  Social History Narrative   Right handed   Retired    Chief Executive Officer Drivers of Corporate investment banker Strain: Low Risk  (04/06/2024)   Overall Financial Resource Strain (CARDIA)    Difficulty of Paying Living Expenses: Not hard at all  Food Insecurity: No Food Insecurity (04/06/2024)   Hunger Vital Sign    Worried About Running Out of Food in the Last Year: Never true    Ran Out of Food in the Last Year: Never true  Transportation Needs: No Transportation Needs (04/06/2024)   PRAPARE - Administrator, Civil Service (Medical): No    Lack of Transportation (Non-Medical): No  Physical Activity: Sufficiently Active (04/06/2024)   Exercise Vital Sign    Days of Exercise per Week: 5 days    Minutes of Exercise per Session: 30 min  Stress: No Stress Concern Present (04/06/2024)   [employer] of Occupational Health - Occupational Stress Questionnaire    Feeling of Stress : Not at all  Social Connections: Socially Isolated (04/06/2024)   Social Connection and Isolation Panel    Frequency of Communication with Friends and Family: Twice a week    Frequency of Social Gatherings with Friends and Family: Twice a week    Attends Religious Services: Never    Database administrator or Organizations: No    Attends Engineer, structural: Never    Marital Status: Never married        Objective:  Physical Exam: BP 120/79   Pulse 91   Temp (!) 97.3 F (36.3 C) (Temporal)   Ht 5' 3.5 (1.613 m)   Wt 140 lb 12.8 oz (63.9 kg)   SpO2 96%   BMI 24.55 kg/m   Body mass index is 24.55 kg/m. Wt Readings from Last 3 Encounters:  08/18/24 140 lb 12.8 oz (63.9 kg)  08/13/24 144 lb (65.3 kg)  04/06/24 145 lb  (65.8 kg)   Gen: NAD, resting comfortably HEENT: TMs normal bilaterally. OP clear. No thyromegaly noted.  CV: RRR with no murmurs appreciated Pulm: NWOB, CTAB with no crackles, wheezes, or rhonchi GI: Normal bowel sounds present. Soft, Nontender, Nondistended. MSK:  no edema, cyanosis, or clubbing noted Skin: warm, dry Neuro: CN2-12 grossly intact. Strength 5/5 in upper and lower extremities. Reflexes symmetric and intact bilaterally.  Psych: Normal affect and thought content     Aulton Routt M. Kennyth, MD 08/18/2024 1:33 PM

## 2024-08-18 NOTE — Assessment & Plan Note (Signed)
 Overall symptoms are manageable.  Recommended over-the-counter allergy meds as needed.

## 2024-08-18 NOTE — Assessment & Plan Note (Signed)
 Check A1c.  We discussed lifestyle modifications.

## 2024-08-18 NOTE — Assessment & Plan Note (Signed)
 She is now following with osteoporosis clinic with orthopedics and is working on getting approved for Prolia.  She is taking calcium  and vitamin D supplementation.

## 2024-08-22 ENCOUNTER — Telehealth: Payer: Self-pay | Admitting: Physician Assistant

## 2024-08-22 ENCOUNTER — Ambulatory Visit: Payer: Self-pay | Admitting: Family Medicine

## 2024-08-22 DIAGNOSIS — D709 Neutropenia, unspecified: Secondary | ICD-10-CM

## 2024-08-22 DIAGNOSIS — D7289 Other specified disorders of white blood cells: Secondary | ICD-10-CM

## 2024-08-22 NOTE — Progress Notes (Signed)
 Her white blood cell count is low.  This is likely nothing to worry about but I would like for her to come back in 1 month to recheck.  Please place future order for CBC with differential.  Her A1c is up a little bit.  Do not need to start meds for this but she should continue to work on diet and exercise and we can recheck again in a year or so.  Cholesterol is much better controlled this year compared to last year.  Everything else is at goal and we can recheck in a year.

## 2024-08-22 NOTE — Telephone Encounter (Signed)
 Patient called and said she wants to know the process of the prolia injection. [phone]

## 2024-08-25 ENCOUNTER — Telehealth: Payer: Self-pay | Admitting: *Deleted

## 2024-08-25 NOTE — Telephone Encounter (Signed)
 Patient notified Per Dr Kennyth  I would not recommend stopping completely as her numbers will probably rebound.  We can try decreasing the dose to 20 mg daily and having her come back in 6 months to recheck. Verbalized understanding

## 2024-08-25 NOTE — Telephone Encounter (Signed)
 Copied from CRM [id number]. Topic: Clinical - Medication Question >> Aug 25, 2024 11:23 AM Jasmin G wrote: Reason for CRM: Pt wanted to see if it would be okay for her to stop taking her atorvastatin  (LIPITOR) 20 MG tablet since recent lab results were good. Call her back at [phone].   Please advise  Anajulia Leyendecker,RMA

## 2024-08-25 NOTE — Telephone Encounter (Signed)
 I would not recommend stopping completely as her numbers will probably rebound.  We can try decreasing the dose to 20 mg daily and having her come back in 6 months to recheck.

## 2024-08-26 ENCOUNTER — Other Ambulatory Visit: Payer: Self-pay

## 2024-08-26 ENCOUNTER — Emergency Department (HOSPITAL_BASED_OUTPATIENT_CLINIC_OR_DEPARTMENT_OTHER)
Admission: EM | Admit: 2024-08-26 | Discharge: 2024-08-26 | Disposition: A | Discharge status: Home / Self Care | Attending: Emergency Medicine | Admitting: Emergency Medicine

## 2024-08-26 ENCOUNTER — Emergency Department (HOSPITAL_BASED_OUTPATIENT_CLINIC_OR_DEPARTMENT_OTHER)

## 2024-08-26 DIAGNOSIS — K8020 Calculus of gallbladder without cholecystitis without obstruction: Secondary | ICD-10-CM | POA: Diagnosis not present

## 2024-08-26 DIAGNOSIS — K5730 Diverticulosis of large intestine without perforation or abscess without bleeding: Secondary | ICD-10-CM | POA: Diagnosis not present

## 2024-08-26 DIAGNOSIS — N132 Hydronephrosis with renal and ureteral calculous obstruction: Secondary | ICD-10-CM | POA: Diagnosis not present

## 2024-08-26 DIAGNOSIS — R339 Retention of urine, unspecified: Secondary | ICD-10-CM | POA: Diagnosis present

## 2024-08-26 DIAGNOSIS — R109 Unspecified abdominal pain: Secondary | ICD-10-CM | POA: Diagnosis not present

## 2024-08-26 DIAGNOSIS — N200 Calculus of kidney: Secondary | ICD-10-CM | POA: Diagnosis not present

## 2024-08-26 DIAGNOSIS — N201 Calculus of ureter: Secondary | ICD-10-CM

## 2024-08-26 LAB — URINALYSIS, ROUTINE W REFLEX MICROSCOPIC

## 2024-08-26 LAB — COMPREHENSIVE METABOLIC PANEL WITH GFR
ALT: 10 U/L (ref 0–44)
AST: 23 U/L (ref 15–41)
Albumin: 4.2 g/dL (ref 3.5–5.0)
Alkaline Phosphatase: 97 U/L (ref 38–126)
Anion gap: 13 (ref 5–15)
BUN: 14 mg/dL (ref 8–23)
CO2: 24 mmol/L (ref 22–32)
Calcium: 10.5 mg/dL — ABNORMAL HIGH (ref 8.9–10.3)
Chloride: 102 mmol/L (ref 98–111)
Creatinine, Ser: 0.89 mg/dL (ref 0.44–1.00)
GFR, Estimated: >60 mL/min (ref >60)
Glucose, Bld: 142 mg/dL — ABNORMAL HIGH (ref 70–99)
Potassium: 3.6 mmol/L (ref 3.5–5.1)
Sodium: 139 mmol/L (ref 135–145)
Total Bilirubin: 0.7 mg/dL (ref 0.0–1.2)
Total Protein: 7 g/dL (ref 6.5–8.1)

## 2024-08-26 LAB — CBC
HCT: 38.7 % (ref 36.0–46.0)
Hemoglobin: 12.8 g/dL (ref 12.0–15.0)
MCH: 30.4 pg (ref 26.0–34.0)
MCHC: 33.1 g/dL (ref 30.0–36.0)
MCV: 91.9 fL (ref 80.0–100.0)
Platelets: 132 K/uL — ABNORMAL LOW (ref 150–400)
RBC: 4.21 MIL/uL (ref 3.87–5.11)
RDW: 12.4 % (ref 11.5–15.5)
WBC: 3.5 K/uL — ABNORMAL LOW (ref 4.0–10.5)
nRBC: 0 % (ref 0.0–0.2)

## 2024-08-26 LAB — URINALYSIS, MICROSCOPIC (REFLEX)

## 2024-08-26 MED ORDER — SODIUM CHLORIDE 0.9 % IV BOLUS: 500.0000 mL | Freq: Once | INTRAVENOUS | Status: DC

## 2024-08-26 MED ORDER — CELECOXIB 200 MG PO CAPS: 200.0000 mg | ORAL_CAPSULE | Freq: Two times a day (BID) | ORAL | 0 refills | Status: AC | PRN

## 2024-08-26 MED ORDER — ONDANSETRON 4 MG PO TBDP: 4.0000 mg | ORAL_TABLET | Freq: Three times a day (TID) | ORAL | 0 refills | Status: AC | PRN

## 2024-08-26 MED ORDER — SODIUM CHLORIDE 0.9 % IV BOLUS
500.0000 mL | Freq: Once | INTRAVENOUS | Status: AC
  Administered 2024-08-26: 500 mL via INTRAVENOUS

## 2024-08-26 MED ORDER — CEFADROXIL 500 MG PO CAPS: 500.0000 mg | ORAL_CAPSULE | Freq: Two times a day (BID) | ORAL | 0 refills | Status: AC

## 2024-08-26 MED ORDER — KETOROLAC TROMETHAMINE 15 MG/ML IJ SOLN
15.0000 mg | Freq: Once | INTRAMUSCULAR | Status: AC
  Administered 2024-08-26: 15 mg via INTRAVENOUS
  Filled 2024-08-26: qty 1

## 2024-08-26 MED ORDER — OXYCODONE HCL 5 MG PO TABS: 2.5000 mg | ORAL_TABLET | Freq: Four times a day (QID) | ORAL | 0 refills | Status: AC | PRN

## 2024-08-26 NOTE — ED Provider Notes (Addendum)
 [facility] Provider Note   CSN: [id number] Arrival date & time: 08/26/24  1805     Patient presents with: Urinary Retention   Lindsay Reyes is a 74 y.o. female.   HPI   73 year old female presents emergency department with complaints of increased urinary urgency/frequency, lower abdominal pain.  Has been having symptoms since yesterday.  States that she has been able to urinate today but just dribbles and feels like she is running constantly back-and-forth to the bathroom.  Denies any dysuria.    Took Azo without improvement which she states has helped in the past when she has felt like she has had a UTI.  Denies any fevers, flank pain.  Does report lower middle abdominal pain.  Past medical history significant for hyperlipidemia, GERD, scoliosis, depression, arthritis  Prior to Admission medications   Medication Sig Start Date End Date Taking? Authorizing Provider  atorvastatin  (LIPITOR) 20 MG tablet TAKE 1 TABLET BY MOUTH EVERY DAY 07/24/24   Kennyth Worth HERO, MD  celecoxib  (CELEBREX ) 200 MG capsule Oral for 30 Days    [provider]  Cholecalciferol (VITAMIN D3) 50 MCG (2000 UT) CAPS Take by mouth.    [provider]  DULoxetine  (CYMBALTA ) 30 MG capsule Take 1 capsule (30 mg total) by mouth 2 (two) times daily. 05/26/24   Williams, Megan E, NP  ibuprofen  (ADVIL ) 800 MG tablet Take 800 mg by mouth every 6 (six) hours as needed. 10/15/22   [provider]  MELATONIN GUMMIES PO Take 40 mg by mouth at bedtime as needed. Takes 4 10 mg gummies    [provider]  methocarbamol  (ROBAXIN ) 500 MG tablet Take 1 tablet (500 mg total) by mouth 2 (two) times daily as needed for muscle spasms. 08/13/24   Neysa Caron PARAS, DO  Multiple Vitamin (MULTIVITAMIN) tablet Take 1 tablet by mouth daily.    [provider]  pantoprazole  (PROTONIX ) 40 MG tablet TAKE 1 TABLET (40 MG TOTAL) BY MOUTH TWICE A DAY BEFORE MEALS  01/28/24   Kennyth Worth HERO, MD  QUEtiapine  (SEROQUEL ) 25 MG tablet TAKE 2 TABLETS BY MOUTH AT BEDTIME 07/07/24   Kennyth Worth HERO, MD  zolpidem  (AMBIEN ) 10 MG tablet TAKE 1 TABLET BY MOUTH EVERY DAY AT BEDTIME AS NEEDED FOR SLEEP 06/26/24   Kennyth Worth HERO, MD    Allergies: Patient has no known allergies.    Review of Systems  All other systems reviewed and are negative.   Updated Vital Signs BP (!) 179/102   Pulse 80   Temp 98.2 F (36.8 C)   Resp 16   Ht 5' 3.5 (1.613 m)   SpO2 97%   BMI 24.55 kg/m   Physical Exam Vitals and nursing note reviewed.  Constitutional:      General: She is not in acute distress.    Appearance: She is well-developed.  HENT:     Head: Normocephalic and atraumatic.  Eyes:     Conjunctiva/sclera: Conjunctivae normal.  Cardiovascular:     Rate and Rhythm: Normal rate and regular rhythm.  Pulmonary:     Effort: Pulmonary effort is normal. No respiratory distress.     Breath sounds: Normal breath sounds.  Abdominal:     Palpations: Abdomen is soft.     Tenderness: There is abdominal tenderness.     Comments: Suprapubic tenderness.  Musculoskeletal:        General: No swelling.     Cervical back: Neck supple.  Skin:  General: Skin is warm and dry.     Capillary Refill: Capillary refill takes less than 2 seconds.  Neurological:     Mental Status: She is alert.  Psychiatric:        Mood and Affect: Mood normal.     (all labs ordered are listed, but only abnormal results are displayed) Labs Reviewed  URINALYSIS, ROUTINE W REFLEX MICROSCOPIC    EKG: None  Radiology: No results found.   Procedures   Medications Ordered in the ED - No data to display  Clinical Course as of 08/26/24 1833  Sat Aug 26, 2024  1831 Bladder scan x3 showed 0cc in bladder [CR]    Clinical Course User Index [CR] Silver Wonda LABOR, PA                                 Medical Decision Making Amount and/or Complexity of Data Reviewed Labs:  ordered. Radiology: ordered.  Risk Prescription drug management.   This patient presents to the ED for concern of urinary frequency/urgency, this involves an extensive number of treatment options, and is a complaint that carries with it a high risk of complications and morbidity.  The differential diagnosis includes cystitis, pyelonephritis, abscess, urinary retention, interstitial cystitis, other   Co morbidities that complicate the patient evaluation  See HPI   Additional history obtained:  Additional history obtained from EMR External records from outside source obtained and reviewed including hospital records   Lab Tests:  I Ordered, and personally interpreted labs.  The pertinent results include: UA orange unable to be entirely assessed due to Azo use.  No electrolyte abnormalities besides mild hypercalcemia of 10.5.  No transaminitis.  No renal dysfunction.  Leukopenia baseline 3.5.  Thrombocytopenia baseline 132.  No evidence of anemia.   Imaging Studies ordered:  I ordered imaging studies including CT renal stone study I independently visualized and interpreted imaging which showed 4 mm distal right ureteral calculus at UVJ.  Mild hydronephrosis.  Cholelithiasis.  Small profusion. I agree with the radiologist interpretation  Cardiac Monitoring: / EKG:  The patient was maintained on a cardiac monitor.  I personally viewed and interpreted the cardiac monitored which showed an underlying rhythm of: sinus rhythm   Consultations Obtained:  N/a   Problem List / ED Course / Critical interventions / Medication management  Ureterolithiasis I ordered medication including normal saline, toradol    Reevaluation of the patient after these medicines showed that the patient improved I have reviewed the patients home medicines and have made adjustments as needed   Social Determinants of Health:  Denies tobacco, illicit drug use   Test / Admission -  Considered:  Ureterolithiasis Vitals signs significant for initial hypertension.  The time labs and medicines administered on the emergency department.. Otherwise within normal range and stable throughout visit. Laboratory/imaging studies significant for: see above 73 year old female presents emergency department with complaints of increased urinary urgency/frequency.  Has been having symptoms since yesterday.  States that she has been able to urinate today but just dribbles and feels like she is running constantly back-and-forth to the bathroom.  Denies dysuria.  Took Azo without improvement which she states has helped in the past when she has had a UTI.SABRA  Denies any fevers, flank pain.  Does report lower middle abdominal pain. On exam, suprapubic tenderness without obvious distention.  Bedside ultrasound showed no obvious urinary retention.  UA unable to speak entirely assessed due  to Azo use at home.  Will culture urine and treat empirically for UTI.  Otherwise, labs without evidence of acute emergent process.  CT imaging concerning for right distal stone 5 mm at UVJ which is most likely etiology of symptoms.  Does not appear that patient is retaining urine.  Will treat patient's symptoms and recommend follow-up with urology in the outpatient setting.  Treatment plan discussed with patient she is understanding was agreeable.  Patient well-appearing, afebrile in no acute distress. Worrisome signs and symptoms were discussed with the patient, and the patient acknowledged understanding to return to the ED if noticed. Patient was stable upon discharge.       Final diagnoses:  None    ED Discharge Orders     None           Silver Wonda LABOR, [place] 08/26/24 2105    Lenor Hollering, MD 08/26/24 2211

## 2024-08-26 NOTE — ED Triage Notes (Signed)
 Pt c/o urinary retention, last voiding before bed yesterday.   Reports vaginal pressure since 1300 today, dysuria when attempting to void.  Only getting very small amount of urine out.   Took AZO today.

## 2024-08-26 NOTE — Discharge Instructions (Addendum)
 As discussed, you have a kidney stone on the right side which is most likely causing symptoms.  It appears as if its about to go into the bladder if it has not already.  It measures around 5 mm.  Will send you home with medicine for pain.  Your urine was difficult to assess given the Azo that you took earlier.  Will culture this and treat empirically for possible urinary tract infection with antibiotics.  Recommend follow-up with urology in the outpatient setting for reassessment.  Please do not hesitate to return to the emergency department if the worrisome signs and symptoms discussed become apparent.

## 2024-08-27 DIAGNOSIS — N201 Calculus of ureter: Secondary | ICD-10-CM | POA: Diagnosis not present

## 2024-08-27 DIAGNOSIS — N200 Calculus of kidney: Secondary | ICD-10-CM | POA: Diagnosis not present

## 2024-08-27 LAB — URINE CULTURE: Culture: <10000 — AB

## 2024-08-29 ENCOUNTER — Other Ambulatory Visit: Payer: Self-pay | Admitting: Physician Assistant

## 2024-08-29 DIAGNOSIS — M810 Age-related osteoporosis without current pathological fracture: Secondary | ICD-10-CM

## 2024-08-29 MED ORDER — DENOSUMAB 60 MG/ML ~~LOC~~ SOSY: 60.0000 mg | PREFILLED_SYRINGE | Freq: Once | SUBCUTANEOUS | Status: AC

## 2024-08-29 NOTE — Telephone Encounter (Signed)
 Metta, please call insurance to get benefits by phone, per below from [employer] 07/12/24.

## 2024-08-30 ENCOUNTER — Telehealth: Payer: Self-pay

## 2024-08-30 NOTE — Telephone Encounter (Signed)
 Transition Care Management Follow-up Telephone Call Date of discharge and from where: 08/27/2024 [facility] How have you been since you were released from the hospital? Better, passed kidney stone Any questions or concerns? No  Items Reviewed: Did the pt receive and understand the discharge instructions provided? Yes  Medications obtained and verified? Yes  Other? No  Any new allergies since your discharge? No  Dietary orders reviewed? No Do you have support at home? Yes   Home Care and Equipment/Supplies: Were home health services ordered? not applicable If so, what is the name of the agency?   Has the agency set up a time to come to the patient's home? not applicable Were any new equipment or medical supplies ordered?  No What is the name of the medical supply agency?  Were you able to get the supplies/equipment? not applicable Do you have any questions related to the use of the equipment or supplies? No  Functional Questionnaire: (I = Independent and D = Dependent) ADLs: I  Bathing/Dressing- I  Meal Prep- I  Eating- I  Maintaining continence- I  Transferring/Ambulation- I  Managing Meds- I  Follow up appointments reviewed:  PCP Hospital f/u appt confirmed? No  per pt appt not needed  Specialist Hospital f/u appt confirmed? No  Per pt appt not needed Are transportation arrangements needed? No  If their condition worsens, is the pt aware to call PCP or go to the Emergency Dept.? Yes Was the patient provided with contact information for the PCP's office or ED? Yes Was to pt encouraged to call back with questions or concerns? Yes

## 2024-08-31 NOTE — Telephone Encounter (Signed)
 Okay to refer to urology.  Nephrology does not manage kidney stones.

## 2024-08-31 NOTE — Telephone Encounter (Signed)
**Note De-identified  [place] Obfuscation** Please advise 

## 2024-08-31 NOTE — Telephone Encounter (Unsigned)
 Copied from CRM #[id number]. Topic: Referral - Request for Referral >> Aug 30, 2024  3:38 PM Berneda FALCON wrote: Did the patient discuss referral with their provider in the last year? No (If No - schedule appointment) (If Yes - send message)  Appointment offered? No, patient states that the office called this morning to check on her, has kidney stone and cannot tell if it passed  Type of order/referral and detailed reason for visit: kidney stone, cannot tell if it has passed or not and would like an appt to a nephrologist  Preference of office, provider, location: Prefers someone within [facility], but would prefer [facility] and someone who takes her insurance if possible  If referral order, have you been seen by this specialty before? No (If Yes, this issue or another issue? When? Where?  Can we respond through MyChart? No, she does not have a MyChart  Patient phone number is [phone]

## 2024-09-01 ENCOUNTER — Other Ambulatory Visit: Payer: Self-pay | Admitting: *Deleted

## 2024-09-01 DIAGNOSIS — N200 Calculus of kidney: Secondary | ICD-10-CM

## 2024-09-01 NOTE — Telephone Encounter (Signed)
 Referral placed.

## 2024-09-04 ENCOUNTER — Telehealth: Payer: Self-pay

## 2024-09-04 NOTE — Telephone Encounter (Signed)
 Had a missed call from patient and returned phone call and left voice mail to please return my call

## 2024-09-04 NOTE — Telephone Encounter (Signed)
 Called patient with the update of waiting on benefits to be faxed to me and will contact her with cost as soon as I receive the benefits  she was satisfied

## 2024-09-04 NOTE — Telephone Encounter (Signed)
 Per [employer] support the insurance plan

## 2024-09-14 ENCOUNTER — Telehealth: Payer: Self-pay | Admitting: Physician Assistant

## 2024-09-14 NOTE — Telephone Encounter (Signed)
 Received call from patient, stating she is returning a call but doesn't know who called her. It is regarding prolia  injection. Pts ph [phone]

## 2024-09-20 ENCOUNTER — Telehealth: Payer: Self-pay | Admitting: Physician Assistant

## 2024-09-20 NOTE — Telephone Encounter (Signed)
 Patient called and needs to get an Provila injection scheduled. [phone]

## 2024-09-21 NOTE — Telephone Encounter (Signed)
 Called, see referral for med.

## 2024-09-22 ENCOUNTER — Telehealth: Payer: Self-pay | Admitting: *Deleted

## 2024-09-22 NOTE — Telephone Encounter (Signed)
 Copied from CRM [id number]. Topic: Clinical - Medical Advice >> Sep 20, 2024  4:59 PM Jasmin G wrote: Reason for CRM: Pt requested a call back from Dr. Kennyth preferably if possible regarding questions that have to do with her osteoporis shot. Call pt back at [phone].   Patient stated no question at this time. Will talk about it on her next OV  [facility]

## 2024-10-02 ENCOUNTER — Telehealth: Payer: Self-pay | Admitting: *Deleted

## 2024-10-02 NOTE — Telephone Encounter (Signed)
 She should check with her pharmacy to see if her specific batch number was recalled.

## 2024-10-02 NOTE — Telephone Encounter (Signed)
 Copied from CRM [id number]. Topic: Clinical - Medical Advice >> Sep 29, 2024  2:30 PM Thersia C wrote: Reason for CRM: Patient called in stated she was on her phone and stated that the medication she is taking atorvastatin  (LIPITOR) 20 MG tablet is on recall, would like to know if that has anything to do with her   Please advise  [facility]

## 2024-10-02 NOTE — Telephone Encounter (Signed)
 Patient notified, contact pharmacy for specific batch number  Verbalized understanding

## 2024-11-02 DIAGNOSIS — G4489 Other headache syndrome: Secondary | ICD-10-CM | POA: Diagnosis not present

## 2024-11-02 DIAGNOSIS — K219 Gastro-esophageal reflux disease without esophagitis: Secondary | ICD-10-CM | POA: Diagnosis not present

## 2024-11-02 DIAGNOSIS — G2581 Restless legs syndrome: Secondary | ICD-10-CM | POA: Diagnosis not present

## 2024-11-02 DIAGNOSIS — R0789 Other chest pain: Secondary | ICD-10-CM | POA: Diagnosis not present

## 2024-11-02 DIAGNOSIS — Z20822 Contact with and (suspected) exposure to covid-19: Secondary | ICD-10-CM | POA: Diagnosis not present

## 2024-11-02 DIAGNOSIS — G4700 Insomnia, unspecified: Secondary | ICD-10-CM | POA: Diagnosis not present

## 2024-11-02 DIAGNOSIS — M542 Cervicalgia: Secondary | ICD-10-CM | POA: Diagnosis not present

## 2024-11-02 DIAGNOSIS — R0902 Hypoxemia: Secondary | ICD-10-CM | POA: Diagnosis not present

## 2024-11-02 DIAGNOSIS — R0602 Shortness of breath: Secondary | ICD-10-CM | POA: Diagnosis not present

## 2024-11-02 DIAGNOSIS — R6884 Jaw pain: Secondary | ICD-10-CM | POA: Diagnosis not present

## 2024-11-02 DIAGNOSIS — E7849 Other hyperlipidemia: Secondary | ICD-10-CM | POA: Diagnosis not present

## 2024-11-02 DIAGNOSIS — R10816 Epigastric abdominal tenderness: Secondary | ICD-10-CM | POA: Diagnosis not present

## 2024-11-02 DIAGNOSIS — R079 Chest pain, unspecified: Secondary | ICD-10-CM | POA: Diagnosis not present

## 2024-11-03 DIAGNOSIS — I517 Cardiomegaly: Secondary | ICD-10-CM | POA: Diagnosis not present

## 2024-11-03 DIAGNOSIS — R079 Chest pain, unspecified: Secondary | ICD-10-CM | POA: Diagnosis not present

## 2024-11-08 ENCOUNTER — Other Ambulatory Visit: Payer: Self-pay | Admitting: Physical Medicine and Rehabilitation

## 2024-11-09 ENCOUNTER — Ambulatory Visit: Admitting: Physician Assistant

## 2024-11-09 DIAGNOSIS — M810 Age-related osteoporosis without current pathological fracture: Secondary | ICD-10-CM | POA: Diagnosis not present

## 2024-11-09 MED ORDER — DENOSUMAB 60 MG/ML ~~LOC~~ SOSY
60.0000 mg | PREFILLED_SYRINGE | Freq: Once | SUBCUTANEOUS | Status: AC
  Administered 2024-11-09: 60 mg via SUBCUTANEOUS

## 2024-11-09 NOTE — Addendum Note (Signed)
 Addended by: Krystena Reitter on: 11/09/2024 02:19 PM   Modules accepted: Orders

## 2024-11-09 NOTE — Progress Notes (Signed)
 Office Visit Note   Patient: Lindsay Reyes           Date of Birth: [birth year]           MRN: [id number] Visit Date: 11/09/2024              Requested by: Kennyth Worth HERO, MD [address] [place],  [place] [address] PCP: Kennyth Worth HERO, MD  Chief Complaint  Patient presents with   Osteoporosis    PROLIA  INJ      HPI: Patient is a pleasant 73 year old woman comes in for her first Prolia  injection today.  Assessment & Plan: Visit Diagnoses:  1. Age-related osteoporosis without current pathological fracture     Plan: Patient was given an injection without difficulty.  She had several questions about calcium  and vitamin D we revisited those she also wanted to know we reviewed side effects of medication.  We once again discussed that she is at a 11% risk of hip fracture in the next 10 years and 24% risk of overall fracture.  This puts her at a higher risk especially with regards to the hip.  We discussed that although there are side effects to the medication she is at a higher risk for fracture.  She is willing to go forward with the injection  Follow-Up Instructions: No follow-ups on file.   Ortho Exam  Patient is alert, oriented, no adenopathy, well-dressed, normal affect, normal respiratory effort.     Imaging: No results found. No images are attached to the encounter.  Labs: Lab Results  Component Value Date   HGBA1C 5.7 08/18/2024   HGBA1C 5.3 07/06/2023   HGBA1C 5.4 04/25/2020   REPTSTATUS 08/27/2024 FINAL 08/26/2024   CULT (A) 08/26/2024    <10,000 COLONIES/mL INSIGNIFICANT GROWTH Performed at [facility], [address], [place], [place] [address]      Lab Results  Component Value Date   ALBUMIN 4.2 08/26/2024   ALBUMIN 4.1 08/18/2024   ALBUMIN 4.2 07/06/2023    No results found for: MG No results found for: VD25OH  No results found for: PREALBUMIN    Latest Ref Rng & Units 08/26/2024    7:44 PM 08/18/2024    1:35 PM 07/06/2023     1:48 PM  CBC EXTENDED  WBC 4.0 - 10.5 K/uL 3.5  2.9  3.6   RBC 3.87 - 5.11 MIL/uL 4.21  4.63  4.74   Hemoglobin 12.0 - 15.0 g/dL 87.1  86.1  85.7   HCT 36.0 - 46.0 % 38.7  42.4  43.7   Platelets 150 - 400 K/uL 132  154.0  179.0      There is no height or weight on file to calculate BMI.  Orders:  No orders of the defined types were placed in this encounter.  No orders of the defined types were placed in this encounter.    Procedures: No procedures performed  Clinical Data: No additional findings.  ROS:  All other systems negative, except as noted in the HPI. Review of Systems  Objective: Vital Signs: There were no vitals taken for this visit.  Specialty Comments:  EXAM: MRI LUMBAR SPINE WITHOUT CONTRAST   TECHNIQUE: Multiplanar, multisequence MR imaging of the lumbar spine was performed. No intravenous contrast was administered.   COMPARISON:  Lumbar radiographs Apr 10, 2021.   FINDINGS: Segmentation: 5 non rib-bearing lumbar vertebral bodies on prior radiographs.   Alignment:  Moderate Levocurvature centered at L2-L3.   Vertebrae: Scattered T1 hyperintense lesions  throughout the thoracolumbar spine and sacrum, compatible with benign vertebral venous malformations. Schmorl's node involving the inferior T12 endplate. Degenerative/discogenic endplate signal changes about the L2-L3 and L5-S1 discs. No specific evidence of acute fracture or discitis/osteomyelitis.   Conus medullaris and cauda equina: Conus extends to the L1 level. Conus appears normal.   Paraspinal and other soft tissues: Bilateral renal cysts. Paraspinal muscular atrophy. Mild right L4-L5 inflammatory perifacet edema.   Disc levels:   T12-L1: No significant disc protrusion, foraminal stenosis, or canal stenosis.   L1-L2: Slight disc bulging without significant canal or foraminal stenosis.   L2-L3: Mild right a centric disc bulge and mild right facet arthropathy without significant  canal or foraminal stenosis.   L3-L4: Disc height loss, eccentric to the right. Small left foraminal disc protrusion. Severe right and mild left facet arthropathy without significant canal or foraminal stenosis.   L4-L5: Moderate bilateral facet arthropathy. Mild disc bulging. Resulting mild left foraminal stenosis without significant canal or right foraminal stenosis.   L5-S1: Disc height loss. Mild bilateral facet arthropathy without significant canal or foraminal stenosis.   IMPRESSION: 1. Mild left foraminal stenosis at L4-L5. No significant canal stenosis. 2. Multilevel facet arthropathy, severe on the right at L4-L5 with mild perifacet edema. 3. Moderate Levocurvature centered at L2-L3 with multilevel degenerative disease.     Electronically Signed   By: Gilmore GORMAN Molt M.D.   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   Stress 06/11/2020   Dyslipidemia 04/30/2020   Hyperglycemia 04/30/2020   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   Pelvic pain 08/10/2019   Rhinorrhea 02/16/2019   Sleep-disordered breathing 02/16/2019   Osteoporosis 05/03/2018   Insomnia 03/01/2018   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   Urinary incontinence 01/18/2018   LOW BACK PAIN, CHRONIC 12/12/2007   Diverticulosis of colon 08/25/2007   GERD 04/15/2007   Past Medical History:  Diagnosis Date   Arthritis    Colon polyp    Depression    Diverticulosis    GERD (gastroesophageal reflux disease)    Hyperlipidemia    Insomnia    Retinal micro-aneurysm of right eye    Scoliosis     Family History  Problem Relation Age of Onset   Hypertension Mother    Diabetes Mother    Osteoporosis Mother    Heart Problems Mother    Colon polyps Sister    Breast  cancer Sister    Colon cancer Sister    Thyroid  cancer Sister    Esophageal cancer Neg Hx    Stomach cancer Neg Hx    Rectal cancer Neg Hx     Past Surgical History:  Procedure Laterality Date   ABDOMINAL HYSTERECTOMY  2002   COLONOSCOPY  2004   as of 10/19/22-pt unsure when had it done and not sure if she had polyps   UPPER GASTROINTESTINAL ENDOSCOPY  2021   2008 both with MS   Social History   Occupational History   Not on file  Tobacco Use   Smoking status: Never   Smokeless tobacco: Never  Vaping Use   Vaping status: Never Used  Substance and Sexual Activity   Alcohol use: Not Currently    Comment: socially, couple times a year   Drug use: Never   Sexual activity: Not on file

## 2024-11-10 ENCOUNTER — Telehealth: Payer: Self-pay

## 2024-11-10 NOTE — Telephone Encounter (Signed)
 Copied from CRM [id number]. Topic: Referral - Request for Referral >> Nov 10, 2024  1:40 PM Ahlexyia S wrote: Did the patient discuss referral with their provider in the last year? Yes (If No - schedule appointment) (If Yes - send message)  Appointment offered? No  Type of order/referral and detailed reason for visit: Dermatologist for skin issues  Preference of office, provider, location: No preference but not too far in distance.  If referral order, have you been seen by this specialty before? Yes, same issue. (If Yes, this issue or another issue? When? Where?  Can we respond through MyChart? No   No notes in the past year pt discussing skin issues with PCP; please advise on referral or pt needing to schedule an appointment with PCP

## 2024-11-10 NOTE — Telephone Encounter (Signed)
 Ok with me. Please place any necessary orders.

## 2024-11-13 ENCOUNTER — Other Ambulatory Visit: Payer: Self-pay

## 2024-11-13 DIAGNOSIS — L989 Disorder of the skin and subcutaneous tissue, unspecified: Secondary | ICD-10-CM

## 2024-11-13 NOTE — Telephone Encounter (Signed)
 Order for referral placed with PCP verbal ok

## 2024-11-21 ENCOUNTER — Other Ambulatory Visit: Payer: Self-pay | Admitting: Family Medicine

## 2024-11-22 ENCOUNTER — Telehealth: Payer: Self-pay | Admitting: *Deleted

## 2024-11-22 NOTE — Telephone Encounter (Signed)
 Copied from CRM #[id number]. Topic: Clinical - Medical Advice >> Nov 21, 2024  4:26 PM Shereese L wrote: Reason for CRM: Patient has a DNR and wants it to be activated only if she's being kept alive by machines. If CPR can be done or anything else can be done to bring her back to life she wants to be revived. Patient wants this stated on her DNR Patient wants a nurse to give her a call as well   Spoke with patient advise to speak with her layer for living will information  DNR is a form to not resuscitate is CPR needed  Verbalized understanding  Bethanne Mule,RMA

## 2024-12-11 ENCOUNTER — Telehealth: Payer: Self-pay | Admitting: Physician Assistant

## 2024-12-11 NOTE — Telephone Encounter (Signed)
 Pt called and have medical question about her dates for Prolia  injections. Pt is confused and need Ronal Dragon or CMA to call her back. Pt number is [phone]. Pt also states she has not gotten a bill. No one collected co pay when she checked in. Please call pt about this matter.

## 2024-12-15 ENCOUNTER — Ambulatory Visit (INDEPENDENT_AMBULATORY_CARE_PROVIDER_SITE_OTHER): Admitting: Family Medicine

## 2024-12-15 ENCOUNTER — Encounter: Payer: Self-pay | Admitting: Family Medicine

## 2024-12-15 VITALS — BP 122/82 | HR 91 | Temp 97.7°F | Ht 63.5 in | Wt 135.0 lb

## 2024-12-15 DIAGNOSIS — G259 Extrapyramidal and movement disorder, unspecified: Secondary | ICD-10-CM | POA: Diagnosis not present

## 2024-12-15 DIAGNOSIS — E785 Hyperlipidemia, unspecified: Secondary | ICD-10-CM | POA: Diagnosis not present

## 2024-12-15 MED ORDER — ATORVASTATIN CALCIUM 10 MG PO TABS: 5.0000 mg | ORAL_TABLET | Freq: Every day | ORAL | 3 refills | Status: AC

## 2024-12-15 NOTE — Patient Instructions (Signed)
 It was very nice to see you today!  VISIT SUMMARY: Today, we discussed your eye symptoms and their potential link to your cholesterol medication. We also reviewed your cholesterol levels and made adjustments to your medication.  YOUR PLAN: MOVEMENT DISORDER: You have a chronic movement disorder causing left eye heaviness and drooping, which may be worsened by your cholesterol medication. -Reduce your cholesterol medication dose to 5 mg by cutting a 10 mg tablet in half. -Consider taking CoQ10 supplements if symptoms persist after the dose adjustment.  HYPERLIPIDEMIA: Your cholesterol levels are well-controlled with your current medication, but the dose may be contributing to your eye symptoms. -Reduce your cholesterol medication dose to 5 mg by cutting a 10 mg tablet in half. -Recheck your lipid panel in a few months to see how the dose adjustment affects your cholesterol levels.  Return in about 3 months (around 03/15/2025) for cholesterol labs, Follow Up.   Take care, Dr Kennyth  PLEASE NOTE:  If you had any lab tests, please let us  know if you have not heard back within a few days. You may see your results on mychart before we have a chance to review them but we will give you a call once they are reviewed by us .   If we ordered any referrals today, please let us  know if you have not heard from their office within the next week.   If you had any urgent prescriptions sent in today, please check with the pharmacy within an hour of our visit to make sure the prescription was transmitted appropriately.   Please try these tips to maintain a healthy lifestyle:  Eat at least 3 REAL meals and 1-2 snacks per day.  Aim for no more than 5 hours between eating.  If you eat breakfast, please do so within one hour of getting up.   Each meal should contain half fruits/vegetables, one quarter protein, and one quarter carbs (no bigger than a computer mouse)  Cut down on sweet beverages. This includes  juice, soda, and sweet tea.   Drink at least 1 glass of water with each meal and aim for at least 8 glasses per day  Exercise at least 150 minutes every week.

## 2024-12-15 NOTE — Assessment & Plan Note (Signed)
 She is on Lipitor 20 mg daily but is concerned that this is worsening her involuntary movements and takes in her eyes and face.  She would like to decrease the dose of Lipitor to see if this will help with her symptoms.  Will go to 5 mg daily and she will follow-up with us  in a few months to recheck lipids.  Also discussed supplementation with co-Q10 to see if this helps alleviate any of her side effects.

## 2024-12-15 NOTE — Progress Notes (Signed)
 "  Lindsay Reyes is a 74 y.o. female who presents today for an office visit.  Assessment/Plan:  New/Acute Problems: Chest Pain  Symptoms have resolved.  She was in the hospital a couple months ago with chest pain.  Had complete workup there which was unremarkable though history does sound like she may have experienced an esophageal spasm.  She has not had any recurrence since then.  She will let us  know if she has any recurrence.  We discussed reasons to return to care and seek emergent care.  Elevated blood pressure reading Initially elevated but at goal on recheck.  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  She did see neurology for this a couple of years ago and was diagnosed with tics.  She has recently noticed that symptoms seem to be much worsened after taking the Lipitor and would like to decrease the dose.  Will go to 5 mg daily and she will follow-up with us  in a few months.  Dyslipidemia She is on Lipitor 20 mg daily but is concerned that this is worsening her involuntary movements and takes in her eyes and face.  She would like to decrease the dose of Lipitor to see if this will help with her symptoms.  Will go to 5 mg daily and she will follow-up with us  in a few months to recheck lipids.  Also discussed supplementation with co-Q10 to see if this helps alleviate any of her side effects.     Subjective:  HPI:  See assessment / plan for status of chronic conditions.    Discussed the use of AI scribe software for clinical note transcription with the patient, who gave verbal consent to proceed.  History of Present Illness Lindsay Reyes is a 74 year old female who presents with eye symptoms potentially related to cholesterol medication use.  She experiences intensified eye symptoms, including heaviness and difficulty opening her eyes, which she associates with her cholesterol medication. These symptoms are less severe when she  does not take the medication, allowing her to go almost a whole day before symptoms start. However, the symptoms still occur without the medication, just not as quickly.  The eye symptoms hinder her ability to drive, with her left eye feeling heavy and sometimes drooping. The symptoms can be exacerbated by activities such as watching TV or using her phone, where she struggles to open her eyes.  She has previously consulted a neurologist who suggested Botox, which she declined. She has been on a 20 mg dose of cholesterol medication, which she believes worsens her symptoms. She has researched the medication and found that it can cause eye twitching.  She recalls a recent episode where she experienced intense jaw pain, leading to a hospital visit where a cardiac workup was performed, but no cardiac issues were found. She recalls being told she had a little pneumonia during her hospital visit, but no treatment was provided. She sometimes experiences a dry cough and can hear something when she coughs, although she feels fine currently.         Objective:  Physical Exam: BP 122/82   Pulse 91   Temp 97.7 F (36.5 C)   Ht 5' 3.5 (1.613 m)   Wt 135 lb (61.2 kg)   SpO2 97%   BMI 23.54 kg/m   Gen: No acute distress, resting comfortably CV: Regular rate and rhythm with no murmurs appreciated Pulm: Normal work of breathing, clear to auscultation bilaterally with  no crackles, wheezes, or rhonchi Neuro: Grossly normal, moves all extremities Psych: Normal affect and thought content      Ingra Rother M. Kennyth, MD 12/15/2024 1:38 PM  "

## 2024-12-15 NOTE — Assessment & Plan Note (Signed)
 This has been an ongoing issue.  She did see neurology for this a couple of years ago and was diagnosed with tics.  She has recently noticed that symptoms seem to be much worsened after taking the Lipitor and would like to decrease the dose.  Will go to 5 mg daily and she will follow-up with us  in a few months.

## 2024-12-19 NOTE — Telephone Encounter (Signed)
 I looked at account as I called patient.  She paid balance on 12/18/24.  No further needed.

## 2024-12-29 ENCOUNTER — Other Ambulatory Visit: Payer: Self-pay | Admitting: Family Medicine

## 2025-01-05 ENCOUNTER — Ambulatory Visit: Payer: Self-pay

## 2025-01-05 NOTE — Telephone Encounter (Signed)
 FYI Only or Action Required?: Action required by provider: request for appointment, update on patient condition, and ER Refusal.  Patient was last seen in primary care on 12/15/2024 by Kennyth Worth HERO, MD.  Called Nurse Triage reporting Urinary Retention.  Symptoms began a week ago.  Interventions attempted: Rest, hydration, or home remedies.  Symptoms are: better now but off and on.  Triage Disposition: Go to ED Now (Notify PCP)  Patient/caregiver understands and will follow disposition?: No, wishes to speak with PCP            Copied from CRM #[id number]. Topic: Clinical - Medical Advice >> Jan 05, 2025  1:21 PM Delon T wrote: Reason for CRM: not sure if having kidney infection or kidney stone, have pressure but not getting anything out, it comes and goes and only one time two days ago had a very little spot of blood on the toilet paper- please call [phone] Reason for Disposition  [1] Unable to urinate (or only a few drops) > 4 hours AND [2] bladder feels very full (e.g., palpable bladder or strong urge to urinate)  Answer Assessment - Initial Assessment Questions Patient states that she went to the bathroom and was able to urinate But she suspected a possible kidney stone ---saw a small amount of blood a week ago on toilet paper when wiping a few days ago She state that she has off and on times where she is unable to urinate & feel like  She states it wasn't a full stream Patient states a week ago she went almost 2 days without urinating Then she was able to urinate and had no problems until today: She states that today   She denies fever, chills, chest pain, difficulty breathing, nausea, vomiting, denies  She states she passed a kidney stone a few months ago---she went to the ER at that time due to the pain  Patient states that she feels good now She states she was able to urinate to alleviate the pressure She hadnt gone to be checked out yet due to the recent  weather She states that she won't know if she has trouble urinating until she has to urinate again She wants to wait and see her provider after this weather happens She states she is worried that it may happen again where she cannot urinate She states today's episode was maybe less than 2 hours She states that if it happens where she cannot urinate at all she will go to the nearby Emergency Room  Patient is advised to call us  back if anything changes or with any further questions/concerns. Patient is advised that if anything worsens to go to the Emergency Room. Patient verbalized understanding.  Protocols used: Urinary Symptoms-A-AH

## 2025-01-05 NOTE — Telephone Encounter (Signed)
 Noted.

## 2025-01-05 NOTE — Telephone Encounter (Signed)
 Called [facility] and advised Therisa of patient's symptoms and not wanting to go to the ER at this time

## 2025-04-12 ENCOUNTER — Ambulatory Visit

## 2025-05-11 ENCOUNTER — Ambulatory Visit: Admitting: Physician Assistant

## 2025-07-04 ENCOUNTER — Ambulatory Visit: Admitting: Physician Assistant

## 2025-08-21 ENCOUNTER — Encounter: Admitting: Family Medicine
